# Patient Record
Sex: Male | Born: 1945 | Race: White | Hispanic: No | Smoking: Current every day smoker
Health system: Southern US, Community
[De-identification: ages and names within clinical notes are randomized; demographics above are authoritative.]

## PROBLEM LIST (undated history)

## (undated) DIAGNOSIS — G43909 Migraine, unspecified, not intractable, without status migrainosus: Secondary | ICD-10-CM

## (undated) DIAGNOSIS — K08199 Complete loss of teeth due to other specified cause, unspecified class: Secondary | ICD-10-CM

## (undated) DIAGNOSIS — I2699 Other pulmonary embolism without acute cor pulmonale: Secondary | ICD-10-CM

## (undated) DIAGNOSIS — I00 Rheumatic fever without heart involvement: Secondary | ICD-10-CM

## (undated) DIAGNOSIS — M779 Enthesopathy, unspecified: Secondary | ICD-10-CM

## (undated) DIAGNOSIS — M719 Bursopathy, unspecified: Secondary | ICD-10-CM

## (undated) DIAGNOSIS — Z8679 Personal history of other diseases of the circulatory system: Secondary | ICD-10-CM

## (undated) DIAGNOSIS — H269 Unspecified cataract: Secondary | ICD-10-CM

## (undated) DIAGNOSIS — N200 Calculus of kidney: Secondary | ICD-10-CM

## (undated) DIAGNOSIS — R17 Unspecified jaundice: Secondary | ICD-10-CM

## (undated) HISTORY — DX: Unspecified jaundice: R17

## (undated) HISTORY — DX: Personal history of other diseases of the circulatory system: Z86.79

## (undated) HISTORY — PX: ABDOMINAL SURGERY: SHX537

## (undated) HISTORY — PX: EYE SURGERY: SHX253

## (undated) HISTORY — DX: Unspecified cataract: H26.9

## (undated) HISTORY — DX: Calculus of kidney: N20.0

## (undated) HISTORY — DX: Other pulmonary embolism without acute cor pulmonale: I26.99

## (undated) HISTORY — PX: CHOLECYSTECTOMY: SHX55

## (undated) HISTORY — PX: CATARACT EXTRACTION, BILATERAL: SHX1313

---

## 1999-11-03 ENCOUNTER — Emergency Department (HOSPITAL_COMMUNITY): Admission: EM | Admit: 1999-11-03 | Discharge: 1999-11-03 | Payer: Self-pay | Admitting: Emergency Medicine

## 1999-11-04 ENCOUNTER — Inpatient Hospital Stay (HOSPITAL_COMMUNITY): Admission: EM | Admit: 1999-11-04 | Discharge: 1999-11-09 | Payer: Self-pay | Admitting: Emergency Medicine

## 1999-11-04 ENCOUNTER — Encounter: Payer: Self-pay | Admitting: Otolaryngology

## 2004-12-06 ENCOUNTER — Ambulatory Visit (HOSPITAL_COMMUNITY): Admission: RE | Admit: 2004-12-06 | Discharge: 2004-12-06 | Payer: Self-pay | Admitting: Family Medicine

## 2007-04-02 ENCOUNTER — Ambulatory Visit (HOSPITAL_COMMUNITY): Admission: RE | Admit: 2007-04-02 | Discharge: 2007-04-02 | Payer: Self-pay | Admitting: Family Medicine

## 2007-04-13 ENCOUNTER — Ambulatory Visit (HOSPITAL_COMMUNITY): Admission: RE | Admit: 2007-04-13 | Discharge: 2007-04-13 | Payer: Self-pay | Admitting: Urology

## 2007-04-27 ENCOUNTER — Ambulatory Visit (HOSPITAL_COMMUNITY): Admission: RE | Admit: 2007-04-27 | Discharge: 2007-04-27 | Payer: Self-pay | Admitting: Urology

## 2007-06-02 ENCOUNTER — Ambulatory Visit (HOSPITAL_COMMUNITY): Admission: RE | Admit: 2007-06-02 | Discharge: 2007-06-02 | Payer: Self-pay | Admitting: Urology

## 2007-06-04 ENCOUNTER — Ambulatory Visit (HOSPITAL_COMMUNITY): Admission: RE | Admit: 2007-06-04 | Discharge: 2007-06-04 | Payer: Self-pay | Admitting: Urology

## 2007-06-18 ENCOUNTER — Ambulatory Visit (HOSPITAL_COMMUNITY): Admission: RE | Admit: 2007-06-18 | Discharge: 2007-06-18 | Payer: Self-pay | Admitting: Urology

## 2008-06-02 ENCOUNTER — Ambulatory Visit (HOSPITAL_COMMUNITY): Admission: RE | Admit: 2008-06-02 | Discharge: 2008-06-02 | Payer: Self-pay | Admitting: Family Medicine

## 2011-03-18 NOTE — H&P (Signed)
Brian Stark, Brian Stark               ACCOUNT NO.:  [id number]   MEDICAL RECORD NO.:  [id number]          PATIENT TYPE:  AMB   LOCATION:  DAY                           FACILITY:  [facility]   PHYSICIAN:  Dennie Maizes, M.D.   DATE OF BIRTH:  [birth year]   DATE OF ADMISSION:  06/02/2007  DATE OF DISCHARGE:  LH                              HISTORY & PHYSICAL   CHIEF COMPLAINT:  Intermittent hematuria, left renal calculus.   HISTORY OF PRESENT ILLNESS:  This 65 year old male was referred to me by  Dr. Renard Matter.  He had been having intermittent mild hematuria since  August 2007.  He was seen in the office in June 2008.  Complete  evaluation was done for hematuria workup.  CT scan of the abdomen and  pelvis revealed a 7 x 5 mm size left renal calculus without obstruction.  Cystoscopy was negative for bladder lesions.  The patient is brought to  the short stay center today for extracorporeal shock wave lithotripsy of  a large left renal calculus.   He has occasional dysuria.  He has urinary frequency q.2 h., and  nocturia q.1 h.  He has good urinary flow.  There is nohematuria.Marland Kitchen   PAST MEDICAL HISTORY:  1. Rheumatic fever as a child.  The patient does not have rheumatic      heart disease.  2. History of epistaxis.  3. Cholelithiasis and choledocholithiasis, status post      cholecystectomy, status post choledocholithotomy.   MEDICATIONS:  None.   ALLERGIES:  FLOXIN.   FAMILY HISTORY:  Positive for heart disease, prostate disease.   PHYSICAL EXAMINATION:  HEENT:  Normal.  NECK:  No masses.  LUNGS:  Clear to auscultation.  HEART:  Regular rate and rhythm.  No murmurs.  ABDOMEN:  Soft.  No palpable flank mass.  No costovertebral angle  tenderness.  Bladder not palpable.  GU:  Penis and testes are normal.  RECTAL:  35 g, benign prostate.   IMPRESSION:  Left ureteral calculus, hematuria.   PLAN:  Extracorporeal shock wave lithotripsy of left distal ureteral  calculus with IV sedation in  day hospital.  I have informed the patient  regarding the diagnosis, operative details, alternative treatments,  outcome, possible risks and complications, and he has agreed for the  procedure to be done.      Dennie Maizes, M.D.  Electronically Signed     SK/MEDQ  D:  06/02/2007  T:  06/02/2007  Job:  [id number]   cc:   Angus G. Renard Matter, MD  Fax: [phone]

## 2014-08-25 ENCOUNTER — Other Ambulatory Visit (HOSPITAL_COMMUNITY): Payer: Self-pay | Admitting: Family Medicine

## 2014-08-25 ENCOUNTER — Ambulatory Visit (HOSPITAL_COMMUNITY)
Admission: RE | Admit: 2014-08-25 | Discharge: 2014-08-25 | Disposition: A | Discharge status: Home / Self Care | Payer: Medicare Other | Source: Ambulatory Visit | Attending: Family Medicine | Admitting: Family Medicine

## 2014-08-25 DIAGNOSIS — M25512 Pain in left shoulder: Secondary | ICD-10-CM | POA: Diagnosis present

## 2014-11-02 ENCOUNTER — Other Ambulatory Visit (HOSPITAL_COMMUNITY): Payer: Self-pay | Admitting: Family Medicine

## 2014-11-02 DIAGNOSIS — M25512 Pain in left shoulder: Secondary | ICD-10-CM

## 2014-11-09 ENCOUNTER — Ambulatory Visit (HOSPITAL_COMMUNITY)
Admission: RE | Admit: 2014-11-09 | Discharge: 2014-11-09 | Disposition: A | Discharge status: Home / Self Care | Payer: Medicare Other | Source: Ambulatory Visit | Attending: Family Medicine | Admitting: Family Medicine

## 2014-11-09 DIAGNOSIS — M25512 Pain in left shoulder: Secondary | ICD-10-CM | POA: Diagnosis not present

## 2014-11-09 DIAGNOSIS — M779 Enthesopathy, unspecified: Secondary | ICD-10-CM | POA: Insufficient documentation

## 2014-11-09 DIAGNOSIS — R531 Weakness: Secondary | ICD-10-CM | POA: Insufficient documentation

## 2014-12-18 ENCOUNTER — Ambulatory Visit: Payer: Medicare Other | Admitting: Orthopedic Surgery

## 2015-01-02 ENCOUNTER — Ambulatory Visit (INDEPENDENT_AMBULATORY_CARE_PROVIDER_SITE_OTHER): Payer: Medicare Other | Admitting: Orthopedic Surgery

## 2015-01-02 VITALS — BP 124/77 | Ht 70.0 in | Wt 165.0 lb

## 2015-01-02 DIAGNOSIS — M7552 Bursitis of left shoulder: Secondary | ICD-10-CM

## 2015-01-02 DIAGNOSIS — M75102 Unspecified rotator cuff tear or rupture of left shoulder, not specified as traumatic: Secondary | ICD-10-CM

## 2015-01-02 NOTE — Progress Notes (Signed)
Brian Stark is a 69 y.o. male   Chief Complaint  Patient presents with  . Shoulder Pain    left shoulder pain, DOI 09/02/13 FALL    The patient has a history of shoulder pain since October 2014 he says I wonder if it's 2015. He does have a history of a fall back at that time and current symptoms include pain and locking of the left shoulder. Pain is described as dull, throbbing, aching and radiating into his left upper extremity. Constant 6 out of 10 pain he's had x-ray and MRI did not show tear just some bursitis and before meals joint arthrosis. Pain is unrelieved by [facility] powders and 800 mg ibuprofen. Pain increases with activity such as lifting his arm over his head during [employer] sleeping driving and cold temperatures.  I reviewed his MRI and x-ray he does not have a cuff tear. Tendinitis rotator cuff and tendinopathy noted. Bursitis.  System review reveals dental problems fatigue cold intolerance excessive nighttime urination bladder control issues frequent urination joint pain and back pain  No known drug allergies  Medical history of asthma pneumonia liver disease heart disease kidney disease rheumatic fever  Surgery 1981 had gallbladder surgery for stones 1957 had plastic surgery 2003 had kidney stones pulverized  Medications recorded  Social history smoke 1 pack per day beer daily retired no street drugs  Family history diabetes asthma heart disease cancer arthritis osteoporosis seizures    Shoulder Pain: No past medical history on file. No past surgical history on file.  Current outpatient prescriptions:  .  Aspirin-Salicylamide-Caffeine (BC HEADACHE POWDER PO), Take by mouth., Disp: , Rfl:  .  ibuprofen (ADVIL,MOTRIN) 800 MG tablet, Take 800 mg by mouth every 8 (eight) hours as needed., Disp: , Rfl:  .  tadalafil (CIALIS) 20 MG tablet, Take 20 mg by mouth daily as needed for erectile dysfunction., Disp: , Rfl:  No Known Allergies   No family history on  file.   BP 124/77 mmHg  Ht  (1.778 m)  Wt 165 lb (74.844 kg)  BMI 23.68 kg/m2 9awake alert oriented 3 appearance normal sprain small mood normal affect normal gait station normal  Right Shoulder: Inspection reveals no abnormalities, atrophy or asymmetry. Palpation is normal with no tenderness over AC joint or bicipital groove. ROM is full in all planes. Rotator cuff strength normal throughout. No signs of impingement with negative Neer and Hawkin's tests, empty can. Speeds and Yergason's tests normal. No labral pathology noted with negative Obrien's, negative clunk and good stability. Normal scapular function observed. No painful arc and no drop arm sign. No apprehension sign.  Left shoulder Tenderness in the peri-acromial region especially in the rotator interval and posterior subacromial space. Full range of motion some tightness with internal rotation but it matches his opposite side for [facility] elevation with grade 5 strength in all muscles of the rotator cuff. Apprehens Skin normal. Pulse normal. Sensation normal.  Lymph nodes normal both arms.   Diagnosis left rotator cuff syndrome  Recommend subacromial injection and physical therapy at home  Procedure note the subacromial injection shoulder left   Verbal consent was obtained to inject the  Left   Shoulder  Timeout was completed to confirm the injection site is a subacromial space of the  left  shoulder  Medication used Depo-Medrol 40 mg and lidocaine 1% 3 cc  Anesthesia was provided by ethyl chloride  The injection was performed in the left  posterior subacromial space. After pinning the  skin with alcohol and anesthetized the skin with ethyl chloride the subacromial space was injected using a 20-gauge needle. There were no complications  Sterile dressing was applied.   If he does not feel relief after 2 weeks he is to call to get a repeat injection. Surgery not needed        Subacromial Shoulder  Injection Procedure Note  Pre-operative Diagnosis: left subacromial rotator cuff syndrome  Post-operative Diagnosis: same  Indications: Pain   Complications:  None; patient tolerated the procedure well.

## 2015-01-02 NOTE — Patient Instructions (Signed)
Home exercises   Joint Injection Care After Refer to this sheet in the next few days. These instructions provide you with information on caring for yourself after you have had a joint injection. Your caregiver also may give you more specific instructions. Your treatment has been planned according to current medical practices, but problems sometimes occur. Call your caregiver if you have any problems or questions after your procedure. After any type of joint injection, it is not uncommon to experience:  Soreness, swelling, or bruising around the injection site.  Mild numbness, tingling, or weakness around the injection site caused by the numbing medicine used before or with the injection. It also is possible to experience the following effects associated with the specific agent after injection:  Iodine-based contrast agents:  Allergic reaction (itching, hives, widespread redness, and swelling beyond the injection site).  Corticosteroids (These effects are rare.):  Allergic reaction.  Increased blood sugar levels (If you have diabetes and you notice that your blood sugar levels have increased, notify your caregiver).  Increased blood pressure levels.  Mood swings.  Hyaluronic acid in the use of viscosupplementation.  Temporary heat or redness.  Temporary rash and itching.  Increased fluid accumulation in the injected joint. These effects all should resolve within a day after your procedure.  HOME CARE INSTRUCTIONS  Limit yourself to light activity the day of your procedure. Avoid lifting heavy objects, bending, stooping, or twisting.  Take prescription or over-the-counter pain medication as directed by your caregiver.  You may apply ice to your injection site to reduce pain and swelling the day of your procedure. Ice may be applied 03-04 times:  Put ice in a plastic bag.  Place a towel between your skin and the bag.  Leave the ice on for no longer than 15-20 minutes each  time. SEEK IMMEDIATE MEDICAL CARE IF:   Pain and swelling get worse rather than better or extend beyond the injection site.  Numbness does not go away.  Blood or fluid continues to leak from the injection site.  You have chest pain.  You have swelling of your face or tongue.  You have trouble breathing or you become dizzy.  You develop a fever, chills, or severe tenderness at the injection site that last longer than 1 day. MAKE SURE YOU:  Understand these instructions.  Watch your condition.  Get help right away if you are not doing well or if you get worse. Document Released: 07/03/2011 Document Revised: 01/12/2012 Document Reviewed: 07/03/2011 Exit[employer] Patient Information 2015 [employer]. This information is not intended to replace advice given to you by your health care provider. Make sure you discuss any questions you have with your health care provider.  

## 2015-01-23 ENCOUNTER — Ambulatory Visit (INDEPENDENT_AMBULATORY_CARE_PROVIDER_SITE_OTHER): Payer: Medicare Other | Admitting: Orthopedic Surgery

## 2015-01-23 VITALS — BP 137/78 | Ht 70.0 in | Wt 165.0 lb

## 2015-01-23 DIAGNOSIS — M7552 Bursitis of left shoulder: Secondary | ICD-10-CM

## 2015-01-23 NOTE — Progress Notes (Signed)
Follow-up visit  This 69 year old male fell on October 2014 eventually had an MRI and x-ray of his left shoulder which showed he had bursitis without rotator cuff tear I gave him an injection last visit on I believe March 1 he got partial relief then he had to drive his [place] in his pain started to worsen again especially last Friday when it was almost unbearable  He presents for repeat injection    site confirmation was confirmed subacromial space left shoulder was injected  Procedure note the subacromial injection shoulder left   Verbal consent was obtained to inject the  Left   Shoulder  Timeout was completed to confirm the injection site is a subacromial space of the  left  shoulder  Medication used Depo-Medrol 40 mg and lidocaine 1% 3 cc  Anesthesia was provided by ethyl chloride  The injection was performed in the left  posterior subacromial space. After pinning the skin with alcohol and anesthetized the skin with ethyl chloride the subacromial space was injected using a 20-gauge needle. There were no complications  Sterile dressing was applied.

## 2017-05-08 ENCOUNTER — Other Ambulatory Visit (HOSPITAL_COMMUNITY): Payer: Self-pay | Admitting: Internal Medicine

## 2017-05-08 DIAGNOSIS — R160 Hepatomegaly, not elsewhere classified: Secondary | ICD-10-CM

## 2017-05-25 ENCOUNTER — Ambulatory Visit (HOSPITAL_COMMUNITY)
Admission: RE | Admit: 2017-05-25 | Discharge: 2017-05-25 | Disposition: A | Discharge status: Home / Self Care | Payer: Medicare Other | Source: Ambulatory Visit | Attending: Internal Medicine | Admitting: Internal Medicine

## 2017-05-25 DIAGNOSIS — K838 Other specified diseases of biliary tract: Secondary | ICD-10-CM | POA: Diagnosis not present

## 2017-05-25 DIAGNOSIS — Z9049 Acquired absence of other specified parts of digestive tract: Secondary | ICD-10-CM | POA: Diagnosis not present

## 2017-05-25 DIAGNOSIS — R160 Hepatomegaly, not elsewhere classified: Secondary | ICD-10-CM | POA: Diagnosis present

## 2018-05-21 ENCOUNTER — Other Ambulatory Visit: Payer: Self-pay | Admitting: Internal Medicine

## 2018-05-21 DIAGNOSIS — R51 Headache: Secondary | ICD-10-CM

## 2018-05-21 DIAGNOSIS — G43911 Migraine, unspecified, intractable, with status migrainosus: Secondary | ICD-10-CM

## 2018-05-21 DIAGNOSIS — G43009 Migraine without aura, not intractable, without status migrainosus: Secondary | ICD-10-CM

## 2018-05-21 DIAGNOSIS — R519 Headache, unspecified: Secondary | ICD-10-CM

## 2018-05-24 ENCOUNTER — Encounter (HOSPITAL_COMMUNITY): Payer: Self-pay

## 2018-05-24 ENCOUNTER — Ambulatory Visit (HOSPITAL_COMMUNITY)
Admission: RE | Admit: 2018-05-24 | Discharge: 2018-05-24 | Disposition: A | Discharge status: Home / Self Care | Payer: Medicare Other | Source: Ambulatory Visit | Attending: Internal Medicine | Admitting: Internal Medicine

## 2018-05-24 DIAGNOSIS — I6782 Cerebral ischemia: Secondary | ICD-10-CM | POA: Diagnosis not present

## 2018-05-24 DIAGNOSIS — G43009 Migraine without aura, not intractable, without status migrainosus: Secondary | ICD-10-CM | POA: Diagnosis not present

## 2018-05-24 DIAGNOSIS — G3189 Other specified degenerative diseases of nervous system: Secondary | ICD-10-CM | POA: Diagnosis not present

## 2018-05-24 DIAGNOSIS — R51 Headache: Secondary | ICD-10-CM

## 2018-05-24 DIAGNOSIS — R519 Headache, unspecified: Secondary | ICD-10-CM

## 2018-05-24 LAB — POCT I-STAT CREATININE: CREATININE: 1.6 mg/dL — AB (ref 0.61–1.24)

## 2018-05-24 MED ORDER — IOHEXOL 300 MG/ML  SOLN
75.0000 mL | Freq: Once | INTRAMUSCULAR | Status: AC | PRN
  Administered 2018-05-24: 60 mL via INTRAVENOUS

## 2018-09-05 ENCOUNTER — Encounter (HOSPITAL_COMMUNITY): Payer: Self-pay | Admitting: Emergency Medicine

## 2018-09-05 ENCOUNTER — Other Ambulatory Visit: Payer: Self-pay

## 2018-09-05 ENCOUNTER — Inpatient Hospital Stay (HOSPITAL_COMMUNITY)
Admission: EM | Admit: 2018-09-05 | Discharge: 2018-09-16 | DRG: 025 | Disposition: A | Discharge status: Skilled Nursing Facility | Payer: Medicare Other | Attending: Neurosurgery | Admitting: Neurosurgery

## 2018-09-05 ENCOUNTER — Emergency Department (HOSPITAL_COMMUNITY): Payer: Medicare Other

## 2018-09-05 DIAGNOSIS — R51 Headache: Secondary | ICD-10-CM | POA: Diagnosis not present

## 2018-09-05 DIAGNOSIS — G935 Compression of brain: Secondary | ICD-10-CM | POA: Diagnosis present

## 2018-09-05 DIAGNOSIS — W1830XA Fall on same level, unspecified, initial encounter: Secondary | ICD-10-CM | POA: Diagnosis present

## 2018-09-05 DIAGNOSIS — G8194 Hemiplegia, unspecified affecting left nondominant side: Secondary | ICD-10-CM | POA: Diagnosis present

## 2018-09-05 DIAGNOSIS — S065X9A Traumatic subdural hemorrhage with loss of consciousness of unspecified duration, initial encounter: Principal | ICD-10-CM | POA: Diagnosis present

## 2018-09-05 DIAGNOSIS — S065XAA Traumatic subdural hemorrhage with loss of consciousness status unknown, initial encounter: Secondary | ICD-10-CM | POA: Diagnosis present

## 2018-09-05 DIAGNOSIS — H5702 Anisocoria: Secondary | ICD-10-CM | POA: Diagnosis present

## 2018-09-05 DIAGNOSIS — F17200 Nicotine dependence, unspecified, uncomplicated: Secondary | ICD-10-CM | POA: Diagnosis present

## 2018-09-05 DIAGNOSIS — I6200 Nontraumatic subdural hemorrhage, unspecified: Secondary | ICD-10-CM | POA: Diagnosis present

## 2018-09-05 HISTORY — DX: Bursopathy, unspecified: M71.9

## 2018-09-05 HISTORY — DX: Rheumatic fever without heart involvement: I00

## 2018-09-05 HISTORY — DX: Enthesopathy, unspecified: M77.9

## 2018-09-05 HISTORY — DX: Migraine, unspecified, not intractable, without status migrainosus: G43.909

## 2018-09-05 LAB — COMPREHENSIVE METABOLIC PANEL
ALT: 14 U/L (ref 0–44)
ANION GAP: 8 (ref 5–15)
AST: 28 U/L (ref 15–41)
Albumin: 3.8 g/dL (ref 3.5–5.0)
Alkaline Phosphatase: 82 U/L (ref 38–126)
BUN: 10 mg/dL (ref 8–23)
CHLORIDE: 107 mmol/L (ref 98–111)
CO2: 25 mmol/L (ref 22–32)
Calcium: 8.8 mg/dL — ABNORMAL LOW (ref 8.9–10.3)
Creatinine, Ser: 1.33 mg/dL — ABNORMAL HIGH (ref 0.61–1.24)
GFR, EST NON AFRICAN AMERICAN: 52 mL/min — AB (ref >60)
Glucose, Bld: 94 mg/dL (ref 70–99)
POTASSIUM: 3.7 mmol/L (ref 3.5–5.1)
Sodium: 140 mmol/L (ref 135–145)
TOTAL PROTEIN: 6.7 g/dL (ref 6.5–8.1)
Total Bilirubin: 1.1 mg/dL (ref 0.3–1.2)

## 2018-09-05 LAB — URINALYSIS, MICROSCOPIC (REFLEX)
Bacteria, UA: NONE SEEN
RBC / HPF: NONE SEEN RBC/hpf (ref 0–5)
SQUAMOUS EPITHELIAL / LPF: NONE SEEN (ref 0–5)
WBC UA: NONE SEEN WBC/hpf (ref 0–5)

## 2018-09-05 LAB — CBC WITH DIFFERENTIAL/PLATELET
Abs Immature Granulocytes: 0.02 10*3/uL (ref 0.00–0.07)
Basophils Absolute: 0 10*3/uL (ref 0.0–0.1)
Basophils Relative: 1 %
EOS PCT: 0 %
Eosinophils Absolute: 0 10*3/uL (ref 0.0–0.5)
HCT: 36.2 % — ABNORMAL LOW (ref 39.0–52.0)
Hemoglobin: 11.7 g/dL — ABNORMAL LOW (ref 13.0–17.0)
IMMATURE GRANULOCYTES: 0 %
Lymphocytes Relative: 8 %
Lymphs Abs: 0.5 10*3/uL — ABNORMAL LOW (ref 0.7–4.0)
MCH: 34.6 pg — ABNORMAL HIGH (ref 26.0–34.0)
MCHC: 32.3 g/dL (ref 30.0–36.0)
MCV: 107.1 fL — AB (ref 80.0–100.0)
MONOS PCT: 10 %
Monocytes Absolute: 0.7 10*3/uL (ref 0.1–1.0)
NEUTROS PCT: 81 %
NRBC: 0 % (ref 0.0–0.2)
Neutro Abs: 5.1 10*3/uL (ref 1.7–7.7)
PLATELETS: 84 10*3/uL — AB (ref 150–400)
RBC: 3.38 MIL/uL — ABNORMAL LOW (ref 4.22–5.81)
RDW: 13.9 % (ref 11.5–15.5)
WBC: 6.3 10*3/uL (ref 4.0–10.5)

## 2018-09-05 LAB — URINALYSIS, ROUTINE W REFLEX MICROSCOPIC
Bilirubin Urine: NEGATIVE
Glucose, UA: NEGATIVE mg/dL
Ketones, ur: NEGATIVE mg/dL
Leukocytes, UA: NEGATIVE
NITRITE: NEGATIVE
Protein, ur: NEGATIVE mg/dL
pH: 6 (ref 5.0–8.0)

## 2018-09-05 LAB — APTT: aPTT: 26 seconds (ref 24–36)

## 2018-09-05 LAB — PROTIME-INR
INR: 1.04
Prothrombin Time: 13.5 seconds (ref 11.4–15.2)

## 2018-09-05 LAB — ETHANOL: Alcohol, Ethyl (B): <10 mg/dL (ref <10)

## 2018-09-05 MED ORDER — SODIUM CHLORIDE 0.9 % IV BOLUS
500.0000 mL | Freq: Once | INTRAVENOUS | Status: AC
  Administered 2018-09-05: 500 mL via INTRAVENOUS

## 2018-09-05 NOTE — ED Triage Notes (Signed)
Someone found pt lying beside his car, pt told EMS that he has been getting dizzy the past 2 days and fell. Was out in cold approx 35 degress for about 20-30 min

## 2018-09-05 NOTE — ED Notes (Signed)
Per EDP pt is to go straight to OR for intervention

## 2018-09-05 NOTE — ED Notes (Signed)
Pt states he has been getting dizzy with standing for past few days. Denies dizziness with sitting. Pt states he drinks 2 beers a night.

## 2018-09-05 NOTE — ED Notes (Signed)
Been kept NPO whole ED visit.

## 2018-09-05 NOTE — ED Provider Notes (Signed)
[facility] EMERGENCY DEPARTMENT Provider Note   CSN: [id number] Arrival date & time: 09/05/18  2140     History   Chief Complaint Chief Complaint  Patient presents with  . Fall  . Dizziness    HPI Brian Stark is a 72 y.o. male.  HPI Patient states he has had increased urinary frequency for the last few days.  This evening had roughly 3 episodes of his legs giving out.  Denied dizziness or lightheadedness.  Denied loss of consciousness.  Thinks he may have hit his head during 1 of the falls.  Denies neck pain.  No focal weakness or numbness.  Denies chest pain or shortness of breath.  No abdominal pain, nausea or vomiting.  No recent fever or chills. Past Medical History:  Diagnosis Date  . Bursitis    l shoulder  . Migraines   . Rheumatic fever   . Tendonitis    L shoulder    There are no active problems to display for this patient.   Past Surgical History:  Procedure Laterality Date  . ABDOMINAL SURGERY    . CATARACT EXTRACTION, BILATERAL    . CHOLECYSTECTOMY    . EYE SURGERY          Home Medications    Prior to Admission medications   Medication Sig Start Date End Date Taking? Authorizing Provider  Aspirin-Salicylamide-Caffeine (BC HEADACHE POWDER PO) Take 1 Package by mouth every 6 (six) hours.    Yes [provider]  cyanocobalamin (,VITAMIN B-12,) 1000 MCG/ML injection Inject 1,000 mcg into the muscle every 30 (thirty) days.   Yes [provider]  Homeopathic Products (LEG CRAMP RELIEF PO) Take 1 tablet by mouth daily as needed (Leg cramp).   Yes [provider]  ibuprofen (ADVIL,MOTRIN) 800 MG tablet Take 800 mg by mouth every 8 (eight) hours as needed.   Yes [provider]  Polyethyl Glycol-Propyl Glycol 0.4-0.3 % SOLN Apply 1 drop to eye.   Yes [provider]  tadalafil (CIALIS) 20 MG tablet Take 20 mg by mouth daily as needed for erectile dysfunction.   Yes [provider]    Family  History History reviewed. No pertinent family history.  Social History Social History   Tobacco Use  . Smoking status: Current Every Day Smoker  . Smokeless tobacco: Never Used  Substance Use Topics  . Alcohol use: Yes    Comment: daily-beer 2 cans  . Drug use: Never     Allergies   Flucloxacillin   Review of Systems Review of Systems  Constitutional: Negative for chills and fever.  HENT: Negative for trouble swallowing.   Eyes: Negative for visual disturbance.  Respiratory: Negative for cough and shortness of breath.   Cardiovascular: Negative for chest pain, palpitations and leg swelling.  Gastrointestinal: Negative for abdominal pain, constipation, diarrhea, nausea and vomiting.  Genitourinary: Positive for frequency. Negative for dysuria, flank pain and hematuria.  Musculoskeletal: Negative for back pain, myalgias and neck pain.  Skin: Negative for rash and wound.  Neurological: Positive for weakness. Negative for dizziness, syncope, speech difficulty, light-headedness, numbness and headaches.  All other systems reviewed and are negative.    Physical Exam Updated Vital Signs BP (!) 148/94   Pulse 87   Temp 98.2 F (36.8 C) (Oral)   Resp (!) 22   SpO2 97%   Physical Exam  Constitutional: He is oriented to person, place, and time. He appears well-developed and well-nourished. No distress.  HENT:  Head: Normocephalic  and atraumatic.  Mouth/Throat: Oropharynx is clear and moist.  No obvious head injury.  No intraoral injury.  Patient has right eye ptosis  Eyes: Pupils are equal, round, and reactive to light. EOM are normal.  Neck: Normal range of motion. Neck supple.  No posterior midline cervical tenderness to palpation.  Cardiovascular: Normal rate and regular rhythm. Exam reveals no gallop and no friction rub.  No murmur heard. Pulmonary/Chest: Effort normal and breath sounds normal. No stridor. No respiratory distress. He has no wheezes. He has no rales. He  exhibits no tenderness.  Abdominal: Soft. Bowel sounds are normal. There is no tenderness. There is no rebound and no guarding.  Musculoskeletal: Normal range of motion. He exhibits no edema or tenderness.  Neurological: He is alert and oriented to person, place, and time.  Patient is alert and oriented x3 with clear, goal oriented speech. Patient has 5/5 motor in all extremities. Sensation is intact to light touch. Bilateral finger-to-nose is normal with no signs of dysmetria.   Skin: Skin is warm and dry. No rash noted. He is not diaphoretic. No erythema.  Psychiatric: He has a normal mood and affect. His behavior is normal.  Nursing note and vitals reviewed.    ED Treatments / Results  Labs (all labs ordered are listed, but only abnormal results are displayed) Labs Reviewed  CBC WITH DIFFERENTIAL/PLATELET - Abnormal; Notable for the following components:      Result Value   RBC 3.38 (*)    Hemoglobin 11.7 (*)    HCT 36.2 (*)    MCV 107.1 (*)    MCH 34.6 (*)    Platelets 84 (*)    Lymphs Abs 0.5 (*)    All other components within normal limits  COMPREHENSIVE METABOLIC PANEL - Abnormal; Notable for the following components:   Creatinine, Ser 1.33 (*)    Calcium 8.8 (*)    GFR calc non Af Amer 52 (*)    All other components within normal limits  ETHANOL  PROTIME-INR  APTT  URINALYSIS, ROUTINE W REFLEX MICROSCOPIC  RAPID URINE DRUG SCREEN, HOSP PERFORMED  TYPE AND SCREEN    EKG EKG Interpretation  Date/Time:  Sunday September 05 2018 21:50:43 EST Ventricular Rate:  85 PR Interval:    QRS Duration: 92 QT Interval:  388 QTC Calculation: 462 R Axis:   -59 Text Interpretation:  Sinus rhythm Left anterior fascicular block Abnormal R-wave progression, early transition Confirmed by Loren Racer ([id number]) on 09/05/2018 10:29:55 PM   Radiology Ct Head Wo Contrast  Result Date: 09/05/2018 CLINICAL DATA:  Fall. Dizziness. EXAM: CT HEAD WITHOUT CONTRAST TECHNIQUE: Contiguous  axial images were obtained from the base of the skull through the vertex without intravenous contrast. COMPARISON:  05/24/2018 FINDINGS: Brain: Subdural hematomas are present over both cerebral convexities measuring up to 2.6 cm in thickness on the right and 1.5 cm on the left. Layering hyperdense blood products are noted in both collections posteriorly. There is associated sulcal and partial ventricular effacement with 10 mm of leftward midline shift. No acute infarct is identified. Scattered cerebral white matter hypodensities are nonspecific but compatible with minimal chronic small vessel ischemic disease for age. Vascular: Mild calcified atherosclerosis at the skull base. No hyperdense vessel. Skull: No fracture or focal osseous lesion. Sinuses/Orbits: Visualized paranasal sinuses and mastoid air cells are clear. Bilateral cataract extraction is noted. Other: None. IMPRESSION: Bilateral subdural hematomas with 10 mm of leftward midline shift. Critical Value/emergent results were called by telephone at the time  of interpretation on 09/05/2018 at 11:11 pm to Dr. Loren Racer , who verbally acknowledged these results. Electronically Signed   By: Sebastian Ache M.D.   On: 09/05/2018 23:13    Procedures Procedures (including critical care time)  Medications Ordered in ED Medications  sodium chloride 0.9 % bolus 500 mL (500 mLs Intravenous New Bag/Given 09/05/18 2333)    CRITICAL CARE Performed by: Loren Racer Total critical care time: 35 minutes Critical care time was exclusive of separately billable procedures and treating other patients. Critical care was necessary to treat or prevent imminent or life-threatening deterioration. Critical care was time spent personally by me on the following activities: development of treatment plan with patient and/or surrogate as well as nursing, discussions with consultants, evaluation of patient's response to treatment, examination of patient, obtaining  history from patient or surrogate, ordering and performing treatments and interventions, ordering and review of laboratory studies, ordering and review of radiographic studies, pulse oximetry and re-evaluation of patient's condition. Initial Impression / Assessment and Plan / ED Course  I have reviewed the triage vital signs and the nursing notes.  Pertinent labs & imaging results that were available during my care of the patient were reviewed by me and considered in my medical decision making (see chart for details).     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  CT head with bilateral subdurals and left to right shift.  Spoke with Dr. Venetia Maxon who reviewed patient's CT.  Is planning to take the patient straight to the operating room.  Is concerned that the patient's platelets are low.  Asked to have patient typed and screened and will likely give platelets at [facility].  Discussed all this with the patient and his son.  His son is in [facility] and will drive to [employer].  Will arrange transfer. Final Clinical Impressions(s) / ED Diagnoses   Final diagnoses:  Subdural hemorrhage [facility])    ED Discharge Orders    None       Loren Racer, MD 09/05/18 2351

## 2018-09-05 NOTE — ED Notes (Signed)
Pt taken to ct 

## 2018-09-06 ENCOUNTER — Emergency Department (HOSPITAL_COMMUNITY): Payer: Medicare Other | Admitting: Certified Registered"

## 2018-09-06 ENCOUNTER — Inpatient Hospital Stay (HOSPITAL_COMMUNITY)
Admission: EM | Disposition: A | Discharge status: Skilled Nursing Facility | Payer: Self-pay | Source: Home / Self Care | Attending: Neurosurgery

## 2018-09-06 ENCOUNTER — Encounter (HOSPITAL_COMMUNITY): Payer: Self-pay | Admitting: Certified Registered"

## 2018-09-06 DIAGNOSIS — S065X9A Traumatic subdural hemorrhage with loss of consciousness of unspecified duration, initial encounter: Secondary | ICD-10-CM | POA: Diagnosis present

## 2018-09-06 DIAGNOSIS — R51 Headache: Secondary | ICD-10-CM | POA: Diagnosis present

## 2018-09-06 DIAGNOSIS — W1830XA Fall on same level, unspecified, initial encounter: Secondary | ICD-10-CM | POA: Diagnosis present

## 2018-09-06 DIAGNOSIS — G8194 Hemiplegia, unspecified affecting left nondominant side: Secondary | ICD-10-CM | POA: Diagnosis present

## 2018-09-06 DIAGNOSIS — H5702 Anisocoria: Secondary | ICD-10-CM | POA: Diagnosis present

## 2018-09-06 DIAGNOSIS — G935 Compression of brain: Secondary | ICD-10-CM | POA: Diagnosis present

## 2018-09-06 DIAGNOSIS — I6200 Nontraumatic subdural hemorrhage, unspecified: Secondary | ICD-10-CM | POA: Diagnosis present

## 2018-09-06 DIAGNOSIS — S065XAA Traumatic subdural hemorrhage with loss of consciousness status unknown, initial encounter: Secondary | ICD-10-CM | POA: Diagnosis present

## 2018-09-06 DIAGNOSIS — F17200 Nicotine dependence, unspecified, uncomplicated: Secondary | ICD-10-CM | POA: Diagnosis present

## 2018-09-06 HISTORY — PX: CRANIOTOMY: SHX93

## 2018-09-06 LAB — RAPID URINE DRUG SCREEN, HOSP PERFORMED
AMPHETAMINES: NOT DETECTED
Barbiturates: NOT DETECTED
Benzodiazepines: NOT DETECTED
COCAINE: NOT DETECTED
OPIATES: POSITIVE — AB
TETRAHYDROCANNABINOL: NOT DETECTED

## 2018-09-06 LAB — CBC
HCT: 33.3 % — ABNORMAL LOW (ref 39.0–52.0)
Hemoglobin: 10.9 g/dL — ABNORMAL LOW (ref 13.0–17.0)
MCH: 34.8 pg — ABNORMAL HIGH (ref 26.0–34.0)
MCHC: 32.7 g/dL (ref 30.0–36.0)
MCV: 106.4 fL — ABNORMAL HIGH (ref 80.0–100.0)
NRBC: 0 % (ref 0.0–0.2)
PLATELETS: 115 10*3/uL — AB (ref 150–400)
RBC: 3.13 MIL/uL — ABNORMAL LOW (ref 4.22–5.81)
RDW: 13.8 % (ref 11.5–15.5)
WBC: 5.1 10*3/uL (ref 4.0–10.5)

## 2018-09-06 LAB — BASIC METABOLIC PANEL
Anion gap: 4 — ABNORMAL LOW (ref 5–15)
BUN: 8 mg/dL (ref 8–23)
CALCIUM: 8.3 mg/dL — AB (ref 8.9–10.3)
CO2: 24 mmol/L (ref 22–32)
Chloride: 113 mmol/L — ABNORMAL HIGH (ref 98–111)
Creatinine, Ser: 1.29 mg/dL — ABNORMAL HIGH (ref 0.61–1.24)
GFR, EST NON AFRICAN AMERICAN: 54 mL/min — AB (ref >60)
Glucose, Bld: 114 mg/dL — ABNORMAL HIGH (ref 70–99)
Potassium: 3.4 mmol/L — ABNORMAL LOW (ref 3.5–5.1)
SODIUM: 141 mmol/L (ref 135–145)

## 2018-09-06 LAB — MRSA PCR SCREENING: MRSA BY PCR: NEGATIVE

## 2018-09-06 LAB — ABO/RH: ABO/RH(D): O POS

## 2018-09-06 SURGERY — CRANIOTOMY HEMATOMA EVACUATION SUBDURAL
Anesthesia: General | Site: Head | Laterality: Bilateral

## 2018-09-06 MED ORDER — ACETAMINOPHEN 650 MG RE SUPP: 650.0000 mg | RECTAL | Status: DC | PRN

## 2018-09-06 MED ORDER — ACETAMINOPHEN 325 MG PO TABS
650.0000 mg | ORAL_TABLET | ORAL | Status: DC | PRN
  Administered 2018-09-06 – 2018-09-14 (×4): 650 mg via ORAL
  Filled 2018-09-06 (×4): qty 2

## 2018-09-06 MED ORDER — FLEET ENEMA 7-19 GM/118ML RE ENEM: 1.0000 | ENEMA | Freq: Once | RECTAL | Status: DC | PRN

## 2018-09-06 MED ORDER — MORPHINE SULFATE (PF) 2 MG/ML IV SOLN
1.0000 mg | INTRAVENOUS | Status: DC | PRN
  Administered 2018-09-07 – 2018-09-10 (×8): 2 mg via INTRAVENOUS
  Administered 2018-09-10 – 2018-09-12 (×2): 1 mg via INTRAVENOUS
  Filled 2018-09-06 (×10): qty 1

## 2018-09-06 MED ORDER — ONDANSETRON HCL 4 MG/2ML IJ SOLN
INTRAMUSCULAR | Status: DC | PRN
  Administered 2018-09-06: 4 mg via INTRAVENOUS

## 2018-09-06 MED ORDER — ONDANSETRON HCL 4 MG/2ML IJ SOLN
4.0000 mg | INTRAMUSCULAR | Status: DC | PRN
  Administered 2018-09-07 – 2018-09-10 (×6): 4 mg via INTRAVENOUS
  Filled 2018-09-06 (×7): qty 2

## 2018-09-06 MED ORDER — VANCOMYCIN HCL IN DEXTROSE 1-5 GM/200ML-% IV SOLN
1000.0000 mg | Freq: Once | INTRAVENOUS | Status: AC
  Administered 2018-09-06: 1000 mg via INTRAVENOUS
  Filled 2018-09-06: qty 200

## 2018-09-06 MED ORDER — THROMBIN 20000 UNITS EX SOLR
CUTANEOUS | Status: DC | PRN
  Administered 2018-09-06: 20 mL via TOPICAL

## 2018-09-06 MED ORDER — PROPOFOL 10 MG/ML IV BOLUS
INTRAVENOUS | Status: DC | PRN
  Administered 2018-09-06: 150 mg via INTRAVENOUS

## 2018-09-06 MED ORDER — POTASSIUM CHLORIDE IN NACL 20-0.9 MEQ/L-% IV SOLN
INTRAVENOUS | Status: DC
  Administered 2018-09-06 – 2018-09-11 (×9): via INTRAVENOUS
  Filled 2018-09-06 (×9): qty 1000

## 2018-09-06 MED ORDER — VANCOMYCIN HCL 1000 MG IV SOLR
INTRAVENOUS | Status: DC | PRN
  Administered 2018-09-06: 1000 mg via INTRAVENOUS

## 2018-09-06 MED ORDER — PROMETHAZINE HCL 25 MG PO TABS: 12.5000 mg | ORAL_TABLET | ORAL | Status: DC | PRN

## 2018-09-06 MED ORDER — LIDOCAINE HCL (CARDIAC) PF 100 MG/5ML IV SOSY
PREFILLED_SYRINGE | INTRAVENOUS | Status: DC | PRN
  Administered 2018-09-06: 100 mg via INTRATRACHEAL

## 2018-09-06 MED ORDER — VANCOMYCIN HCL IN DEXTROSE 1-5 GM/200ML-% IV SOLN
INTRAVENOUS | Status: AC
  Filled 2018-09-06: qty 200

## 2018-09-06 MED ORDER — DOCUSATE SODIUM 100 MG PO CAPS
100.0000 mg | ORAL_CAPSULE | Freq: Two times a day (BID) | ORAL | Status: DC
  Administered 2018-09-06 – 2018-09-16 (×20): 100 mg via ORAL
  Filled 2018-09-06 (×21): qty 1

## 2018-09-06 MED ORDER — ROCURONIUM BROMIDE 100 MG/10ML IV SOLN
INTRAVENOUS | Status: DC | PRN
  Administered 2018-09-06: 50 mg via INTRAVENOUS

## 2018-09-06 MED ORDER — LEVETIRACETAM IN NACL 500 MG/100ML IV SOLN
500.0000 mg | Freq: Two times a day (BID) | INTRAVENOUS | Status: DC
  Administered 2018-09-06 – 2018-09-08 (×4): 500 mg via INTRAVENOUS
  Filled 2018-09-06 (×6): qty 100

## 2018-09-06 MED ORDER — BACITRACIN ZINC 500 UNIT/GM EX OINT
TOPICAL_OINTMENT | CUTANEOUS | Status: DC | PRN
  Administered 2018-09-06: 1 via TOPICAL

## 2018-09-06 MED ORDER — ONDANSETRON HCL 4 MG PO TABS: 4.0000 mg | ORAL_TABLET | ORAL | Status: DC | PRN

## 2018-09-06 MED ORDER — DEXAMETHASONE SODIUM PHOSPHATE 10 MG/ML IJ SOLN
INTRAMUSCULAR | Status: DC | PRN
  Administered 2018-09-06: 10 mg via INTRAVENOUS

## 2018-09-06 MED ORDER — SUFENTANIL CITRATE 50 MCG/ML IV SOLN
INTRAVENOUS | Status: DC | PRN
  Administered 2018-09-06 (×2): 10 ug via INTRAVENOUS

## 2018-09-06 MED ORDER — SUGAMMADEX SODIUM 200 MG/2ML IV SOLN
INTRAVENOUS | Status: DC | PRN
  Administered 2018-09-06: 200 mg via INTRAVENOUS

## 2018-09-06 MED ORDER — LIDOCAINE-EPINEPHRINE 1 %-1:100000 IJ SOLN
INTRAMUSCULAR | Status: DC | PRN
  Administered 2018-09-06: 10 mL via INTRADERMAL

## 2018-09-06 MED ORDER — BISACODYL 10 MG RE SUPP: 10.0000 mg | Freq: Every day | RECTAL | Status: DC | PRN

## 2018-09-06 MED ORDER — SUCCINYLCHOLINE CHLORIDE 20 MG/ML IJ SOLN
INTRAMUSCULAR | Status: DC | PRN
  Administered 2018-09-06: 100 mg via INTRAVENOUS

## 2018-09-06 MED ORDER — THROMBIN (RECOMBINANT) 5000 UNITS EX SOLR
CUTANEOUS | Status: AC
  Filled 2018-09-06: qty 5000

## 2018-09-06 MED ORDER — LABETALOL HCL 5 MG/ML IV SOLN: 10.0000 mg | INTRAVENOUS | Status: DC | PRN

## 2018-09-06 MED ORDER — SODIUM CHLORIDE 0.9 % IV SOLN
INTRAVENOUS | Status: DC | PRN
  Administered 2018-09-06: 50 ug/min via INTRAVENOUS

## 2018-09-06 MED ORDER — CYANOCOBALAMIN 1000 MCG/ML IJ SOLN: 1000.0000 ug | INTRAMUSCULAR | Status: DC

## 2018-09-06 MED ORDER — LACTATED RINGERS IV SOLN
INTRAVENOUS | Status: DC | PRN
  Administered 2018-09-06: 03:00:00 via INTRAVENOUS

## 2018-09-06 MED ORDER — LEG CRAMP RELIEF PO TABS: ORAL_TABLET | Freq: Every day | ORAL | Status: DC | PRN

## 2018-09-06 MED ORDER — BUPIVACAINE HCL (PF) 0.5 % IJ SOLN
INTRAMUSCULAR | Status: DC | PRN
  Administered 2018-09-06: 10 mL

## 2018-09-06 MED ORDER — 0.9 % SODIUM CHLORIDE (POUR BTL) OPTIME
TOPICAL | Status: DC | PRN
  Administered 2018-09-06 (×2): 1000 mL

## 2018-09-06 MED ORDER — HYDROCODONE-ACETAMINOPHEN 5-325 MG PO TABS
1.0000 | ORAL_TABLET | ORAL | Status: DC | PRN
  Administered 2018-09-06 – 2018-09-16 (×41): 1 via ORAL
  Filled 2018-09-06 (×41): qty 1

## 2018-09-06 MED ORDER — BUPIVACAINE-EPINEPHRINE 0.5% -1:200000 IJ SOLN: INTRAMUSCULAR | Status: DC | PRN

## 2018-09-06 MED ORDER — POLYVINYL ALCOHOL 1.4 % OP SOLN: 1.0000 [drp] | Freq: Two times a day (BID) | OPHTHALMIC | Status: DC | PRN

## 2018-09-06 MED ORDER — POLYETHYLENE GLYCOL 3350 17 G PO PACK
17.0000 g | PACK | Freq: Every day | ORAL | Status: DC | PRN
  Administered 2018-09-13: 17 g via ORAL
  Filled 2018-09-06: qty 1

## 2018-09-06 MED ORDER — ORAL CARE MOUTH RINSE
15.0000 mL | Freq: Two times a day (BID) | OROMUCOSAL | Status: DC
  Administered 2018-09-06 – 2018-09-16 (×15): 15 mL via OROMUCOSAL

## 2018-09-06 MED ORDER — SODIUM CHLORIDE 0.9 % IV SOLN
INTRAVENOUS | Status: DC | PRN
  Administered 2018-09-06: 01:00:00 via INTRAVENOUS

## 2018-09-06 MED ORDER — PHENYLEPHRINE HCL 10 MG/ML IJ SOLN
INTRAMUSCULAR | Status: DC | PRN
  Administered 2018-09-06: 120 ug via INTRAVENOUS
  Administered 2018-09-06: 80 ug via INTRAVENOUS

## 2018-09-06 MED ORDER — PANTOPRAZOLE SODIUM 40 MG IV SOLR
40.0000 mg | Freq: Every day | INTRAVENOUS | Status: DC
  Administered 2018-09-06: 40 mg via INTRAVENOUS
  Filled 2018-09-06: qty 40

## 2018-09-06 MED ORDER — THROMBIN 5000 UNITS EX SOLR
OROMUCOSAL | Status: DC | PRN
  Administered 2018-09-06: 5 mL via TOPICAL

## 2018-09-06 SURGICAL SUPPLY — 75 items
BASKET BONE COLLECTION (BASKET) IMPLANT
BIT DRILL WIRE PASS 1.3MM (BIT) IMPLANT
BNDG CMPR 75X41 PLY HI ABS (GAUZE/BANDAGES/DRESSINGS)
BNDG GAUZE ELAST 4 BULKY (GAUZE/BANDAGES/DRESSINGS) IMPLANT
BNDG STRETCH 4X75 STRL LF (GAUZE/BANDAGES/DRESSINGS) IMPLANT
BUR ACORN 6.0 PRECISION (BURR) ×2 IMPLANT
BUR ACORN 6.0MM PRECISION (BURR) ×1
BUR SPIRAL ROUTER 2.3 (BUR) IMPLANT
BUR SPIRAL ROUTER 2.3MM (BUR)
CANISTER SUCT 3000ML PPV (MISCELLANEOUS) ×3 IMPLANT
CARTRIDGE OIL MAESTRO DRILL (MISCELLANEOUS) ×1 IMPLANT
CATH ROBINSON RED A/P 12FR (CATHETERS) IMPLANT
CLIP VESOCCLUDE MED 6/CT (CLIP) IMPLANT
COVER WAND RF STERILE (DRAPES) ×3 IMPLANT
DECANTER SPIKE VIAL GLASS SM (MISCELLANEOUS) ×3 IMPLANT
DIFFUSER DRILL AIR PNEUMATIC (MISCELLANEOUS) ×3 IMPLANT
DRAIN JACKSON PRATT 10MM FLAT (MISCELLANEOUS) ×4 IMPLANT
DRAIN PENROSE 1/2X12 LTX STRL (WOUND CARE) IMPLANT
DRAPE NEUROLOGICAL W/INCISE (DRAPES) ×3 IMPLANT
DRAPE WARM FLUID 44X44 (DRAPE) ×3 IMPLANT
DRILL WIRE PASS 1.3MM (BIT)
DRSG OPSITE POSTOP 4X6 (GAUZE/BANDAGES/DRESSINGS) ×4 IMPLANT
DRSG PAD ABDOMINAL 8X10 ST (GAUZE/BANDAGES/DRESSINGS) IMPLANT
DURAPREP 6ML APPLICATOR 50/CS (WOUND CARE) ×5 IMPLANT
ELECT REM PT RETURN 9FT ADLT (ELECTROSURGICAL) ×3
ELECTRODE REM PT RTRN 9FT ADLT (ELECTROSURGICAL) ×1 IMPLANT
EVACUATOR SILICONE 100CC (DRAIN) ×4 IMPLANT
GAUZE 4X4 16PLY RFD (DISPOSABLE) IMPLANT
GAUZE SPONGE 4X4 12PLY STRL (GAUZE/BANDAGES/DRESSINGS) IMPLANT
GLOVE BIO SURGEON STRL SZ 6.5 (GLOVE) ×1 IMPLANT
GLOVE BIO SURGEON STRL SZ7 (GLOVE) ×6 IMPLANT
GLOVE BIO SURGEON STRL SZ8 (GLOVE) ×1 IMPLANT
GLOVE BIO SURGEONS STRL SZ 6.5 (GLOVE) ×1
GLOVE BIOGEL PI IND STRL 8 (GLOVE) ×1 IMPLANT
GLOVE BIOGEL PI IND STRL 8.5 (GLOVE) ×1 IMPLANT
GLOVE BIOGEL PI INDICATOR 8 (GLOVE)
GLOVE BIOGEL PI INDICATOR 8.5 (GLOVE) ×2
GLOVE ECLIPSE 8.0 STRL XLNG CF (GLOVE) ×3 IMPLANT
GLOVE EXAM NITRILE XL STR (GLOVE) IMPLANT
GOWN STRL REUS W/ TWL LRG LVL3 (GOWN DISPOSABLE) IMPLANT
GOWN STRL REUS W/ TWL XL LVL3 (GOWN DISPOSABLE) IMPLANT
GOWN STRL REUS W/TWL 2XL LVL3 (GOWN DISPOSABLE) IMPLANT
GOWN STRL REUS W/TWL LRG LVL3 (GOWN DISPOSABLE)
GOWN STRL REUS W/TWL XL LVL3 (GOWN DISPOSABLE)
HEMOSTAT SURGICEL 2X14 (HEMOSTASIS) ×3 IMPLANT
KIT BASIN OR (CUSTOM PROCEDURE TRAY) ×3 IMPLANT
KIT TURNOVER KIT B (KITS) ×3 IMPLANT
NDL HYPO 25X1 1.5 SAFETY (NEEDLE) ×1 IMPLANT
NEEDLE HYPO 25X1 1.5 SAFETY (NEEDLE) ×3 IMPLANT
NS IRRIG 1000ML POUR BTL (IV SOLUTION) ×5 IMPLANT
OIL CARTRIDGE MAESTRO DRILL (MISCELLANEOUS) ×3
PACK CRANIOTOMY CUSTOM (CUSTOM PROCEDURE TRAY) ×3 IMPLANT
PAD ARMBOARD 7.5X6 YLW CONV (MISCELLANEOUS) ×7 IMPLANT
PATTIES SURGICAL .5 X.5 (GAUZE/BANDAGES/DRESSINGS) IMPLANT
PATTIES SURGICAL .5 X3 (DISPOSABLE) IMPLANT
PATTIES SURGICAL 1X1 (DISPOSABLE) IMPLANT
PIN MAYFIELD SKULL DISP (PIN) IMPLANT
PLATE 1.5  2HOLE LNG NEURO (Plate) ×12 IMPLANT
PLATE 1.5 2HOLE LNG NEURO (Plate) IMPLANT
SCREW SELF DRILL HT 1.5/4MM (Screw) ×24 IMPLANT
SPECIMEN JAR SMALL (MISCELLANEOUS) IMPLANT
SPONGE NEURO XRAY DETECT 1X3 (DISPOSABLE) IMPLANT
SPONGE SURGIFOAM ABS GEL 100 (HEMOSTASIS) IMPLANT
STAPLER SKIN PROX WIDE 3.9 (STAPLE) ×3 IMPLANT
SUT ETHILON 3 0 FSL (SUTURE) ×4 IMPLANT
SUT ETHILON 3 0 PS 1 (SUTURE) IMPLANT
SUT NURALON 4 0 TR CR/8 (SUTURE) ×4 IMPLANT
SUT VIC AB 2-0 CP2 18 (SUTURE) ×4 IMPLANT
SUT VIC AB 3-0 SH 8-18 (SUTURE) ×2 IMPLANT
SYR CONTROL 10ML LL (SYRINGE) IMPLANT
TOWEL GREEN STERILE (TOWEL DISPOSABLE) ×3 IMPLANT
TOWEL GREEN STERILE FF (TOWEL DISPOSABLE) ×3 IMPLANT
TRAY FOLEY MTR SLVR 16FR STAT (SET/KITS/TRAYS/PACK) IMPLANT
UNDERPAD 30X30 (UNDERPADS AND DIAPERS) IMPLANT
WATER STERILE IRR 1000ML POUR (IV SOLUTION) ×3 IMPLANT

## 2018-09-06 NOTE — Progress Notes (Signed)
Spoke to Neurosurgery MD Venetia Maxon about patient's diet status and whether the patient can have sips with meds. I was told that sips with meds is okay. Will continue to monitor.

## 2018-09-06 NOTE — Anesthesia Preprocedure Evaluation (Signed)
Anesthesia Evaluation  Patient identified by MRN, date of birth, ID band Patient awake    Reviewed: Allergy & Precautions, NPO status , Patient's Chart, lab work & pertinent test results  History of Anesthesia Complications Negative for: history of anesthetic complications  Airway Mallampati: II  TM Distance: >3 FB Neck ROM: Full    Dental no notable dental hx. (+) Dental Advisory Given   Pulmonary Current Smoker,    Pulmonary exam normal        Cardiovascular negative cardio ROS Normal cardiovascular exam     Neuro/Psych negative psych ROS   GI/Hepatic negative GI ROS, Neg liver ROS,   Endo/Other  negative endocrine ROS  Renal/GU Renal InsufficiencyRenal disease  negative genitourinary   Musculoskeletal negative musculoskeletal ROS (+)   Abdominal   Peds negative pediatric ROS (+)  Hematology negative hematology ROS (+)   Anesthesia Other Findings   Reproductive/Obstetrics negative OB ROS                             Anesthesia Physical Anesthesia Plan  ASA: III  Anesthesia Plan: General   Post-op Pain Management:    Induction: Intravenous  PONV Risk Score and Plan: 2 and Ondansetron and Dexamethasone  Airway Management Planned: Oral ETT  Additional Equipment:   Intra-op Plan:   Post-operative Plan: Extubation in OR  Informed Consent: I have reviewed the patients History and Physical, chart, labs and discussed the procedure including the risks, benefits and alternatives for the proposed anesthesia with the patient or authorized representative who has indicated his/her understanding and acceptance.   Dental advisory given  Plan Discussed with: CRNA and Anesthesiologist  Anesthesia Plan Comments:         Anesthesia Quick Evaluation

## 2018-09-06 NOTE — Transfer of Care (Signed)
Immediate Anesthesia Transfer of Care Note  Patient: FINES KIMBERLIN  Procedure(s) Performed: BILATERAL CRANIOTOMIES FOR SUBDURAL HEMATOMA EVACUATION (Bilateral Head)  Patient Location: PACU  Anesthesia Type:General  Level of Consciousness: sedated, drowsy, patient cooperative and responds to stimulation  Airway & Oxygen Therapy: Patient Spontanous Breathing and Patient connected to face mask oxygen  Post-op Assessment: Report given to RN, Post -op Vital signs reviewed and stable and Patient moving all extremities X 4  Post vital signs: Reviewed and stable  Last Vitals:  Vitals Value Taken Time  BP 139/96 09/06/2018  3:52 AM  Temp    Pulse 91 09/06/2018  3:52 AM  Resp 14 09/06/2018  3:52 AM  SpO2 98 % 09/06/2018  3:52 AM  Vitals shown include unvalidated device data.  Last Pain:  Vitals:   09/05/18 2152  TempSrc: Oral  PainSc:          Complications: No apparent anesthesia complications

## 2018-09-06 NOTE — ED Notes (Signed)
[facility] arrived. 

## 2018-09-06 NOTE — Op Note (Signed)
09/06/2018  3:38 AM  PATIENT:  Brian Stark  72 y.o. male  PRE-OPERATIVE DIAGNOSIS:  Bilateral Subdural Hematoma  POST-OPERATIVE DIAGNOSIS:  Bilateral Subdural Hematoma  PROCEDURE:  Procedure(s): BILATERAL CRANIOTOMIES FOR SUBDURAL HEMATOMA EVACUATION (Bilateral)  SURGEON:  Surgeon(s) and Role:    Maeola Harman, MD - Primary  PHYSICIAN ASSISTANT:   ASSISTANTS: none   ANESTHESIA:   general  EBL:  100 mL   BLOOD ADMINISTERED:none  DRAINS: (Bilateral # 10) Jackson-Pratt drain(s) with closed bulb suction in the subdural space   LOCAL MEDICATIONS USED:  MARCAINE    and LIDOCAINE   SPECIMEN:  No Specimen  DISPOSITION OF SPECIMEN:  N/A  COUNTS:  YES  TOURNIQUET:  * No tourniquets in log *  DICTATION: Patient is 72 year old man who has developed bilateral subdural hematomas. He has had progressively worsening weakness and CT shows bilateral subdural hematoma with mass effect. It was elected to take patient to surgery for bilateral craniotomies for SDH.   Procedure: Following smooth intubation, patient was placed in brow up position. Head was placed on donut head holder and bi-frontal scalp was shaved and prepped and draped in usual sterile fashion. Area of planned incision was infiltrated with lidocaine. A ilinear incision was made and carried through temporalis fascia and muscle to expose calvarium on each side of his head. Skull flaps was elevated exposing subdural hematoma. Dura was opened and subdural was evacuated. The subdural was larger and under greater pressure on the right. Both subdural cavities were irrigated with saline until significantly clearer.  Hemostasis was assured. The brain was considerably more relaxed after hematoma evacuation. Bilateral #10 JP drains were placed and anchored with Nylon sutures. Bone flaps was replaced with plates, the fascia and galea were closed with 2-0 vicryl sutures and the skin was re approximated with staples. Sterile occlusive  dressings were placed. Patient was returned to a supine position and transferred to the ICU in stable and satisfactory condition. Counts were correct at the end of the case.   PLAN OF CARE: Admit to inpatient   PATIENT DISPOSITION:  PACU - hemodynamically stable.   Delay start of Pharmacological VTE agent (>24hrs) due to surgical blood loss or risk of bleeding: yes

## 2018-09-06 NOTE — Anesthesia Procedure Notes (Signed)
Procedure Name: Intubation Date/Time: 09/06/2018 2:17 AM Performed by: Claris Che, CRNA Pre-anesthesia Checklist: Patient identified, Emergency Drugs available, Suction available, Patient being monitored and Timeout performed Patient Re-evaluated:Patient Re-evaluated prior to induction Oxygen Delivery Method: Circle system utilized Preoxygenation: Pre-oxygenation with 100% oxygen Induction Type: IV induction, Rapid sequence and Cricoid Pressure applied Laryngoscope Size: Mac and 3 Grade View: Grade II Tube type: Subglottic suction tube Tube size: 8.0 mm Number of attempts: 1 Airway Equipment and Method: Stylet Placement Confirmation: ETT inserted through vocal cords under direct vision,  positive ETCO2 and breath sounds checked- equal and bilateral Secured at: 24 cm Tube secured with: Tape Dental Injury: Teeth and Oropharynx as per pre-operative assessment

## 2018-09-06 NOTE — Progress Notes (Addendum)
Subjective: Patient reports "I had a little headache earlier, but the Tylenol helped"  Objective: Vital signs in last 24 hours: Temp:  [97.3 F (36.3 C)-98.2 F (36.8 C)] 97.9 F (36.6 C) (11/04 0436) Pulse Rate:  [73-92] 76 (11/04 0700) Resp:  [12-23] 14 (11/04 0700) BP: (122-155)/(76-99) 140/89 (11/04 0700) SpO2:  [94 %-100 %] 100 % (11/04 0700) Weight:  [73.5 kg] 73.5 kg (11/04 0436)  Intake/Output from previous day: 11/03 0701 - 11/04 0700 In: 1800 [I.V.:1200; IV Piggyback:600] Out: 1031 [Urine:775; Drains:156; Blood:100] Intake/Output this shift: No intake/output data recorded.  Alert, conversant. Son present. MAEW with good strength. PEARL. No drift. JP's patent. Drsgs intact.  Lab Results: Recent Labs    09/05/18 2227 09/06/18 0459  WBC 6.3 5.1  HGB 11.7* 10.9*  HCT 36.2* 33.3*  PLT 84* 115*   BMET Recent Labs    09/05/18 2227 09/06/18 0459  NA 140 141  K 3.7 3.4*  CL 107 113*  CO2 25 24  GLUCOSE 94 114*  BUN 10 8  CREATININE 1.33* 1.29*  CALCIUM 8.8* 8.3*    Studies/Results: Ct Head Wo Contrast  Result Date: 09/05/2018 CLINICAL DATA:  Fall. Dizziness. EXAM: CT HEAD WITHOUT CONTRAST TECHNIQUE: Contiguous axial images were obtained from the base of the skull through the vertex without intravenous contrast. COMPARISON:  05/24/2018 FINDINGS: Brain: Subdural hematomas are present over both cerebral convexities measuring up to 2.6 cm in thickness on the right and 1.5 cm on the left. Layering hyperdense blood products are noted in both collections posteriorly. There is associated sulcal and partial ventricular effacement with 10 mm of leftward midline shift. No acute infarct is identified. Scattered cerebral white matter hypodensities are nonspecific but compatible with minimal chronic small vessel ischemic disease for age. Vascular: Mild calcified atherosclerosis at the skull base. No hyperdense vessel. Skull: No fracture or focal osseous lesion. Sinuses/Orbits:  Visualized paranasal sinuses and mastoid air cells are clear. Bilateral cataract extraction is noted. Other: None. IMPRESSION: Bilateral subdural hematomas with 10 mm of leftward midline shift. Critical Value/emergent results were called by telephone at the time of interpretation on 09/05/2018 at 11:11 pm to Dr. Loren Racer , who verbally acknowledged these results. Electronically Signed   By: Sebastian Ache M.D.   On: 09/05/2018 23:13    Assessment/Plan:   LOS: 0 days  supportive care continues   Georgiann Cocker 09/06/2018, 7:21 AM   Patient is doing well after surgery.  Advance diet.

## 2018-09-06 NOTE — Interval H&P Note (Signed)
History and Physical Interval Note:  09/06/2018 2:18 AM  Brian Stark  has presented today for surgery, with the diagnosis of Sub Dural Hematoma  The various methods of treatment have been discussed with the patient and family. After consideration of risks, benefits and other options for treatment, the patient has consented to  Procedure(s): CRANIOTOMY HEMATOMA EVACUATION SUBDURAL (N/A) as a surgical intervention .  The patient's history has been reviewed, patient examined, no change in status, stable for surgery.  I have reviewed the patient's chart and labs.  Questions were answered to the patient's satisfaction.     Dorian Heckle

## 2018-09-06 NOTE — H&P (Signed)
Reason for Consult:bilateral subdural hematomas Referring Physician: [name] is an 72 y.o. male.  [name]:LPFXTKW states he has had increased urinary frequency for the last few days.  This evening had roughly 3 episodes of his legs giving out.  Denied dizziness or lightheadedness.  Denied loss of consciousness.  Thinks he may have hit his head during 1 of the falls.  Denies neck pain.  No focal weakness or numbness.  Denies chest pain or shortness of breath.  No abdominal pain, nausea or vomiting.  No recent fever or chills.  Patient has h/o cluster headaches, for which he takes ibuprofen and BC powders.            Past Medical History:  Diagnosis Date  . Bursitis    l shoulder  . Migraines   . Rheumatic fever   . Tendonitis    L shoulder    Past Surgical History:  Procedure Laterality Date  . ABDOMINAL SURGERY    . CATARACT EXTRACTION, BILATERAL    . CHOLECYSTECTOMY    . EYE SURGERY      History reviewed. No pertinent family history.  Social History:  reports that he has been smoking. He has never used smokeless tobacco. He reports that he drinks alcohol. He reports that he does not use drugs.  Allergies:  Allergies  Allergen Reactions  . Flucloxacillin Anaphylaxis    Medications: I have reviewed the patient's current medications.  Results for orders placed or performed during the hospital encounter of 09/05/18 (from the past 48 hour(s))  CBC with Differential     Status: Abnormal   Collection Time: 09/05/18 10:27 PM  Result Value Ref Range   WBC 6.3 4.0 - 10.5 K/uL   RBC 3.38 (L) 4.22 - 5.81 MIL/uL   Hemoglobin 11.7 (L) 13.0 - 17.0 g/dL   HCT 36.2 (L) 39.0 - 52.0 %   MCV 107.1 (H) 80.0 - 100.0 fL   MCH 34.6 (H) 26.0 - 34.0 pg   MCHC 32.3 30.0 - 36.0 g/dL   RDW 13.9 11.5 - 15.5 %   Platelets 84 (L) 150 - 400 K/uL    Comment: Immature Platelet Fraction may be clinically indicated, consider ordering this additional test [id number]    nRBC 0.0 0.0  - 0.2 %   Neutrophils Relative % 81 %   Neutro Abs 5.1 1.7 - 7.7 K/uL   Lymphocytes Relative 8 %   Lymphs Abs 0.5 (L) 0.7 - 4.0 K/uL   Monocytes Relative 10 %   Monocytes Absolute 0.7 0.1 - 1.0 K/uL   Eosinophils Relative 0 %   Eosinophils Absolute 0.0 0.0 - 0.5 K/uL   Basophils Relative 1 %   Basophils Absolute 0.0 0.0 - 0.1 K/uL   Immature Granulocytes 0 %   Abs Immature Granulocytes 0.02 0.00 - 0.07 K/uL    Comment: Performed at [facility], [address], [place], [place] [address]  Comprehensive metabolic panel     Status: Abnormal   Collection Time: 09/05/18 10:27 PM  Result Value Ref Range   Sodium 140 135 - 145 mmol/L   Potassium 3.7 3.5 - 5.1 mmol/L   Chloride 107 98 - 111 mmol/L   CO2 25 22 - 32 mmol/L   Glucose, Bld 94 70 - 99 mg/dL   BUN 10 8 - 23 mg/dL   Creatinine, Ser 1.33 (H) 0.61 - 1.24 mg/dL   Calcium 8.8 (L) 8.9 - 10.3 mg/dL   Total Protein 6.7 6.5 - 8.1 g/dL  Albumin 3.8 3.5 - 5.0 g/dL   AST 28 15 - 41 U/L   ALT 14 0 - 44 U/L   Alkaline Phosphatase 82 38 - 126 U/L   Total Bilirubin 1.1 0.3 - 1.2 mg/dL   GFR calc non Af Amer 52 (L) >60 mL/min   GFR calc Af Amer >60 >60 mL/min    Comment: (NOTE) The eGFR has been calculated using the CKD EPI equation. This calculation has not been validated in all clinical situations. eGFR's persistently <60 mL/min signify possible Chronic Kidney Disease.    Anion gap 8 5 - 15    Comment: Performed at [facility], [address] [place], [place] [address]  Ethanol     Status: None   Collection Time: 09/05/18 10:27 PM  Result Value Ref Range   Alcohol, Ethyl (B) <10 <10 mg/dL    Comment: (NOTE) Lowest detectable limit for serum alcohol is 10 mg/dL. For medical purposes only. Performed at [facility], [address] [place], [place] [address]   Protime-INR     Status: None   Collection Time: 09/05/18 10:27 PM  Result Value Ref Range   Prothrombin Time 13.5 11.4 - 15.2 seconds   INR 1.04     Comment: Performed  at [facility], [address], [place], [place] [address]  APTT     Status: None   Collection Time: 09/05/18 10:27 PM  Result Value Ref Range   aPTT 26 24 - 36 seconds    Comment: Performed at [facility], [address], [place], [place] [address]  Urinalysis, Routine w reflex microscopic     Status: Abnormal   Collection Time: 09/05/18 11:12 PM  Result Value Ref Range   Color, Urine YELLOW YELLOW   APPearance CLEAR CLEAR   Specific Gravity, Urine <1.005 (L) 1.005 - 1.030   pH 6.0 5.0 - 8.0   Glucose, UA NEGATIVE NEGATIVE mg/dL   Hgb urine dipstick TRACE (A) NEGATIVE   Bilirubin Urine NEGATIVE NEGATIVE   Ketones, ur NEGATIVE NEGATIVE mg/dL   Protein, ur NEGATIVE NEGATIVE mg/dL   Nitrite NEGATIVE NEGATIVE   Leukocytes, UA NEGATIVE NEGATIVE    Comment: Performed at [facility], [address], [place], [place] [address]  Rapid urine drug screen (hospital performed)     Status: Abnormal   Collection Time: 09/05/18 11:12 PM  Result Value Ref Range   Opiates POSITIVE (A) NONE DETECTED   Cocaine NONE DETECTED NONE DETECTED   Benzodiazepines NONE DETECTED NONE DETECTED   Amphetamines NONE DETECTED NONE DETECTED   Tetrahydrocannabinol NONE DETECTED NONE DETECTED   Barbiturates NONE DETECTED NONE DETECTED    Comment: (NOTE) DRUG SCREEN FOR MEDICAL PURPOSES ONLY.  IF CONFIRMATION IS NEEDED FOR ANY PURPOSE, NOTIFY LAB WITHIN 5 DAYS. LOWEST DETECTABLE LIMITS FOR URINE DRUG SCREEN Drug Class                     Cutoff (ng/mL) Amphetamine and metabolites    1000 Barbiturate and metabolites    200 Benzodiazepine                 850 Tricyclics and metabolites     300 Opiates and metabolites        300 Cocaine and metabolites        300 THC                            50 Performed at [facility],  [address] [place], [place] [address]   Urinalysis, Microscopic (reflex)     Status: None   Collection Time: 09/05/18 11:12 PM  Result Value Ref Range   RBC / HPF NONE SEEN 0 - 5  RBC/hpf   WBC, UA NONE SEEN 0 - 5 WBC/hpf   Bacteria, UA NONE SEEN NONE SEEN   Squamous Epithelial / LPF NONE SEEN 0 - 5    Comment: Performed at [facility], [address] [place], [place] [address]  Type and screen Ordered by PROVIDER DEFAULT     Status: None (Preliminary result)   Collection Time: 09/06/18  1:25 AM  Result Value Ref Range   ABO/RH(D) O POS    Antibody Screen NEG    Sample Expiration      09/09/2018 Performed at [facility], [place] [address], [place], [place] [address]    Unit Number [id number]    Blood Component Type RBC LR PHER1    Unit division 00    Status of Unit ALLOCATED    Transfusion Status OK TO TRANSFUSE    Crossmatch Result Compatible    Unit Number [id number]    Blood Component Type RBC LR PHER1    Unit division 00    Status of Unit ALLOCATED    Transfusion Status OK TO TRANSFUSE    Crossmatch Result Compatible   ABO/Rh     Status: None (Preliminary result)   Collection Time: 09/06/18  1:25 AM  Result Value Ref Range   ABO/RH(D)      O POS Performed at [facility], [place] [address], [place], [place] [address]   Prepare platelet pheresis     Status: None (Preliminary result)   Collection Time: 09/06/18  1:27 AM  Result Value Ref Range   Unit Number [id number]    Blood Component Type PLTPHER LR2    Unit division 00    Status of Unit ISSUED    Transfusion Status      OK TO TRANSFUSE Performed at [place] [address] [place], [place] [address]     Ct Head Wo Contrast  Result Date: 09/05/2018 CLINICAL DATA:  Fall. Dizziness. EXAM: CT HEAD WITHOUT CONTRAST TECHNIQUE: Contiguous axial images were obtained from the base of the skull through the vertex without intravenous contrast. COMPARISON:  05/24/2018 FINDINGS: Brain: Subdural hematomas are present over both cerebral convexities measuring up to 2.6 cm in thickness on the right and 1.5 cm on the left. Layering hyperdense blood products are noted in both  collections posteriorly. There is associated sulcal and partial ventricular effacement with 10 mm of leftward midline shift. No acute infarct is identified. Scattered cerebral white matter hypodensities are nonspecific but compatible with minimal chronic small vessel ischemic disease for age. Vascular: Mild calcified atherosclerosis at the skull base. No hyperdense vessel. Skull: No fracture or focal osseous lesion. Sinuses/Orbits: Visualized paranasal sinuses and mastoid air cells are clear. Bilateral cataract extraction is noted. Other: None. IMPRESSION: Bilateral subdural hematomas with 10 mm of leftward midline shift. Critical Value/emergent results were called by telephone at the time of interpretation on 09/05/2018 at 11:11 pm to Dr. Julianne Rice , who verbally acknowledged these results. Electronically Signed   By: Logan Bores M.D.   On: 09/05/2018 23:13    Review of Systems - Negative except occasional drinker of EtOH,  Unaware of issues with his blood or bleeding    Blood pressure (!) 146/88, pulse 89, temperature 98.2 F (36.8 C), temperature  source Oral, resp. rate (!) 23, SpO2 95 %. Physical Exam  Constitutional: He is oriented to person, place, and time. He appears well-developed and well-nourished.  HENT:  Head: Normocephalic and atraumatic.  Eyes: EOM are normal.  Right ptosis Anisocoria, left pupil > right   Neurological: He is alert and oriented to person, place, and time. He has normal reflexes. A cranial nerve deficit is present. No sensory deficit. GCS eye subscore is 4. GCS verbal subscore is 5. GCS motor subscore is 6.  Left hemiparesis with left drift, hand intrinsic weakness, left leg weakness (> AG all groups)    Assessment/Plan: Patient has right Horner's, left > right sided weakness, bilateral subdural hematomas with brain compression and left shift.  Plan is emergent bilateral craniotomies for SDH.  Patient and his son are aware of risks of surgery and wish to  proceed.    Peggyann Shoals, MD 09/06/2018, 2:11 AM

## 2018-09-06 NOTE — Brief Op Note (Signed)
09/06/2018  3:38 AM  PATIENT:  Brian Stark  72 y.o. male  PRE-OPERATIVE DIAGNOSIS:  Bilateral Subdural Hematoma  POST-OPERATIVE DIAGNOSIS:  Bilateral Subdural Hematoma  PROCEDURE:  Procedure(s): BILATERAL CRANIOTOMIES FOR SUBDURAL HEMATOMA EVACUATION (Bilateral)  SURGEON:  Surgeon(s) and Role:    * Mekenzie Modeste, MD - Primary  PHYSICIAN ASSISTANT:   ASSISTANTS: none   ANESTHESIA:   general  EBL:  100 mL   BLOOD ADMINISTERED:none  DRAINS: (Bilateral # 10) Jackson-Pratt drain(s) with closed bulb suction in the subdural space   LOCAL MEDICATIONS USED:  MARCAINE    and LIDOCAINE   SPECIMEN:  No Specimen  DISPOSITION OF SPECIMEN:  N/A  COUNTS:  YES  TOURNIQUET:  * No tourniquets in log *  DICTATION: Patient is 72 year old man who has developed bilateral subdural hematomas. He has had progressively worsening weakness and CT shows bilateral subdural hematoma with mass effect. It was elected to take patient to surgery for bilateral craniotomies for SDH.   Procedure: Following smooth intubation, patient was placed in brow up position. Head was placed on donut head holder and bi-frontal scalp was shaved and prepped and draped in usual sterile fashion. Area of planned incision was infiltrated with lidocaine. A ilinear incision was made and carried through temporalis fascia and muscle to expose calvarium on each side of his head. Skull flaps was elevated exposing subdural hematoma. Dura was opened and subdural was evacuated. The subdural was larger and under greater pressure on the right. Both subdural cavities were irrigated with saline until significantly clearer.  Hemostasis was assured. The brain was considerably more relaxed after hematoma evacuation. Bilateral #10 JP drains were placed and anchored with Nylon sutures. Bone flaps was replaced with plates, the fascia and galea were closed with 2-0 vicryl sutures and the skin was re approximated with staples. Sterile occlusive  dressings were placed. Patient was returned to a supine position and transferred to the ICU in stable and satisfactory condition. Counts were correct at the end of the case.   PLAN OF CARE: Admit to inpatient   PATIENT DISPOSITION:  PACU - hemodynamically stable.   Delay start of Pharmacological VTE agent (>24hrs) due to surgical blood loss or risk of bleeding: yes  

## 2018-09-06 NOTE — Anesthesia Postprocedure Evaluation (Signed)
Anesthesia Post Note  Patient: Brian Stark  Procedure(s) Performed: BILATERAL CRANIOTOMIES FOR SUBDURAL HEMATOMA EVACUATION (Bilateral Head)     Patient location during evaluation: PACU Anesthesia Type: General Level of consciousness: sedated Pain management: pain level controlled Vital Signs Assessment: post-procedure vital signs reviewed and stable Respiratory status: spontaneous breathing and respiratory function stable Cardiovascular status: stable Postop Assessment: no apparent nausea or vomiting Anesthetic complications: no    Last Vitals:  Vitals:   09/06/18 0415 09/06/18 0423  BP:  (!) 148/92  Pulse: 83 82  Resp: 16 16  Temp:  (!) (P) 36.4 C  SpO2: 98% (P) 97%    Last Pain:  Vitals:   09/06/18 0349  TempSrc:   PainSc: 0-No pain                 Darrell Leonhardt DANIEL

## 2018-09-06 NOTE — Progress Notes (Signed)
Pharmacy Antibiotic Note  Brian Stark is a 72 y.o. male admitted on 09/05/2018 with subdural hematoma s/p fall, now post-op and requiring surgical prophylaxis.  Pharmacy has been consulted for vancomycin dosing x24h coverage.  Plan: Rec'd vanc 1g in OR; will give vanc 1g 12h after first dose and f/u to confirm that 24h coverage is sufficient.  Temp (24hrs), Avg:97.8 F (36.6 C), Min:97.3 F (36.3 C), Max:98.2 F (36.8 C)  Recent Labs  Lab 09/05/18 2227  WBC 6.3  CREATININE 1.33*     Allergies  Allergen Reactions  . Flucloxacillin Anaphylaxis     Thank you for allowing pharmacy to be a part of this patient's care.  Vernard Gambles, PharmD, BCPS  09/06/2018 4:31 AM

## 2018-09-07 ENCOUNTER — Encounter (HOSPITAL_COMMUNITY): Payer: Self-pay | Admitting: Neurosurgery

## 2018-09-07 ENCOUNTER — Other Ambulatory Visit: Payer: Self-pay

## 2018-09-07 LAB — PREPARE PLATELET PHERESIS: UNIT DIVISION: 0

## 2018-09-07 LAB — BPAM PLATELET PHERESIS
BLOOD PRODUCT EXPIRATION DATE: 201911042359
ISSUE DATE / TIME: 201911040147
Unit Type and Rh: 600

## 2018-09-07 MED ORDER — PANTOPRAZOLE SODIUM 40 MG PO TBEC
40.0000 mg | DELAYED_RELEASE_TABLET | Freq: Every day | ORAL | Status: DC
  Administered 2018-09-07 – 2018-09-15 (×9): 40 mg via ORAL
  Filled 2018-09-07 (×9): qty 1

## 2018-09-07 NOTE — Progress Notes (Signed)
Subjective: Patient reports "pretty rugged"  Objective: Vital signs in last 24 hours: Temp:  [97.5 F (36.4 C)-98.1 F (36.7 C)] 97.5 F (36.4 C) (11/05 0400) Pulse Rate:  [60-87] 63 (11/05 0700) Resp:  [9-22] 13 (11/05 0700) BP: (97-157)/(57-127) 117/70 (11/05 0700) SpO2:  [89 %-99 %] 90 % (11/05 0700)  Intake/Output from previous day: 11/04 0701 - 11/05 0700 In: 2053.3 [P.O.:200; I.V.:1453.3; IV Piggyback:400.1] Out: 1630 [Urine:1450; Drains:180] Intake/Output this shift: No intake/output data recorded.  Physical Exam: Full strength.  No drift.  Serosanguinous drain output. Dressings CDI.  Lab Results: Recent Labs    09/05/18 2227 09/06/18 0459  WBC 6.3 5.1  HGB 11.7* 10.9*  HCT 36.2* 33.3*  PLT 84* 115*   BMET Recent Labs    09/05/18 2227 09/06/18 0459  NA 140 141  K 3.7 3.4*  CL 107 113*  CO2 25 24  GLUCOSE 94 114*  BUN 10 8  CREATININE 1.33* 1.29*  CALCIUM 8.8* 8.3*    Studies/Results: Ct Head Wo Contrast  Result Date: 09/05/2018 CLINICAL DATA:  Fall. Dizziness. EXAM: CT HEAD WITHOUT CONTRAST TECHNIQUE: Contiguous axial images were obtained from the base of the skull through the vertex without intravenous contrast. COMPARISON:  05/24/2018 FINDINGS: Brain: Subdural hematomas are present over both cerebral convexities measuring up to 2.6 cm in thickness on the right and 1.5 cm on the left. Layering hyperdense blood products are noted in both collections posteriorly. There is associated sulcal and partial ventricular effacement with 10 mm of leftward midline shift. No acute infarct is identified. Scattered cerebral white matter hypodensities are nonspecific but compatible with minimal chronic small vessel ischemic disease for age. Vascular: Mild calcified atherosclerosis at the skull base. No hyperdense vessel. Skull: No fracture or focal osseous lesion. Sinuses/Orbits: Visualized paranasal sinuses and mastoid air cells are clear. Bilateral cataract extraction is  noted. Other: None. IMPRESSION: Bilateral subdural hematomas with 10 mm of leftward midline shift. Critical Value/emergent results were called by telephone at the time of interpretation on 09/05/2018 at 11:11 pm to Dr. Loren Racer , who verbally acknowledged these results. Electronically Signed   By: Sebastian Ache M.D.   On: 09/05/2018 23:13    Assessment/Plan: Patient is doing well.  Mobilize today OOB.  Continue drains today.  Head CT in AM.    LOS: 1 day    Dorian Heckle, MD 09/07/2018, 7:35 AM

## 2018-09-08 ENCOUNTER — Inpatient Hospital Stay (HOSPITAL_COMMUNITY): Payer: Medicare Other

## 2018-09-08 MED FILL — Thrombin (Recombinant) For Soln 5000 Unit: CUTANEOUS | Qty: 5000 | Status: AC

## 2018-09-08 MED FILL — Thrombin (Recombinant) For Soln 20000 Unit: CUTANEOUS | Qty: 1 | Status: AC

## 2018-09-08 NOTE — Progress Notes (Signed)
Per day shift RN yesterday and per patient, Keppra dose made patient vomit. He refused another dose later yesterday. Keppra dose was given by me at 0430 this morning and shortly after starting infusion patient vomited again. Keppra was stopped at this time.

## 2018-09-08 NOTE — Evaluation (Signed)
Physical Therapy Evaluation Patient Details Name: Brian Stark MRN: [id number] DOB: [birth year] Today's Date: 09/08/2018   History of Present Illness  72 yo admitted with increased urinary frequency and legs giving out. 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 PMhx: bursitis, tendonitis, migraines  Clinical Impression  Pt pleasant and ready to increase mobility. Pt states he feels weak and fatigued and prior to last week had not had falls but did struggle with HA. Pt with decreased mobility, activity tolerance and gait who lives alone and is unsure of what assist family can provide and lives in a 2story townhome. Pt will benefit from acute therapy to maximize mobility, function and gait to return to PLOF.     Follow Up Recommendations SNF;Supervision for mobility/OOB    Equipment Recommendations  Rolling walker with 5" wheels    Recommendations for Other Services OT consult     Precautions / Restrictions Precautions Precautions: Fall Precaution Comments: jp drain Restrictions Weight Bearing Restrictions: No      Mobility  Bed Mobility Overal bed mobility: Modified Independent             General bed mobility comments: increased time  Transfers Overall transfer level: Needs assistance   Transfers: Sit to/from Stand Sit to Stand: Min guard         General transfer comment: need for UE support to rise, pushing on footboard  Ambulation/Gait Ambulation/Gait assistance: Min guard Gait Distance (Feet): 150 Feet Assistive device: Rolling walker (2 wheeled) Gait Pattern/deviations: Step-through pattern;Decreased stride length;Trunk flexed   Gait velocity interpretation: 1.31 - 2.62 ft/sec, indicative of limited community ambulator General Gait Details: pt cautious with cues for position in RW, direction and guarding for balance. Pt very fatigued after gait  Stairs            Wheelchair Mobility    Modified Rankin (Stroke Patients Only)        Balance Overall balance assessment: Needs assistance   Sitting balance-Leahy Scale: Good       Standing balance-Leahy Scale: Fair Standing balance comment: pt able to stand for urinal without physical assist but required RW for gait                             Pertinent Vitals/Pain Pain Assessment: 0-10 Pain Score: 8  Pain Location: head ache Pain Descriptors / Indicators: Aching;Constant Pain Intervention(s): Limited activity within patient's tolerance;Repositioned;Monitored during session;Patient requesting pain meds-RN notified    Home Living Family/patient expects to be discharged to:: Private residence Living Arrangements: Alone Available Help at Discharge: Family;Available PRN/intermittently Type of Home: House Home Access: Level entry     Home Layout: Two level;Bed/bath upstairs Home Equipment: Cane - single point Additional Comments: walker and toilet riser are available from mom's house    Prior Function Level of Independence: Independent               Hand Dominance        Extremity/Trunk Assessment        Lower Extremity Assessment Lower Extremity Assessment: Overall WFL for tasks assessed;LLE deficits/detail;RLE deficits/detail RLE Deficits / Details: 5/5 hip flexion, knee flexion and extension LLE Deficits / Details: 5/5 hip flexion, knee flexion and extension    Cervical / Trunk Assessment Cervical / Trunk Assessment: Other exceptions Cervical / Trunk Exceptions: bil incisions   Communication   Communication: No difficulties  Cognition Arousal/Alertness: Awake/alert Behavior During Therapy: Flat affect Overall Cognitive Status: Impaired/Different  from baseline Area of Impairment: Problem solving;Memory                     Memory: Decreased short-term memory       Problem Solving: Slow processing General Comments: decreased recall of room number with assist for wayfinding      General Comments       Exercises     Assessment/Plan    PT Assessment Patient needs continued PT services  PT Problem List Decreased strength;Decreased mobility;Decreased activity tolerance;Decreased balance;Decreased knowledge of use of DME;Decreased cognition       PT Treatment Interventions Gait training;Therapeutic exercise;Patient/family education;Stair training;Balance training;Functional mobility training;Therapeutic activities;DME instruction    PT Goals (Current goals can be found in the Care Plan section)  Acute Rehab PT Goals Patient Stated Goal: be able to get stronger and go home PT Goal Formulation: With patient Time For Goal Achievement: 09/22/18 Potential to Achieve Goals: Good    Frequency Min 3X/week   Barriers to discharge Decreased caregiver support      Co-evaluation               AM-PAC PT "6 Clicks" Daily Activity  Outcome Measure Difficulty turning over in bed (including adjusting bedclothes, sheets and blankets)?: A Little Difficulty moving from lying on back to sitting on the side of the bed? : A Little Difficulty sitting down on and standing up from a chair with arms (e.g., wheelchair, bedside commode, etc,.)?: A Little Help needed moving to and from a bed to chair (including a wheelchair)?: A Little Help needed walking in hospital room?: A Little Help needed climbing 3-5 steps with a railing? : A Little 6 Click Score: 18    End of Session Equipment Utilized During Treatment: Gait belt Activity Tolerance: Patient limited by fatigue Patient left: in chair;with call bell/phone within reach;with chair alarm set Nurse Communication: Mobility status PT Visit Diagnosis: Other abnormalities of gait and mobility (R26.89);Difficulty in walking, not elsewhere classified (R26.2)    Time: 8657-8469 PT Time Calculation (min) (ACUTE ONLY): 29 min   Charges:   PT Evaluation $PT Eval Moderate Complexity: 1 Mod PT Treatments $Gait Training: 8-22 mins        Inayah Woodin  Abner Greenspan, PT Acute Rehabilitation Services Pager: [phone] Office: [phone]   Lounette Sloan B Sya Nestler 09/08/2018, 11:44 AM

## 2018-09-08 NOTE — Evaluation (Addendum)
Occupational Therapy Evaluation Patient Details Name: Brian Stark MRN: [id number] DOB: [birth year] Today's Date: 09/08/2018    History of Present Illness 72 yo admitted with increased urinary frequency and legs giving out. 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 PMhx: bursitis, tendonitis, migraines   Clinical Impression   PTA patient independent and driving.  Currently admitted for above and limited by decreased activity tolerance, generalized weakness (L UE>R UE), impaired balance, and slow processing cognitively.  Patient currently requires min assist for UB ADLs, LB ADLs and toileting, min guard for transfers and grooming standing at sink.  Limited during session by nausea, which pt reports with mobility, and pt vomiting upon after mobility.  RN aware. Patient will benefit from continued OT services while admitted and after dc at SNF level in order to optimize independence and return to PLOF. Will continue to follow.     Follow Up Recommendations  SNF;Supervision/Assistance - 24 hour    Equipment Recommendations  Other (comment)(TBD at next venue of care)    Recommendations for Other Services       Precautions / Restrictions Precautions Precautions: Fall Precaution Comments: jp drain Restrictions Weight Bearing Restrictions: No      Mobility Bed Mobility Overal bed mobility: Modified Independent             General bed mobility comments: seated OOB in recliner upon entry  Transfers Overall transfer level: Needs assistance Equipment used: Rolling walker (2 wheeled) Transfers: Sit to/from Stand Sit to Stand: Min guard         General transfer comment: min guard for safety and balance, cueing required for hand placement    Balance Overall balance assessment: Needs assistance   Sitting balance-Leahy Scale: Good     Standing balance support: No upper extremity supported;During functional activity Standing balance-Leahy Scale:  Fair Standing balance comment: pt able to complete grooming tasks without UE support given min guard, but preference for UB support                            ADL either performed or assessed with clinical judgement   ADL Overall ADL's : Needs assistance/impaired     Grooming: Wash/dry hands;Oral care;Min guard;Standing   Upper Body Bathing: Supervision/ safety;Sitting   Lower Body Bathing: Sit to/from stand;Minimal assistance   Upper Body Dressing : Minimal assistance;Sitting   Lower Body Dressing: Minimal assistance;Sit to/from stand   Toilet Transfer: Min guard;Ambulation;RW(simulated to recliner )   Toileting- Clothing Manipulation and Hygiene: Minimal assistance;Sit to/from stand Toileting - Clothing Manipulation Details (indicate cue type and reason): assist with mgmt of clothing and urinal      Functional mobility during ADLs: Min guard;Rolling walker General ADL Comments: min guard for safety during mobility, increased time and effort for all tasks; limited by nausea      Vision Baseline Vision/History: Wears glasses Wears Glasses: Reading only Patient Visual Report: No change from baseline Vision Assessment?: No apparent visual deficits     Perception     Praxis      Pertinent Vitals/Pain Pain Assessment: Faces Pain Score: 8  Faces Pain Scale: Hurts even more Pain Location: head ache Pain Descriptors / Indicators: Aching;Constant Pain Intervention(s): Limited activity within patient's tolerance;Monitored during session;Repositioned     Hand Dominance Right   Extremity/Trunk Assessment Upper Extremity Assessment Upper Extremity Assessment: LUE deficits/detail;RUE deficits/detail RUE Deficits / Details: 4/5 grossly MMT  LUE Deficits / Details: 3+/5 grossly MMT, functional;  noted chronic L shoulder pain with FF to 90 degrees only LUE Sensation: WNL LUE Coordination: WNL   Lower Extremity Assessment Lower Extremity Assessment: Defer to PT  evaluation RLE Deficits / Details: 5/5 hip flexion, knee flexion and extension LLE Deficits / Details: 5/5 hip flexion, knee flexion and extension   Cervical / Trunk Assessment Cervical / Trunk Assessment: Other exceptions Cervical / Trunk Exceptions: bil incisions    Communication Communication Communication: No difficulties   Cognition Arousal/Alertness: Awake/alert Behavior During Therapy: Flat affect Overall Cognitive Status: Impaired/Different from baseline Area of Impairment: Problem solving;Memory                     Memory: Decreased short-term memory       Problem Solving: Slow processing General Comments: decreased recall of room number with assist for wayfinding   General Comments       Exercises     Shoulder Instructions      Home Living Family/patient expects to be discharged to:: Private residence Living Arrangements: Alone Available Help at Discharge: Family;Available PRN/intermittently Type of Home: House Home Access: Level entry     Home Layout: Two level;Bed/bath upstairs     Bathroom Shower/Tub: Chief Strategy Officer: Standard     Home Equipment: Cane - single point   Additional Comments: walker and toilet riser are available from mom's house      Prior Functioning/Environment Level of Independence: Independent        Comments: independent and driving         OT Problem List: Decreased strength;Decreased activity tolerance;Impaired balance (sitting and/or standing);Decreased cognition;Decreased safety awareness;Decreased knowledge of use of DME or AE;Pain      OT Treatment/Interventions: Self-care/ADL training;Therapeutic exercise;Neuromuscular education;Energy conservation;DME and/or AE instruction;Therapeutic activities;Patient/family education;Balance training;Cognitive remediation/compensation    OT Goals(Current goals can be found in the care plan section) Acute Rehab OT Goals Patient Stated Goal: be able  to get stronger and go home OT Goal Formulation: With patient Time For Goal Achievement: 09/22/18 Potential to Achieve Goals: Good  OT Frequency: Min 2X/week   Barriers to D/C:            Co-evaluation              AM-PAC PT "6 Clicks" Daily Activity     Outcome Measure Help from another person eating meals?: None Help from another person taking care of personal grooming?: A Little Help from another person toileting, which includes using toliet, bedpan, or urinal?: A Little Help from another person bathing (including washing, rinsing, drying)?: A Little Help from another person to put on and taking off regular upper body clothing?: A Little Help from another person to put on and taking off regular lower body clothing?: A Little 6 Click Score: 19   End of Session Equipment Utilized During Treatment: Gait belt;Rolling walker Nurse Communication: Mobility status(nausea and vomiting )  Activity Tolerance: Patient tolerated treatment well Patient left: in chair;with call bell/phone within reach;with chair alarm set  OT Visit Diagnosis: Other abnormalities of gait and mobility (R26.89);Muscle weakness (generalized) (M62.81);Pain Pain - part of body: (headache)                Time: 1610-9604 OT Time Calculation (min): 33 min Charges:  OT General Charges $OT Visit: 1 Visit OT Evaluation $OT Eval Moderate Complexity: 1 Mod OT Treatments $Self Care/Home Management : 8-22 mins  Chancy Milroy, OT Acute Rehabilitation Services Pager [phone] Office [phone]   Nadean Corwin  Murice Barbar 09/08/2018, 2:54 PM

## 2018-09-08 NOTE — Progress Notes (Signed)
Subjective: Patient reports doing well  Objective: Vital signs in last 24 hours: Temp:  [98 F (36.7 C)-98.3 F (36.8 C)] 98 F (36.7 C) (11/06 0400) Pulse Rate:  [61-76] 65 (11/06 0700) Resp:  [9-20] 12 (11/06 0700) BP: (103-139)/(59-90) 124/70 (11/06 0700) SpO2:  [86 %-97 %] 96 % (11/06 0700)  Intake/Output from previous day: 11/05 0701 - 11/06 0700 In: 2138.5 [P.O.:360; I.V.:1759.7; IV Piggyback:18.8] Out: 2110 [Urine:2050; Drains:60] Intake/Output this shift: No intake/output data recorded.  Physical Exam: MAEW.  No drift.    Lab Results: Recent Labs    09/05/18 2227 09/06/18 0459  WBC 6.3 5.1  HGB 11.7* 10.9*  HCT 36.2* 33.3*  PLT 84* 115*   BMET Recent Labs    09/05/18 2227 09/06/18 0459  NA 140 141  K 3.7 3.4*  CL 107 113*  CO2 25 24  GLUCOSE 94 114*  BUN 10 8  CREATININE 1.33* 1.29*  CALCIUM 8.8* 8.3*    Studies/Results: Ct Head Wo Contrast  Result Date: 09/08/2018 CLINICAL DATA:  Subdural hemorrhage follow-up. EXAM: CT HEAD WITHOUT CONTRAST TECHNIQUE: Contiguous axial images were obtained from the base of the skull through the vertex without intravenous contrast. COMPARISON:  Head CT 09/05/2018 FINDINGS: Brain: Bilateral subdural drainage catheters have been placed. Both subdural collections have decreased in size. The right-sided collection measures 20 mm, previously 28 mm. The left measures 12 mm, previously 14 mm. There is decreased mass effect on the lateral ventricles and brain parenchyma. No midline shift. No hydrocephalus. There are hyperdense blood products adjacent to both catheters, right greater than left. No parenchymal hemorrhage. Vascular: No abnormal hyperdensity of the major intracranial arteries or dural venous sinuses. No intracranial atherosclerosis. Skull: Bilateral burr holes with overlying skin staples. Sinuses/Orbits: No fluid levels or advanced mucosal thickening of the visualized paranasal sinuses. No mastoid or middle ear  effusion. The orbits are normal. IMPRESSION: 1. Decreased size of bilateral subdural hematomas following placement of drainage catheters. 2. Small amount of acute blood products along the course of the right subdural catheter. Minimal blood adjacent to the left catheter. Electronically Signed   By: Deatra Robinson M.D.   On: 09/08/2018 05:59    Assessment/Plan: CT improved.  D/C left drain today, right tomorrow if drainage continues low.  Mobilize today.    LOS: 2 days    Dorian Heckle, MD 09/08/2018, 7:58 AM

## 2018-09-09 LAB — TYPE AND SCREEN
ABO/RH(D): O POS
Antibody Screen: NEGATIVE
Unit division: 0
Unit division: 0

## 2018-09-09 LAB — BPAM RBC
BLOOD PRODUCT EXPIRATION DATE: 201912032359
Blood Product Expiration Date: 201912012359
Unit Type and Rh: 5100
Unit Type and Rh: 5100

## 2018-09-09 NOTE — Progress Notes (Signed)
Patient ID: Brian Stark, male   DOB: May 06, 1946, 72 y.o.   MRN: [id number] Right drain pulled on Dr. Fredrich Birks order. Staple and DSD to site. No bleeding. Pt tolerated well. Continue to mobilize with PT/OT today. Remain in ICU today per Dr. Venetia Maxon.

## 2018-09-09 NOTE — Progress Notes (Addendum)
Subjective: Patient reports "I just could't get to sleep. (Head) hurt a little earlier, but the medicine helped"  Objective: Vital signs in last 24 hours: Temp:  [97.6 F (36.4 C)-98.3 F (36.8 C)] 97.9 F (36.6 C) (11/07 0400) Pulse Rate:  [41-77] 70 (11/07 0700) Resp:  [9-22] 12 (11/07 0700) BP: (117-153)/(73-84) 122/82 (11/07 0700) SpO2:  [92 %-99 %] 92 % (11/07 0700)  Intake/Output from previous day: 11/06 0701 - 11/07 0700 In: 1889 [P.O.:480; I.V.:1409] Out: 2660 [Urine:2650; Drains:10] Intake/Output this shift: No intake/output data recorded.  Alert, conversant. MAEW. No drift. No erythema or drainage at incisions. Right JP 10ml overnight.   Lab Results: No results for input(s): WBC, HGB, HCT, PLT in the last 72 hours. BMET No results for input(s): NA, K, CL, CO2, GLUCOSE, BUN, CREATININE, CALCIUM in the last 72 hours.  Studies/Results: Ct Head Wo Contrast  Result Date: 09/08/2018 CLINICAL DATA:  Subdural hemorrhage follow-up. EXAM: CT HEAD WITHOUT CONTRAST TECHNIQUE: Contiguous axial images were obtained from the base of the skull through the vertex without intravenous contrast. COMPARISON:  Head CT 09/05/2018 FINDINGS: Brain: Bilateral subdural drainage catheters have been placed. Both subdural collections have decreased in size. The right-sided collection measures 20 mm, previously 28 mm. The left measures 12 mm, previously 14 mm. There is decreased mass effect on the lateral ventricles and brain parenchyma. No midline shift. No hydrocephalus. There are hyperdense blood products adjacent to both catheters, right greater than left. No parenchymal hemorrhage. Vascular: No abnormal hyperdensity of the major intracranial arteries or dural venous sinuses. No intracranial atherosclerosis. Skull: Bilateral burr holes with overlying skin staples. Sinuses/Orbits: No fluid levels or advanced mucosal thickening of the visualized paranasal sinuses. No mastoid or middle ear effusion. The  orbits are normal. IMPRESSION: 1. Decreased size of bilateral subdural hematomas following placement of drainage catheters. 2. Small amount of acute blood products along the course of the right subdural catheter. Minimal blood adjacent to the left catheter. Electronically Signed   By: Deatra Robinson M.D.   On: 09/08/2018 05:59    Assessment/Plan: improving  LOS: 3 days  Supportive care, mobilizing with therapies.    Georgiann Cocker 09/09/2018, 7:18 AM   We will remove remaining drain today, mobilize more actively with PT.

## 2018-09-09 NOTE — Plan of Care (Signed)
Patient educated and remains neuro intact and vitals stable. RN will continue to monitor patient neuro status and vitals.

## 2018-09-10 NOTE — Care Management Note (Signed)
Case Management Note  Patient Details  Name: Brian Stark MRN: [id number] Date of Birth: 1946/01/11  Subjective/Objective:  72 yo admitted with increased urinary frequency and legs giving out. 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  PTA, pt independent, lives alone.  His son lives in [place], [place].                 Action/Plan: Met with pt to discuss dc plans.  PT/OT recommending SNF for rehab at discharge, and pt agreeable to this.  He prefers [facility] in [place], if possible.  Will initiate FL2 for bed search and check with [facility] on bed availability.  Pt appreciative of help given.  RN Case Manager contact information left at bedside for pt and son.    Expected Discharge Date:                  Expected Discharge Plan:  Skilled Nursing Facility  In-House Referral:  Clinical Social Work  Discharge planning Services  CM Consult  Post Acute Care Choice:    Choice offered to:     DME Arranged:    DME Agency:     HH Arranged:    [place] Agency:     Status of Service:  In process, will continue to follow  If discussed at Long Length of Stay Meetings, dates discussed:    Additional Comments:  Reinaldo Raddle, RN, BSN  Trauma/Neuro ICU Case Manager [phone]

## 2018-09-10 NOTE — NC FL2 (Signed)
[place] MEDICAID FL2 LEVEL OF CARE SCREENING TOOL     IDENTIFICATION  Patient Name: Brian Stark Birthdate: 1946/03/29 Sex: male Admission Date (Current Location): 09/05/2018  [facility] and [place] Number:  [employer] and Address:  The [place], [address] [place], [place] [address]      Provider Number: [id number]  Attending Physician Name and Address:  Maeola Harman, MD  Relative Name and Phone Number:       Current Level of Care: Hospital Recommended Level of Care: Skilled Nursing Facility Prior Approval Number:    Date Approved/Denied:   PASRR Number: [id number]  Discharge Plan: SNF    Current Diagnoses: Patient Active Problem List   Diagnosis Date Noted  . Subdural hematoma (HCC) 09/06/2018  . Subdural bleeding (HCC) 09/06/2018    Orientation RESPIRATION BLADDER Height & Weight     Self, Time, Situation, Place  Normal Continent Weight: 162 lb 0.6 oz (73.5 kg) Height:  5\' 10"  (177.8 cm)  BEHAVIORAL SYMPTOMS/MOOD NEUROLOGICAL BOWEL NUTRITION STATUS      Continent Diet(Regular Diet with thin liquids)  AMBULATORY STATUS COMMUNICATION OF NEEDS Skin   Extensive Assist Verbally Surgical wounds(Left and Right Head Incision)                       Personal Care Assistance Level of Assistance  Bathing, Feeding, Dressing Bathing Assistance: Limited assistance Feeding assistance: Limited assistance Dressing Assistance: Limited assistance     Functional Limitations Info  Sight, Hearing, Speech Sight Info: Adequate Hearing Info: Adequate Speech Info: Adequate    SPECIAL CARE FACTORS FREQUENCY  PT (By licensed PT), OT (By licensed OT)     PT Frequency: 3-5 / week OT Frequency: 3-5 / week            Contractures Contractures Info: Not present    Additional Factors Info  Code Status, Allergies Code Status Info: Full Code Allergies Info: Flucloxacillin           Current Medications (09/10/2018):  This  is the current hospital active medication list Current Facility-Administered Medications  Medication Dose Route Frequency Provider Last Rate Last Dose  . 0.9 % NaCl with KCl 20 mEq/ L  infusion   Intravenous Continuous Maeola Harman, MD 75 mL/hr at 09/10/18 1400    . acetaminophen (TYLENOL) tablet 650 mg  650 mg Oral Q4H PRN Maeola Harman, MD   650 mg at 09/06/18 2841   Or  . acetaminophen (TYLENOL) suppository 650 mg  650 mg Rectal Q4H PRN Maeola Harman, MD      . bisacodyl (DULCOLAX) suppository 10 mg  10 mg Rectal Daily PRN Maeola Harman, MD      . Melene Muller ON 10/01/2018] cyanocobalamin ((VITAMIN B-12)) injection 1,000 mcg  1,000 mcg Intramuscular Q30 days Maeola Harman, MD      . docusate sodium (COLACE) capsule 100 mg  100 mg Oral BID Maeola Harman, MD   100 mg at 09/10/18 1147  . HYDROcodone-acetaminophen (NORCO/VICODIN) 5-325 MG per tablet 1 tablet  1 tablet Oral Q4H PRN Maeola Harman, MD   1 tablet at 09/10/18 1336  . labetalol (NORMODYNE,TRANDATE) injection 10-40 mg  10-40 mg Intravenous Q10 min PRN Maeola Harman, MD      . levETIRAcetam (KEPPRA) IVPB 500 mg/100 mL premix  500 mg Intravenous Q12H Maeola Harman, MD   Stopped at 09/08/18 [phone]  . MEDLINE mouth rinse  15 mL Mouth Rinse BID Maeola Harman, MD   15 mL  at 09/10/18 1233  . morphine 2 MG/ML injection 1-2 mg  1-2 mg Intravenous Q2H PRN Maeola Harman, MD   1 mg at 09/10/18 1148  . ondansetron (ZOFRAN) tablet 4 mg  4 mg Oral Q4H PRN Maeola Harman, MD       Or  . ondansetron [facility]) injection 4 mg  4 mg Intravenous Q4H PRN Maeola Harman, MD   4 mg at 09/10/18 0917  . pantoprazole (PROTONIX) EC tablet 40 mg  40 mg Oral QHS Maeola Harman, MD   40 mg at 09/09/18 2256  . polyethylene glycol (MIRALAX / GLYCOLAX) packet 17 g  17 g Oral Daily PRN Maeola Harman, MD      . polyvinyl alcohol (LIQUIFILM TEARS) 1.4 % ophthalmic solution 1 drop  1 drop Both Eyes BID PRN Maeola Harman, MD      . promethazine (PHENERGAN) tablet 12.5-25 mg  12.5-25  mg Oral Q4H PRN Maeola Harman, MD      . sodium phosphate (FLEET) 7-19 GM/118ML enema 1 enema  1 enema Rectal Once PRN Maeola Harman, MD         Discharge Medications: Please see discharge summary for a list of discharge medications.  Relevant Imaging Results:  Relevant Lab Results:   Additional Information SSN [id number]    [place], [place] [phone]

## 2018-09-10 NOTE — Progress Notes (Signed)
Neurosurgery Service Progress Note  Subjective: No acute events overnight.   Objective: Vitals:   09/10/18 0300 09/10/18 0400 09/10/18 0500 09/10/18 0600  BP: 130/81 (!) 150/132  (!) 149/78  Pulse: 72 79 79 78  Resp: 11 18 19 11   Temp:  98.2 F (36.8 C)    TempSrc:  Oral    SpO2: 91% 92% 95% 95%  Weight:      Height:       Temp (24hrs), Avg:98 F (36.7 C), Min:97.8 F (36.6 C), Max:98.2 F (36.8 C)  CBC Latest Ref Rng & Units 09/06/2018 09/05/2018  WBC 4.0 - 10.5 K/uL 5.1 6.3  Hemoglobin 13.0 - 17.0 g/dL 10.9(L) 11.7(L)  Hematocrit 39.0 - 52.0 % 33.3(L) 36.2(L)  Platelets 150 - 400 K/uL 115(L) 84(L)   BMP Latest Ref Rng & Units 09/06/2018 09/05/2018 05/24/2018  Glucose 70 - 99 mg/dL 161(W) 94 -  BUN 8 - 23 mg/dL 8 10 -  Creatinine 9.60 - 1.24 mg/dL 4.54(U) 9.81(X) 9.14(N)  Sodium 135 - 145 mmol/L 141 140 -  Potassium 3.5 - 5.1 mmol/L 3.4(L) 3.7 -  Chloride 98 - 111 mmol/L 113(H) 107 -  CO2 22 - 32 mmol/L 24 25 -  Calcium 8.9 - 10.3 mg/dL 8.3(L) 8.8(L) -    Intake/Output Summary (Last 24 hours) at 09/10/2018 0806 Last data filed at 09/10/2018 0600 Gross per 24 hour  Intake 2337.09 ml  Output 1475 ml  Net 862.09 ml    Current Facility-Administered Medications:  .  0.9 % NaCl with KCl 20 mEq/ L  infusion, , Intravenous, Continuous, Maeola Harman, MD, Last Rate: 75 mL/hr at 09/10/18 0600 .  acetaminophen (TYLENOL) tablet 650 mg, 650 mg, Oral, Q4H PRN, 650 mg at 09/06/18 0521 **OR** acetaminophen (TYLENOL) suppository 650 mg, 650 mg, Rectal, Q4H PRN, Maeola Harman, MD .  bisacodyl (DULCOLAX) suppository 10 mg, 10 mg, Rectal, Daily PRN, Maeola Harman, MD .  Melene Muller ON 10/01/2018] cyanocobalamin ((VITAMIN B-12)) injection 1,000 mcg, 1,000 mcg, Intramuscular, Q30 days, Maeola Harman, MD .  docusate sodium (COLACE) capsule 100 mg, 100 mg, Oral, BID, Maeola Harman, MD, 100 mg at 09/09/18 2256 .  HYDROcodone-acetaminophen (NORCO/VICODIN) 5-325 MG per tablet 1 tablet, 1 tablet,  Oral, Q4H PRN, Maeola Harman, MD, 1 tablet at 09/10/18 0456 .  labetalol (NORMODYNE,TRANDATE) injection 10-40 mg, 10-40 mg, Intravenous, Q10 min PRN, Maeola Harman, MD .  levETIRAcetam (KEPPRA) IVPB 500 mg/100 mL premix, 500 mg, Intravenous, Q12H, Maeola Harman, MD, Stopped at 09/08/18 [phone] .  MEDLINE mouth rinse, 15 mL, Mouth Rinse, BID, Maeola Harman, MD, 15 mL at 09/09/18 0044 .  morphine 2 MG/ML injection 1-2 mg, 1-2 mg, Intravenous, Q2H PRN, Maeola Harman, MD, 2 mg at 09/09/18 1716 .  ondansetron (ZOFRAN) tablet 4 mg, 4 mg, Oral, Q4H PRN **OR** ondansetron (ZOFRAN) injection 4 mg, 4 mg, Intravenous, Q4H PRN, Maeola Harman, MD, 4 mg at 09/09/18 0042 .  pantoprazole (PROTONIX) EC tablet 40 mg, 40 mg, Oral, QHS, Maeola Harman, MD, 40 mg at 09/09/18 2256 .  polyethylene glycol (MIRALAX / GLYCOLAX) packet 17 g, 17 g, Oral, Daily PRN, Maeola Harman, MD .  polyvinyl alcohol (LIQUIFILM TEARS) 1.4 % ophthalmic solution 1 drop, 1 drop, Both Eyes, BID PRN, Maeola Harman, MD .  promethazine (PHENERGAN) tablet 12.5-25 mg, 12.5-25 mg, Oral, Q4H PRN, Maeola Harman, MD .  sodium phosphate (FLEET) 7-19 GM/118ML enema 1 enema, 1 enema, Rectal, Once PRN, Maeola Harman, MD   Physical Exam: AOx3, PERRL, EOMI, FS, FCx4 without preference  Assessment & Plan: 72 y.o. man s/p drainage of SDH, recovering well. -drain d/c'd yesterday -PT/OT rec SNF, discharge home vs SNF -SCDs/TEDs  Jadene Pierini  09/10/18 8:06 AM

## 2018-09-10 NOTE — Progress Notes (Signed)
Physical Therapy Treatment Patient Details Name: Brian Stark MRN: [id number] DOB: 04-May-1946 Today's Date: 09/10/2018    History of Present Illness 72 yo admitted with increased urinary frequency and legs giving out. 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 PMhx: bursitis, tendonitis, migraines    PT Comments    Pt pleasant and able to tolerate adding HEP to session but remains fatigued with gait without nausea this session. Pt reports he and family discussed available help and remains agreeable to ST-SNF. Pt encouraged to mobilize with nursing and perform HEP. Progressing slowly.     Follow Up Recommendations  SNF;Supervision for mobility/OOB     Equipment Recommendations  Rolling walker with 5" wheels    Recommendations for Other Services       Precautions / Restrictions Precautions Precautions: Fall    Mobility  Bed Mobility Overal bed mobility: Modified Independent                Transfers Overall transfer level: Needs assistance   Transfers: Sit to/from Stand Sit to Stand: Min guard         General transfer comment: min guard for safety and balance, cueing required for hand placement  Ambulation/Gait Ambulation/Gait assistance: Min guard Gait Distance (Feet): 150 Feet Assistive device: Rolling walker (2 wheeled) Gait Pattern/deviations: Step-through pattern;Decreased stride length;Trunk flexed Gait velocity: 70ft/55sec= .49 Gait velocity interpretation: <1.31 ft/sec, indicative of household ambulator General Gait Details: pt cautious with cues for position in RW, guarding for balance. fatigued after gait. Unable to turn head with gait and reports neck motion pulls on incisions causing pain. Slow gait   Stairs             Wheelchair Mobility    Modified Rankin (Stroke Patients Only)       Balance Overall balance assessment: Needs assistance   Sitting balance-Leahy Scale: Good     Standing balance support: No  upper extremity supported;During functional activity Standing balance-Leahy Scale: Fair Standing balance comment: able to stand to use urinal but dependent on RW for gait                            Cognition Arousal/Alertness: Awake/alert Behavior During Therapy: Flat affect Overall Cognitive Status: Impaired/Different from baseline Area of Impairment: Problem solving                     Memory: Decreased short-term memory       Problem Solving: Slow processing General Comments: pt able to recall room number, pt with slow processing      Exercises General Exercises - Lower Extremity Long Arc Quad: AROM;15 reps;Seated;Both Hip Flexion/Marching: AROM;15 reps;Seated;Both    General Comments        Pertinent Vitals/Pain Pain Score: 5  Pain Location: head Pain Descriptors / Indicators: Aching;Constant    Home Living                      Prior Function            PT Goals (current goals can now be found in the care plan section) Progress towards PT goals: Progressing toward goals    Frequency           PT Plan Current plan remains appropriate    Co-evaluation              AM-PAC PT "6 Clicks" Daily Activity  Outcome Measure  Difficulty turning  over in bed (including adjusting bedclothes, sheets and blankets)?: A Little Difficulty moving from lying on back to sitting on the side of the bed? : A Little Difficulty sitting down on and standing up from a chair with arms (e.g., wheelchair, bedside commode, etc,.)?: A Little Help needed moving to and from a bed to chair (including a wheelchair)?: A Little Help needed walking in hospital room?: A Little Help needed climbing 3-5 steps with a railing? : A Little 6 Click Score: 18    End of Session Equipment Utilized During Treatment: Gait belt Activity Tolerance: Patient limited by fatigue Patient left: in chair;with call bell/phone within reach;with chair alarm set Nurse  Communication: Mobility status PT Visit Diagnosis: Other abnormalities of gait and mobility (R26.89);Difficulty in walking, not elsewhere classified (R26.2)     Time: 6045-4098 PT Time Calculation (min) (ACUTE ONLY): 27 min  Charges:  $Gait Training: 8-22 mins $Therapeutic Exercise: 8-22 mins                     Brian Stark, PT Acute Rehabilitation Services Pager: [phone] Office: [phone]    Brian Stark 09/10/2018, 1:01 PM

## 2018-09-11 NOTE — Progress Notes (Signed)
NEUROSURGERY PROGRESS NOTE  Doing well. No acute events overnight. Still some occasional headaches and sensitivity at times. Incisions CDI  Temp:  [97.8 F (36.6 C)-98.8 F (37.1 C)] 98.8 F (37.1 C) (11/09 0300) Pulse Rate:  [64-81] 75 (11/09 0300) Resp:  [10-17] 14 (11/09 0300) BP: (110-153)/(65-114) 148/84 (11/09 0300) SpO2:  [93 %-98 %] 96 % (11/09 0300)  Plan: Plan is for SNF placement probably Monday. Continue therapies. No new nsgy recom.   Sherryl Manges, NP 09/11/2018 8:29 AM

## 2018-09-12 MED ORDER — DEXAMETHASONE SODIUM PHOSPHATE 4 MG/ML IJ SOLN
4.0000 mg | Freq: Four times a day (QID) | INTRAMUSCULAR | Status: AC
  Administered 2018-09-12 – 2018-09-13 (×4): 4 mg via INTRAVENOUS
  Filled 2018-09-12 (×4): qty 1

## 2018-09-12 NOTE — Progress Notes (Signed)
Patient ID: Brian Stark, male   DOB: 02-20-1946, 72 y.o.   MRN: [id number] C/o some headache which is slowly subsiding, no new NTW, awake alert and conversant, incisions CDI, non focal neuro, awaiting snf

## 2018-09-13 MED ORDER — HYDROCODONE-ACETAMINOPHEN 5-325 MG PO TABS: 1.0000 | ORAL_TABLET | ORAL | 0 refills | Status: DC | PRN

## 2018-09-13 NOTE — Progress Notes (Signed)
Neurosurgery Service Progress Note  Subjective: No acute events overnight, headache improved  Objective: Vitals:   09/12/18 1723 09/12/18 1900 09/12/18 2300 09/13/18 0420  BP: (!) 146/88 (!) 144/86 136/73 129/81  Pulse: 78  83 72  Resp: 17  14 20   Temp:  98.9 F (37.2 C) 98.6 F (37 C) 97.7 F (36.5 C)  TempSrc:  Oral Oral Oral  SpO2: 92%  95% 96%  Weight:      Height:       Temp (24hrs), Avg:98.5 F (36.9 C), Min:97.7 F (36.5 C), Max:98.9 F (37.2 C)  CBC Latest Ref Rng & Units 09/06/2018 09/05/2018  WBC 4.0 - 10.5 K/uL 5.1 6.3  Hemoglobin 13.0 - 17.0 g/dL 10.9(L) 11.7(L)  Hematocrit 39.0 - 52.0 % 33.3(L) 36.2(L)  Platelets 150 - 400 K/uL 115(L) 84(L)   BMP Latest Ref Rng & Units 09/06/2018 09/05/2018 05/24/2018  Glucose 70 - 99 mg/dL 161(W) 94 -  BUN 8 - 23 mg/dL 8 10 -  Creatinine 9.60 - 1.24 mg/dL 4.54(U) 9.81(X) 9.14(N)  Sodium 135 - 145 mmol/L 141 140 -  Potassium 3.5 - 5.1 mmol/L 3.4(L) 3.7 -  Chloride 98 - 111 mmol/L 113(H) 107 -  CO2 22 - 32 mmol/L 24 25 -  Calcium 8.9 - 10.3 mg/dL 8.3(L) 8.8(L) -    Intake/Output Summary (Last 24 hours) at 09/13/2018 0734 Last data filed at 09/13/2018 0600 Gross per 24 hour  Intake 840 ml  Output 2100 ml  Net -1260 ml    Current Facility-Administered Medications:  .  0.9 % NaCl with KCl 20 mEq/ L  infusion, , Intravenous, Continuous, Tia Alert, MD, Last Rate: 0 mL/hr at 09/12/18 0941 .  acetaminophen (TYLENOL) tablet 650 mg, 650 mg, Oral, Q4H PRN, 650 mg at 09/11/18 0523 **OR** acetaminophen (TYLENOL) suppository 650 mg, 650 mg, Rectal, Q4H PRN, Maeola Harman, MD .  bisacodyl (DULCOLAX) suppository 10 mg, 10 mg, Rectal, Daily PRN, Maeola Harman, MD .  Melene Muller ON 10/01/2018] cyanocobalamin ((VITAMIN B-12)) injection 1,000 mcg, 1,000 mcg, Intramuscular, Q30 days, Maeola Harman, MD .  docusate sodium (COLACE) capsule 100 mg, 100 mg, Oral, BID, Maeola Harman, MD, 100 mg at 09/12/18 2130 .  HYDROcodone-acetaminophen  (NORCO/VICODIN) 5-325 MG per tablet 1 tablet, 1 tablet, Oral, Q4H PRN, Maeola Harman, MD, 1 tablet at 09/13/18 0615 .  labetalol (NORMODYNE,TRANDATE) injection 10-40 mg, 10-40 mg, Intravenous, Q10 min PRN, Maeola Harman, MD .  MEDLINE mouth rinse, 15 mL, Mouth Rinse, BID, Maeola Harman, MD, 15 mL at 09/12/18 2130 .  morphine 2 MG/ML injection 1-2 mg, 1-2 mg, Intravenous, Q2H PRN, Maeola Harman, MD, 1 mg at 09/12/18 0853 .  ondansetron (ZOFRAN) tablet 4 mg, 4 mg, Oral, Q4H PRN **OR** ondansetron (ZOFRAN) injection 4 mg, 4 mg, Intravenous, Q4H PRN, Maeola Harman, MD, 4 mg at 09/10/18 0917 .  pantoprazole (PROTONIX) EC tablet 40 mg, 40 mg, Oral, QHS, Maeola Harman, MD, 40 mg at 09/12/18 2130 .  polyethylene glycol (MIRALAX / GLYCOLAX) packet 17 g, 17 g, Oral, Daily PRN, Maeola Harman, MD .  polyvinyl alcohol (LIQUIFILM TEARS) 1.4 % ophthalmic solution 1 drop, 1 drop, Both Eyes, BID PRN, Maeola Harman, MD .  promethazine (PHENERGAN) tablet 12.5-25 mg, 12.5-25 mg, Oral, Q4H PRN, Maeola Harman, MD .  sodium phosphate (FLEET) 7-19 GM/118ML enema 1 enema, 1 enema, Rectal, Once PRN, Maeola Harman, MD   Physical Exam: AOx3, PERRL, EOMI, FS, FCx4 without preference  Assessment & Plan: 72 y.o. man s/p drainage of SDH, recovering  well. -discharge to SNF today -SCDs/TEDs  Jadene Pierini  09/13/18 7:34 AM

## 2018-09-13 NOTE — Discharge Summary (Addendum)
Discharge Summary  Date of Admission: 09/05/2018  Date of Discharge: 09/16/18  Attending Physician: Maeola Harman, MD  Hospital Course: Patient presented to the ED with anisocoria and left hemiparesis and was found to have bilateral subdural hematomas with brain compression. He was taken to the OR emergently for bilateral craniotomies for evacuation of subdural hematomas. A post-op CT showed good evacuation and his bilateral drains were removed. He returned to his neurologic baseline and was evaluated by PT/OT, who recommended SNF placement. His hospital course was uncomplicated and the patient was discharged to SNF on 09/13/2018. He will follow up in clinic with Dr. Venetia Maxon in 2 weeks.  No restrictions in activity or diet.   Medications at discharge Allergies as of 09/16/2018      Reactions   Flucloxacillin Anaphylaxis      Medication List    STOP taking these medications   BC HEADACHE POWDER PO   ibuprofen 800 MG tablet Commonly known as:  ADVIL,MOTRIN     TAKE these medications   butalbital-acetaminophen-caffeine 50-325-40 MG tablet Commonly known as:  FIORICET, ESGIC Take 1 tablet by mouth 2 (two) times daily as needed for headache.   cyanocobalamin 1000 MCG/ML injection Commonly known as:  (VITAMIN B-12) Inject 1,000 mcg into the muscle every 30 (thirty) days.   LEG CRAMP RELIEF PO Take 1 tablet by mouth daily as needed (Leg cramp).   Polyethyl Glycol-Propyl Glycol 0.4-0.3 % Soln Apply 1 drop to eye.   rizatriptan 10 MG tablet Commonly known as:  MAXALT Take 10 mg by mouth as needed for migraine. May repeat in 2 hours if needed   tadalafil 20 MG tablet Commonly known as:  ADCIRCA/CIALIS Take 20 mg by mouth daily as needed for erectile dysfunction.       Neurologic exam at discharge:  AOx3, PERRL, EOMI, FS Strength 5/5 x4, SILTx4  Jadene Pierini, MD 09/13/18 11:45 AM

## 2018-09-14 NOTE — Social Work (Addendum)
CSW left message with [facility]Kerri at [facility].  Await update regarding authorization/acceptance of pt.  10:54am- [facility] has declined placement for pt. Will speak with pt about additional facilities pt may be interested in. Will need insurance authorization prior to discharge.  11:40am- Spoke with pt at bedside, [facility] understands [facility]. We discussed the next steps of picking a facility. Pt has list with several towns/cities that he is interested in looking for SNFs at. [facility] [place] [facility] as a facility of interest. Pt gave permission for CSW to call his son [name] and [facility].  Spoke with pt son [place] via telephone, we discussed [facility] not having a bed and [facility]. Per son, he [place] [facility] [place] and [facility]. Discussed limitations to sending referrals out to various counties not on our HIPPA compliant hub. Pt son states understanding, [facility] [place] but also [facility] [place] [facility] not be able to offer a bed.   Called Shanda BumpsJessica [facility] [place]- they have beds available and will review referral faxed to [phone].  2:35pm- Spoke again with pt son [name], discussed offers and that [facility] [place] [facility] but is reviewing his clinicals. Pt son states if [facility] has offer [facility] [place] then they would like pt to have auth started. Pt son also mentioned [place], discussed that [facility] [facility] and does not often take referrals from outside of their community. [facility].  4:13pm- CSW received call from [facility], they reviewed and had additional clinical questions for CSW. Pt directed patient care questions to bedside RN Marena ChancyBianca. Will follow for final determination and to start insurance authorization.  Octavio GravesIsabel Lineth Thielke, MSW, [facility] [place] Clinical Social Work [phone]  [phone]

## 2018-09-14 NOTE — Progress Notes (Signed)
Neurosurgery Service Progress Note  Subjective: No acute events overnight, headache still improved today  Objective: Vitals:   09/13/18 2054 09/13/18 2233 09/13/18 2326 09/14/18 0400  BP:   (!) 146/80   Pulse: 77 92  60  Resp: 10 20  11   Temp:   98.3 F (36.8 C)   TempSrc:   Oral   SpO2: 99% 91% 98% 97%  Weight:      Height:       Temp (24hrs), Avg:98.1 F (36.7 C), Min:97.6 F (36.4 C), Max:98.6 F (37 C)  CBC Latest Ref Rng & Units 09/06/2018 09/05/2018  WBC 4.0 - 10.5 K/uL 5.1 6.3  Hemoglobin 13.0 - 17.0 g/dL 10.9(L) 11.7(L)  Hematocrit 39.0 - 52.0 % 33.3(L) 36.2(L)  Platelets 150 - 400 K/uL 115(L) 84(L)   BMP Latest Ref Rng & Units 09/06/2018 09/05/2018 05/24/2018  Glucose 70 - 99 mg/dL 161(W) 94 -  BUN 8 - 23 mg/dL 8 10 -  Creatinine 9.60 - 1.24 mg/dL 4.54(U) 9.81(X) 9.14(N)  Sodium 135 - 145 mmol/L 141 140 -  Potassium 3.5 - 5.1 mmol/L 3.4(L) 3.7 -  Chloride 98 - 111 mmol/L 113(H) 107 -  CO2 22 - 32 mmol/L 24 25 -  Calcium 8.9 - 10.3 mg/dL 8.3(L) 8.8(L) -    Intake/Output Summary (Last 24 hours) at 09/14/2018 0747 Last data filed at 09/14/2018 0247 Gross per 24 hour  Intake 360 ml  Output 2475 ml  Net -2115 ml    Current Facility-Administered Medications:  .  0.9 % NaCl with KCl 20 mEq/ L  infusion, , Intravenous, Continuous, Tia Alert, MD, Last Rate: 0 mL/hr at 09/12/18 0941 .  acetaminophen (TYLENOL) tablet 650 mg, 650 mg, Oral, Q4H PRN, 650 mg at 09/11/18 0523 **OR** acetaminophen (TYLENOL) suppository 650 mg, 650 mg, Rectal, Q4H PRN, Maeola Harman, MD .  bisacodyl (DULCOLAX) suppository 10 mg, 10 mg, Rectal, Daily PRN, Maeola Harman, MD .  Melene Muller ON 10/01/2018] cyanocobalamin ((VITAMIN B-12)) injection 1,000 mcg, 1,000 mcg, Intramuscular, Q30 days, Maeola Harman, MD .  docusate sodium (COLACE) capsule 100 mg, 100 mg, Oral, BID, Maeola Harman, MD, 100 mg at 09/13/18 2101 .  HYDROcodone-acetaminophen (NORCO/VICODIN) 5-325 MG per tablet 1 tablet, 1 tablet,  Oral, Q4H PRN, Maeola Harman, MD, 1 tablet at 09/13/18 2234 .  labetalol (NORMODYNE,TRANDATE) injection 10-40 mg, 10-40 mg, Intravenous, Q10 min PRN, Maeola Harman, MD .  MEDLINE mouth rinse, 15 mL, Mouth Rinse, BID, Maeola Harman, MD, 15 mL at 09/13/18 2103 .  morphine 2 MG/ML injection 1-2 mg, 1-2 mg, Intravenous, Q2H PRN, Maeola Harman, MD, 1 mg at 09/12/18 0853 .  ondansetron (ZOFRAN) tablet 4 mg, 4 mg, Oral, Q4H PRN **OR** ondansetron (ZOFRAN) injection 4 mg, 4 mg, Intravenous, Q4H PRN, Maeola Harman, MD, 4 mg at 09/10/18 0917 .  pantoprazole (PROTONIX) EC tablet 40 mg, 40 mg, Oral, QHS, Maeola Harman, MD, 40 mg at 09/13/18 2101 .  polyethylene glycol (MIRALAX / GLYCOLAX) packet 17 g, 17 g, Oral, Daily PRN, Maeola Harman, MD, 17 g at 09/13/18 0939 .  polyvinyl alcohol (LIQUIFILM TEARS) 1.4 % ophthalmic solution 1 drop, 1 drop, Both Eyes, BID PRN, Maeola Harman, MD .  promethazine (PHENERGAN) tablet 12.5-25 mg, 12.5-25 mg, Oral, Q4H PRN, Maeola Harman, MD .  sodium phosphate (FLEET) 7-19 GM/118ML enema 1 enema, 1 enema, Rectal, Once PRN, Maeola Harman, MD   Physical Exam: AOx3, PERRL, EOMI, FS, FCx4 without preference  Assessment & Plan: 72 y.o. man s/p drainage of SDH, recovering  well. -discharge to SNF pending -SCDs/TEDs  Jadene Pierinihomas A Ostergard  09/14/18 7:47 AM

## 2018-09-14 NOTE — Discharge Instructions (Signed)
Discharge Instructions  No restriction in activities, slowly increase your activity back to normal.   Your incision is closed with staples that will need to be removed in clinic at your follow up visit.   Okay to shower on the day of discharge. Be gentle when cleaning your incision. Use regular soap and water. If that is uncomfortable, try using baby shampoo. Do not submerge the wound under water for 2 weeks after surgery.  Follow up with Dr. Venetia MaxonStern in 2 weeks after discharge. If you do not already have a discharge appointment, please call his office at [phone] to schedule a follow up appointment. If you have any concerns or questions, please call the office and let us know.

## 2018-09-14 NOTE — Progress Notes (Signed)
Physical Therapy Treatment Patient Details Name: Brian Stark MRN: [id number] DOB: September 03, 1946 Today's Date: 09/14/2018    History of Present Illness 72 yo admitted with increased urinary frequency and legs giving out. 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 PMhx: bursitis, tendonitis, migraines    PT Comments    Pt with improved mobility this date despite 5/10 headache. Pt remains to have delayed response time, impaired short term memory, and impaired balance requiring RW for safe ambulation. Acute PT to cont to follow.   Follow Up Recommendations  SNF;Supervision for mobility/OOB     Equipment Recommendations  Rolling walker with 5" wheels    Recommendations for Other Services       Precautions / Restrictions Precautions Precautions: Fall Restrictions Weight Bearing Restrictions: No    Mobility  Bed Mobility Overal bed mobility: Modified Independent             General bed mobility comments: HOB elevated  Transfers Overall transfer level: Needs assistance Equipment used: Rolling walker (2 wheeled) Transfers: Sit to/from Stand Sit to Stand: Min guard         General transfer comment: verbal cues to push up from bed multiple times  Ambulation/Gait Ambulation/Gait assistance: Min guard;Min assist Gait Distance (Feet): 200 Feet Assistive device: Rolling walker (2 wheeled);None Gait Pattern/deviations: Step-through pattern;Decreased stride length;Trunk flexed Gait velocity: decreased Gait velocity interpretation: <1.31 ft/sec, indicative of household ambulator General Gait Details: pt began amb with rolling walker and demo'd smooth gait pattern and safe walker management. pt then amb 90' without AD, pt now with shortened, shuffled steps and very guarded with bilat UE elevated in protective response. Pt more steady and confident with RW.   Stairs             Wheelchair Mobility    Modified Rankin (Stroke Patients Only)       Balance Overall balance assessment: Needs assistance Sitting-balance support: Feet supported Sitting balance-Leahy Scale: Good     Standing balance support: No upper extremity supported;During functional activity Standing balance-Leahy Scale: Fair                              Cognition Arousal/Alertness: Awake/alert Behavior During Therapy: Flat affect Overall Cognitive Status: Impaired/Different from baseline                       Memory: Decreased short-term memory       Problem Solving: Slow processing General Comments: pt with appropriate conversation but delayed in processing      Exercises      General Comments General comments (skin integrity, edema, etc.): vss      Pertinent Vitals/Pain Pain Assessment: 0-10 Pain Score: 5  Pain Location: head Pain Descriptors / Indicators: Headache Pain Intervention(s): Monitored during session    Home Living                      Prior Function            PT Goals (current goals can now be found in the care plan section) Progress towards PT goals: Progressing toward goals    Frequency    Min 3X/week      PT Plan Current plan remains appropriate    Co-evaluation              AM-PAC PT "6 Clicks" Daily Activity  Outcome Measure  Difficulty turning over in bed (  including adjusting bedclothes, sheets and blankets)?: None Difficulty moving from lying on back to sitting on the side of the bed? : A Little Difficulty sitting down on and standing up from a chair with arms (e.g., wheelchair, bedside commode, etc,.)?: A Little Help needed moving to and from a bed to chair (including a wheelchair)?: A Little Help needed walking in hospital room?: A Little Help needed climbing 3-5 steps with a railing? : A Little 6 Click Score: 19    End of Session Equipment Utilized During Treatment: Gait belt Activity Tolerance: Patient tolerated treatment well Patient left: in chair;with call  bell/phone within reach Nurse Communication: Mobility status PT Visit Diagnosis: Other abnormalities of gait and mobility (R26.89);Difficulty in walking, not elsewhere classified (R26.2)     Time: 1610-9604 PT Time Calculation (min) (ACUTE ONLY): 26 min  Charges:  $Gait Training: 23-37 mins                     Lewis Shock, PT, DPT Acute Rehabilitation Services Pager #: [phone] Office #: [phone]    Brian Stark 09/14/2018, 1:56 PM

## 2018-09-14 NOTE — Plan of Care (Signed)
  Problem: Education: Goal: Knowledge of General Education information will improve Description: Including pain rating scale, medication(s)/side effects and non-pharmacologic comfort measures Outcome: Progressing   Problem: Activity: Goal: Risk for activity intolerance will decrease Outcome: Progressing   Problem: Nutrition: Goal: Adequate nutrition will be maintained Outcome: Progressing   Problem: Elimination: Goal: Will not experience complications related to bowel motility Outcome: Progressing   Problem: Elimination: Goal: Will not experience complications related to urinary retention Outcome: Progressing   Problem: Pain Managment: Goal: General experience of comfort will improve Outcome: Progressing   

## 2018-09-14 NOTE — Care Management Important Message (Signed)
Important Message  Patient Details  Name: Brian GashRalph G Stark MRN: [id number] Date of Birth: 1946-05-28   Medicare Important Message Given:  Yes    Dorena BodoIris Kerstie Agent 09/14/2018, 9:17 AM

## 2018-09-14 NOTE — Progress Notes (Signed)
Occupational Therapy Treatment Patient Details Name: Brian Stark MRN: [id number] DOB: 1946-04-20 Today's Date: 09/14/2018    History of present illness 72 yo admitted with increased urinary frequency and legs giving out. 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 PMhx: bursitis, tendonitis, migraines   OT comments  Pt progressing towards established OT goals. Pt performing grooming at sink with Min Guard A for safety. Pt with single LOB during standing balance and requiring Min A for correction. Pt performing hallway distance functional mobility with Min A and single hand held A. Pt continue to present with decreased processing and requiring increased time and cues. Continue to recommend dc to SNF and will continue to follow acutely as admitted.    Follow Up Recommendations  SNF;Supervision/Assistance - 24 hour    Equipment Recommendations  Other (comment)(TBD at next venue of care)    Recommendations for Other Services      Precautions / Restrictions Precautions Precautions: Fall Restrictions Weight Bearing Restrictions: No       Mobility Bed Mobility               General bed mobility comments: In recliner  Transfers Overall transfer level: Needs assistance Equipment used: Rolling walker (2 wheeled) Transfers: Sit to/from Stand Sit to Stand: Min guard         General transfer comment: verbal cues for hand placement    Balance Overall balance assessment: Needs assistance Sitting-balance support: Feet supported Sitting balance-Leahy Scale: Good     Standing balance support: No upper extremity supported;During functional activity Standing balance-Leahy Scale: Fair                             ADL either performed or assessed with clinical judgement   ADL Overall ADL's : Needs assistance/impaired     Grooming: Wash/dry hands;Oral care;Min guard;Standing Grooming Details (indicate cue type and reason): Min Guard A for  safety             Lower Body Dressing: Minimal assistance;Sit to/from stand Lower Body Dressing Details (indicate cue type and reason): Min A for standing balance. Provided pt with education on compensatory techniques for LB dressing to bring ankles to knees. Pt demonstrating understanding Toilet Transfer: Min guard;Ambulation;Minimal assistance(simulated to recliner )           Functional mobility during ADLs: Minimal assistance(singe hand held A) General ADL Comments: Pt motivated to participate in therapy     Vision   Vision Assessment?: No apparent visual deficits   Perception     Praxis      Cognition Arousal/Alertness: Awake/alert Behavior During Therapy: Flat affect Overall Cognitive Status: Impaired/Different from baseline Area of Impairment: Problem solving;Memory                     Memory: Decreased short-term memory       Problem Solving: Slow processing;Requires verbal cues General Comments: Pt requiring increased cues and time during session and ADLs. Pt with awareness of decreased problem solving and stating "I know I am not as sharp right now"        Exercises     Shoulder Instructions       General Comments VSS    Pertinent Vitals/ Pain       Pain Assessment: Faces Faces Pain Scale: Hurts little more Pain Location: head Pain Descriptors / Indicators: Headache Pain Intervention(s): Monitored during session;Limited activity within patient's tolerance;Repositioned  Home Living  Prior Functioning/Environment              Frequency  Min 2X/week        Progress Toward Goals  OT Goals(current goals can now be found in the care plan section)  Progress towards OT goals: Progressing toward goals  Acute Rehab OT Goals Patient Stated Goal: be able to get stronger and go home OT Goal Formulation: With patient Time For Goal Achievement: 09/22/18 Potential to Achieve  Goals: Good ADL Goals Pt Will Perform Grooming: with modified independence;standing Pt Will Perform Lower Body Bathing: with modified independence;sit to/from stand Pt Will Perform Lower Body Dressing: with modified independence;sit to/from stand Pt Will Transfer to Toilet: with modified independence;ambulating;regular height toilet Additional ADL Goal #1: Pt will complete follow 3 step command and recall task with independence in moderately distracting enviorment.  Plan Discharge plan remains appropriate    Co-evaluation                 AM-PAC PT "6 Clicks" Daily Activity     Outcome Measure   Help from another person eating meals?: None Help from another person taking care of personal grooming?: A Little Help from another person toileting, which includes using toliet, bedpan, or urinal?: A Little Help from another person bathing (including washing, rinsing, drying)?: A Little Help from another person to put on and taking off regular upper body clothing?: A Little Help from another person to put on and taking off regular lower body clothing?: A Little 6 Click Score: 19    End of Session Equipment Utilized During Treatment: Gait belt  OT Visit Diagnosis: Other abnormalities of gait and mobility (R26.89);Muscle weakness (generalized) (M62.81);Pain Pain - part of body: (headache)   Activity Tolerance Patient tolerated treatment well   Patient Left in chair;with call bell/phone within reach;with chair alarm set   Nurse Communication Mobility status        Time: 1610-9604 OT Time Calculation (min): 24 min  Charges: OT General Charges $OT Visit: 1 Visit OT Treatments $Self Care/Home Management : 23-37 mins  Timaya Bojarski MSOT, OTR/L Acute Rehab Pager: [phone] Office: [phone]   Theodoro Grist Aaliyha Mumford 09/14/2018, 5:10 PM

## 2018-09-15 NOTE — Plan of Care (Signed)
  Problem: Education: Goal: Knowledge of General Education information will improve Description: Including pain rating scale, medication(s)/side effects and non-pharmacologic comfort measures Outcome: Progressing   Problem: Activity: Goal: Risk for activity intolerance will decrease Outcome: Progressing   Problem: Nutrition: Goal: Adequate nutrition will be maintained Outcome: Progressing   

## 2018-09-15 NOTE — Progress Notes (Signed)
Neurosurgery Service Progress Note  Subjective: No acute events overnight, headache stable  Objective: Vitals:   09/14/18 1957 09/15/18 0000 09/15/18 0333 09/15/18 0800  BP: 122/73 133/78 139/75 (!) 143/84  Pulse:      Resp: 16 11 11 13   Temp: 98.5 F (36.9 C) 98.4 F (36.9 C) 98.3 F (36.8 C) 98 F (36.7 C)  TempSrc: Oral Oral Oral Oral  SpO2: 99%     Weight:      Height:       Temp (24hrs), Avg:98.5 F (36.9 C), Min:98 F (36.7 C), Max:99.1 F (37.3 C)  CBC Latest Ref Rng & Units 09/06/2018 09/05/2018  WBC 4.0 - 10.5 K/uL 5.1 6.3  Hemoglobin 13.0 - 17.0 g/dL 10.9(L) 11.7(L)  Hematocrit 39.0 - 52.0 % 33.3(L) 36.2(L)  Platelets 150 - 400 K/uL 115(L) 84(L)   BMP Latest Ref Rng & Units 09/06/2018 09/05/2018 05/24/2018  Glucose 70 - 99 mg/dL 914(N) 94 -  BUN 8 - 23 mg/dL 8 10 -  Creatinine 8.29 - 1.24 mg/dL 5.62(Z) 3.08(M) 5.78(I)  Sodium 135 - 145 mmol/L 141 140 -  Potassium 3.5 - 5.1 mmol/L 3.4(L) 3.7 -  Chloride 98 - 111 mmol/L 113(H) 107 -  CO2 22 - 32 mmol/L 24 25 -  Calcium 8.9 - 10.3 mg/dL 8.3(L) 8.8(L) -    Intake/Output Summary (Last 24 hours) at 09/15/2018 0845 Last data filed at 09/15/2018 0758 Gross per 24 hour  Intake 1200 ml  Output 2700 ml  Net -1500 ml    Current Facility-Administered Medications:  .  0.9 % NaCl with KCl 20 mEq/ L  infusion, , Intravenous, Continuous, Tia Alert, MD, Last Rate: 0 mL/hr at 09/12/18 0941 .  acetaminophen (TYLENOL) tablet 650 mg, 650 mg, Oral, Q4H PRN, 650 mg at 09/14/18 2200 **OR** acetaminophen (TYLENOL) suppository 650 mg, 650 mg, Rectal, Q4H PRN, Maeola Harman, MD .  bisacodyl (DULCOLAX) suppository 10 mg, 10 mg, Rectal, Daily PRN, Maeola Harman, MD .  Melene Muller ON 10/01/2018] cyanocobalamin ((VITAMIN B-12)) injection 1,000 mcg, 1,000 mcg, Intramuscular, Q30 days, Maeola Harman, MD .  docusate sodium (COLACE) capsule 100 mg, 100 mg, Oral, BID, Maeola Harman, MD, 100 mg at 09/14/18 2156 .  HYDROcodone-acetaminophen  (NORCO/VICODIN) 5-325 MG per tablet 1 tablet, 1 tablet, Oral, Q4H PRN, Maeola Harman, MD, 1 tablet at 09/15/18 [id number] .  labetalol (NORMODYNE,TRANDATE) injection 10-40 mg, 10-40 mg, Intravenous, Q10 min PRN, Maeola Harman, MD .  MEDLINE mouth rinse, 15 mL, Mouth Rinse, BID, Maeola Harman, MD, 15 mL at 09/14/18 2157 .  morphine 2 MG/ML injection 1-2 mg, 1-2 mg, Intravenous, Q2H PRN, Maeola Harman, MD, 1 mg at 09/12/18 0853 .  ondansetron (ZOFRAN) tablet 4 mg, 4 mg, Oral, Q4H PRN **OR** ondansetron (ZOFRAN) injection 4 mg, 4 mg, Intravenous, Q4H PRN, Maeola Harman, MD, 4 mg at 09/10/18 0917 .  pantoprazole (PROTONIX) EC tablet 40 mg, 40 mg, Oral, QHS, Maeola Harman, MD, 40 mg at 09/14/18 2156 .  polyethylene glycol (MIRALAX / GLYCOLAX) packet 17 g, 17 g, Oral, Daily PRN, Maeola Harman, MD, 17 g at 09/13/18 0939 .  polyvinyl alcohol (LIQUIFILM TEARS) 1.4 % ophthalmic solution 1 drop, 1 drop, Both Eyes, BID PRN, Maeola Harman, MD .  promethazine (PHENERGAN) tablet 12.5-25 mg, 12.5-25 mg, Oral, Q4H PRN, Maeola Harman, MD .  sodium phosphate (FLEET) 7-19 GM/118ML enema 1 enema, 1 enema, Rectal, Once PRN, Maeola Harman, MD   Physical Exam: AOx3, PERRL, EOMI, FS, FCx4 without preference  Assessment & Plan: 72  y.o. man s/p drainage of SDH, recovering well. -discharge to different SNF pending, first did not have bed available  -SCDs/TEDs  Jadene Pierinihomas A Mareo Portilla  09/15/18 8:45 AM

## 2018-09-15 NOTE — Social Work (Signed)
Spoke with pt son, updated him with information regarding SNF placement. [facility]. Await insurance determination.   Octavio GravesIsabel Joylynn Defrancesco, MSW, [facility] [place] Clinical Social Work [phone]

## 2018-09-16 NOTE — Social Work (Signed)
Clinical Social Worker facilitated patient discharge including contacting patient family and facility to confirm patient discharge plans.  Clinical information faxed to facility and family agreeable with plan.  CSW [facility] to call [phone]  with report prior to discharge.  Clinical Social Worker will sign off for now as social work intervention is no longer needed. Please consult us again if new need arises.  Doy HutchingIsabel H Taj Nevins, Connecticut[place]CS[place] Clinical Social Worker [phone]

## 2018-09-16 NOTE — Progress Notes (Signed)
Physical Therapy Treatment Patient Details Name: Brian GashRalph G Stark MRN: [id number] DOB: [birth year] Today's Date: 09/16/2018    History of Present Illness 72 yo admitted with increased urinary frequency and legs giving out. 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 PMhx: bursitis, tendonitis, migraines    PT Comments    Pt pleasant with flat affect and reports still feeling limited by weakness in bil knees, thighs and confusion. Pt states his long term plan is to move into one story home of mom. Pt educated for HEP, progression of gait and ability to begin walking without RW with supervision to regain balance, confidence and endurance. SNF remains appropriate Access Code: [email or web address]  URL: [email or web address]  Date: 09/16/2018  Prepared by: Brian LeepMaija  Exercises  Seated March - 15 reps - 1 sets - 3x daily - 7x weekly  Seated Long Arc Quad - 15 reps - 1 sets - 3x daily - 7x weekly  Seated Heel Raise - 20 reps - 1 sets - 3x daily - 7x weekly  Seated Heel Toe Raises - 20 reps - 1 sets - 3x daily - 7x weekly  Sit to Stand - 5 reps - 1 sets - 3x daily - 7x weekly      Follow Up Recommendations  SNF;Supervision for mobility/OOB     Equipment Recommendations  Rolling walker with 5" wheels    Recommendations for Other Services       Precautions / Restrictions Precautions Precautions: Fall    Mobility  Bed Mobility Overal bed mobility: Modified Independent                Transfers Overall transfer level: Needs assistance   Transfers: Sit to/from Stand Sit to Stand: Min guard         General transfer comment: good hand placement with guarding for balance. Performed 5 repeated sit<>stand  Ambulation/Gait Ambulation/Gait assistance: Min guard Gait Distance (Feet): 150 Feet Assistive device: Rolling walker (2 wheeled);None Gait Pattern/deviations: Step-through pattern;Decreased stride length   Gait velocity interpretation: 1.31 - 2.62  ft/sec, indicative of limited community ambulator General Gait Details: forward head with use of RW for 150' then pt able to walk 50' without gait with slow gait but steady walk. pt remains reliant on RW for gait for own confidence but able to walk with only minguard with and without AD today   Stairs             Wheelchair Mobility    Modified Rankin (Stroke Patients Only)       Balance Overall balance assessment: Needs assistance   Sitting balance-Leahy Scale: Good       Standing balance-Leahy Scale: Good       Tandem Stance - Right Leg: 20 Tandem Stance - Left Leg: 20 [place] - Eyes Opened: 60     High Level Balance Comments: pt able to stand with tandem stance with assist to achieve position and guarding throughout as pt with increased unsteadiness with fatigue            Cognition Arousal/Alertness: Awake/alert Behavior During Therapy: Flat affect Overall Cognitive Status: Impaired/Different from baseline Area of Impairment: Problem solving;Memory                     Memory: Decreased short-term memory       Problem Solving: Slow processing;Requires verbal cues General Comments: pt with slow processing and times delayed word finding      Exercises General Exercises - Lower Extremity Long Arc  Quad: AROM;15 reps;Seated;Both Hip Flexion/Marching: AROM;15 reps;Seated;Both Other Exercises Other Exercises: sit to stand x 5 repeated from chair     General Comments        Pertinent Vitals/Pain Pain Score: 4  Pain Location: head Pain Descriptors / Indicators: Aching Pain Intervention(s): Monitored during session;Premedicated before session    Home Living                      Prior Function            PT Goals (current goals can now be found in the care plan section) Progress towards PT goals: Progressing toward goals    Frequency           PT Plan Current plan remains appropriate    Co-evaluation               AM-PAC PT "6 Clicks" Daily Activity  Outcome Measure  Difficulty turning over in bed (including adjusting bedclothes, sheets and blankets)?: None Difficulty moving from lying on back to sitting on the side of the bed? : A Little Difficulty sitting down on and standing up from a chair with arms (e.g., wheelchair, bedside commode, etc,.)?: A Little Help needed moving to and from a bed to chair (including a wheelchair)?: A Little Help needed walking in hospital room?: A Little Help needed climbing 3-5 steps with a railing? : A Little 6 Click Score: 19    End of Session   Activity Tolerance: Patient tolerated treatment well Patient left: with call bell/phone within reach;Other (comment)(at toilet with NT outside room and aware) Nurse Communication: Mobility status PT Visit Diagnosis: Other abnormalities of gait and mobility (R26.89);Difficulty in walking, not elsewhere classified (R26.2)     Time: 8119-1478 PT Time Calculation (min) (ACUTE ONLY): 23 min  Charges:  $Gait Training: 8-22 mins $Neuromuscular Re-education: 8-22 mins                     Brian Stark Brian Stark, PT Acute Rehabilitation Services Pager: [phone] Office: [phone]    Brian Stark B Brian Stark 09/16/2018, 11:02 AM

## 2018-09-16 NOTE — Clinical Social Work Placement (Signed)
   CLINICAL SOCIAL WORK PLACEMENT  NOTE [facility] RN to call report to [phone]  Date:  09/16/2018  Patient Details  Name: Brian Stark MRN: [id number] Date of Birth: 03-12-1946  Clinical Social Work is seeking post-discharge placement for this patient at the Skilled  Nursing Facility level of care (*CSW will initial, date and re-position this form in  chart as items are completed):  Yes   Patient/family provided with [place] Clinical Social Work Department's list of facilities offering this level of care within the geographic area requested by the patient (or if unable, by the patient's family).  Yes   Patient/family informed of their freedom to choose among providers that offer the needed level of care, that participate in Medicare, Medicaid or managed care program needed by the patient, have an available bed and are willing to accept the patient.  Yes   Patient/family informed of [facility]'s ownership interest in [facility] and [facility], as well as of the fact that they are under no obligation to receive care at these facilities.  PASRR submitted to EDS on       PASRR number received on       Existing PASRR number confirmed on 09/10/18     FL2 transmitted to all facilities in geographic area requested by pt/family on 09/16/18     FL2 transmitted to all facilities within larger geographic area on 09/14/18     Patient informed that his/her managed care company has contracts with or will negotiate with certain facilities, including the following:        Yes   Patient/family informed of bed offers received.  [facility]     Physician recommends and patient chooses bed at      [facility]  Patient to be transferred to facility by [facility]     Patient family notified on 09/16/18 of transfer.  Name of family member notified:  pt son [name]     PHYSICIAN Please  prepare priority discharge summary, including medications, Please prepare prescriptions     Additional Comment:    _______________________________________________ Doy HutchingIsabel H Trenda Corliss, LCSWA 09/16/2018, 2:15 PM

## 2018-09-16 NOTE — Progress Notes (Signed)
Neurosurgery Service Progress Note  Subjective: No acute events overnight, headache stable  Objective: Vitals:   09/15/18 2000 09/16/18 0000 09/16/18 0359 09/16/18 0400  BP: 125/71 (!) 148/79 (!) 149/81 (!) 149/81  Pulse:      Resp: (!) 22 14 14 17   Temp: 98.3 F (36.8 C) 98.3 F (36.8 C)  98 F (36.7 C)  TempSrc: Oral Oral  Oral  SpO2:      Weight:      Height:       Temp (24hrs), Avg:98.2 F (36.8 C), Min:97.7 F (36.5 C), Max:98.6 F (37 C)  CBC Latest Ref Rng & Units 09/06/2018 09/05/2018  WBC 4.0 - 10.5 K/uL 5.1 6.3  Hemoglobin 13.0 - 17.0 g/dL 10.9(L) 11.7(L)  Hematocrit 39.0 - 52.0 % 33.3(L) 36.2(L)  Platelets 150 - 400 K/uL 115(L) 84(L)   BMP Latest Ref Rng & Units 09/06/2018 09/05/2018 05/24/2018  Glucose 70 - 99 mg/dL 161(W114(H) 94 -  BUN 8 - 23 mg/dL 8 10 -  Creatinine 9.600.61 - 1.24 mg/dL 4.54(U1.29(H) 9.81(X1.33(H) 9.14(N1.60(H)  Sodium 135 - 145 mmol/L 141 140 -  Potassium 3.5 - 5.1 mmol/L 3.4(L) 3.7 -  Chloride 98 - 111 mmol/L 113(H) 107 -  CO2 22 - 32 mmol/L 24 25 -  Calcium 8.9 - 10.3 mg/dL 8.3(L) 8.8(L) -    Intake/Output Summary (Last 24 hours) at 09/16/2018 0754 Last data filed at 09/16/2018 0300 Gross per 24 hour  Intake 480 ml  Output 1200 ml  Net -720 ml    Current Facility-Administered Medications:  .  0.9 % NaCl with KCl 20 mEq/ L  infusion, , Intravenous, Continuous, Tia AlertJones, David S, MD, Last Rate: 0 mL/hr at 09/12/18 0941 .  acetaminophen (TYLENOL) tablet 650 mg, 650 mg, Oral, Q4H PRN, 650 mg at 09/14/18 2200 **OR** acetaminophen (TYLENOL) suppository 650 mg, 650 mg, Rectal, Q4H PRN, Maeola HarmanStern, Joseph, MD .  bisacodyl (DULCOLAX) suppository 10 mg, 10 mg, Rectal, Daily PRN, Maeola HarmanStern, Joseph, MD .  Melene Muller[START ON 10/01/2018] cyanocobalamin ((VITAMIN B-12)) injection 1,000 mcg, 1,000 mcg, Intramuscular, Q30 days, Maeola HarmanStern, Joseph, MD .  docusate sodium (COLACE) capsule 100 mg, 100 mg, Oral, BID, Maeola HarmanStern, Joseph, MD, 100 mg at 09/15/18 2231 .  HYDROcodone-acetaminophen (NORCO/VICODIN)  5-325 MG per tablet 1 tablet, 1 tablet, Oral, Q4H PRN, Maeola HarmanStern, Joseph, MD, 1 tablet at 09/16/18 0358 .  labetalol (NORMODYNE,TRANDATE) injection 10-40 mg, 10-40 mg, Intravenous, Q10 min PRN, Maeola HarmanStern, Joseph, MD .  MEDLINE mouth rinse, 15 mL, Mouth Rinse, BID, Maeola HarmanStern, Joseph, MD, 15 mL at 09/15/18 2231 .  morphine 2 MG/ML injection 1-2 mg, 1-2 mg, Intravenous, Q2H PRN, Maeola HarmanStern, Joseph, MD, 1 mg at 09/12/18 0853 .  ondansetron (ZOFRAN) tablet 4 mg, 4 mg, Oral, Q4H PRN **OR** ondansetron (ZOFRAN) injection 4 mg, 4 mg, Intravenous, Q4H PRN, Maeola HarmanStern, Joseph, MD, 4 mg at 09/10/18 0917 .  pantoprazole (PROTONIX) EC tablet 40 mg, 40 mg, Oral, QHS, Maeola HarmanStern, Joseph, MD, 40 mg at 09/15/18 2231 .  polyethylene glycol (MIRALAX / GLYCOLAX) packet 17 g, 17 g, Oral, Daily PRN, Maeola HarmanStern, Joseph, MD, 17 g at 09/13/18 0939 .  polyvinyl alcohol (LIQUIFILM TEARS) 1.4 % ophthalmic solution 1 drop, 1 drop, Both Eyes, BID PRN, Maeola HarmanStern, Joseph, MD .  promethazine (PHENERGAN) tablet 12.5-25 mg, 12.5-25 mg, Oral, Q4H PRN, Maeola HarmanStern, Joseph, MD .  sodium phosphate (FLEET) 7-19 GM/118ML enema 1 enema, 1 enema, Rectal, Once PRN, Maeola HarmanStern, Joseph, MD   Physical Exam: AOx3, PERRL, EOMI, FS, FCx4 without preference  Assessment & Plan: 72  y.o. man s/p drainage of SDH, recovering well. -discharge to different SNF pending insurance approval -SCDs/TEDs  Jadene Pierini  09/16/18 7:54 AM

## 2018-09-16 NOTE — Social Work (Addendum)
[facility] still pending.   2:05pm- Therapist, occupationalnsurance approval received, Dr. Maurice Smallstergard paged for updated summary with medications, as well as any printed prescriptions for controlled substances. When all information is received pt will be able to discharge.  Octavio GravesIsabel Anh Bigos, MSW, [facility] [place] Clinical Social Work [phone]

## 2018-09-16 NOTE — Progress Notes (Signed)
[facility], Engineer, civil (consulting)urse. All questions were answered. Unit number provided for questions.

## 2018-09-17 DIAGNOSIS — E538 Deficiency of other specified B group vitamins: Secondary | ICD-10-CM | POA: Insufficient documentation

## 2018-09-17 DIAGNOSIS — G44009 Cluster headache syndrome, unspecified, not intractable: Secondary | ICD-10-CM | POA: Insufficient documentation

## 2018-10-13 ENCOUNTER — Other Ambulatory Visit (HOSPITAL_COMMUNITY): Payer: Self-pay | Admitting: Neurosurgery

## 2018-10-13 DIAGNOSIS — S065X9A Traumatic subdural hemorrhage with loss of consciousness of unspecified duration, initial encounter: Secondary | ICD-10-CM

## 2018-10-13 DIAGNOSIS — S065XAA Traumatic subdural hemorrhage with loss of consciousness status unknown, initial encounter: Secondary | ICD-10-CM

## 2018-10-21 ENCOUNTER — Ambulatory Visit (HOSPITAL_COMMUNITY)
Admission: RE | Admit: 2018-10-21 | Discharge: 2018-10-21 | Disposition: A | Discharge status: Home / Self Care | Payer: Medicare Other | Source: Ambulatory Visit | Attending: Neurosurgery | Admitting: Neurosurgery

## 2018-10-21 DIAGNOSIS — S065XAA Traumatic subdural hemorrhage with loss of consciousness status unknown, initial encounter: Secondary | ICD-10-CM

## 2018-10-21 DIAGNOSIS — S065X9A Traumatic subdural hemorrhage with loss of consciousness of unspecified duration, initial encounter: Secondary | ICD-10-CM | POA: Insufficient documentation

## 2019-04-08 ENCOUNTER — Other Ambulatory Visit: Payer: Medicare Other

## 2019-04-08 ENCOUNTER — Other Ambulatory Visit: Payer: Self-pay

## 2019-04-08 DIAGNOSIS — Z20822 Contact with and (suspected) exposure to covid-19: Secondary | ICD-10-CM

## 2019-04-11 LAB — NOVEL CORONAVIRUS, NAA: SARS-CoV-2, NAA: NOT DETECTED

## 2019-09-19 ENCOUNTER — Other Ambulatory Visit (HOSPITAL_COMMUNITY): Payer: Self-pay | Admitting: Internal Medicine

## 2019-09-19 ENCOUNTER — Ambulatory Visit (HOSPITAL_COMMUNITY)
Admission: RE | Admit: 2019-09-19 | Discharge: 2019-09-19 | Disposition: A | Discharge status: Home / Self Care | Payer: Medicare Other | Source: Ambulatory Visit | Attending: Internal Medicine | Admitting: Internal Medicine

## 2019-09-19 ENCOUNTER — Other Ambulatory Visit: Payer: Self-pay

## 2019-09-19 ENCOUNTER — Other Ambulatory Visit: Payer: Self-pay | Admitting: Internal Medicine

## 2019-09-19 DIAGNOSIS — R17 Unspecified jaundice: Secondary | ICD-10-CM

## 2019-10-15 ENCOUNTER — Encounter (HOSPITAL_COMMUNITY): Payer: Self-pay | Admitting: Emergency Medicine

## 2019-10-15 ENCOUNTER — Other Ambulatory Visit: Payer: Self-pay

## 2019-10-15 ENCOUNTER — Inpatient Hospital Stay (HOSPITAL_COMMUNITY)
Admission: EM | Admit: 2019-10-15 | Discharge: 2019-10-17 | DRG: 176 | Disposition: A | Discharge status: Home / Self Care | Payer: Medicare Other | Attending: Family Medicine | Admitting: Family Medicine

## 2019-10-15 DIAGNOSIS — Z79899 Other long term (current) drug therapy: Secondary | ICD-10-CM

## 2019-10-15 DIAGNOSIS — Z8701 Personal history of pneumonia (recurrent): Secondary | ICD-10-CM

## 2019-10-15 DIAGNOSIS — R042 Hemoptysis: Secondary | ICD-10-CM | POA: Diagnosis present

## 2019-10-15 DIAGNOSIS — F17200 Nicotine dependence, unspecified, uncomplicated: Secondary | ICD-10-CM | POA: Diagnosis present

## 2019-10-15 DIAGNOSIS — D696 Thrombocytopenia, unspecified: Secondary | ICD-10-CM | POA: Diagnosis present

## 2019-10-15 DIAGNOSIS — I2699 Other pulmonary embolism without acute cor pulmonale: Secondary | ICD-10-CM | POA: Diagnosis not present

## 2019-10-15 DIAGNOSIS — Z20828 Contact with and (suspected) exposure to other viral communicable diseases: Secondary | ICD-10-CM | POA: Diagnosis present

## 2019-10-15 DIAGNOSIS — Z880 Allergy status to penicillin: Secondary | ICD-10-CM

## 2019-10-15 LAB — CBC WITH DIFFERENTIAL/PLATELET
Abs Immature Granulocytes: 0.14 10*3/uL — ABNORMAL HIGH (ref 0.00–0.07)
Basophils Absolute: 0 10*3/uL (ref 0.0–0.1)
Basophils Relative: 0 %
Eosinophils Absolute: 0 10*3/uL (ref 0.0–0.5)
Eosinophils Relative: 0 %
HCT: 39.3 % (ref 39.0–52.0)
Hemoglobin: 13 g/dL (ref 13.0–17.0)
Immature Granulocytes: 1 %
Lymphocytes Relative: 4 %
Lymphs Abs: 0.4 10*3/uL — ABNORMAL LOW (ref 0.7–4.0)
MCH: 35.7 pg — ABNORMAL HIGH (ref 26.0–34.0)
MCHC: 33.1 g/dL (ref 30.0–36.0)
MCV: 108 fL — ABNORMAL HIGH (ref 80.0–100.0)
Monocytes Absolute: 1.4 10*3/uL — ABNORMAL HIGH (ref 0.1–1.0)
Monocytes Relative: 12 %
Neutro Abs: 9.6 10*3/uL — ABNORMAL HIGH (ref 1.7–7.7)
Neutrophils Relative %: 83 %
Platelets: 80 10*3/uL — ABNORMAL LOW (ref 150–400)
RBC: 3.64 MIL/uL — ABNORMAL LOW (ref 4.22–5.81)
RDW: 14.6 % (ref 11.5–15.5)
WBC: 11.6 10*3/uL — ABNORMAL HIGH (ref 4.0–10.5)
nRBC: 0 % (ref 0.0–0.2)

## 2019-10-15 LAB — BASIC METABOLIC PANEL
Anion gap: 9 (ref 5–15)
BUN: 17 mg/dL (ref 8–23)
CO2: 24 mmol/L (ref 22–32)
Calcium: 9.5 mg/dL (ref 8.9–10.3)
Chloride: 100 mmol/L (ref 98–111)
Creatinine, Ser: 1.33 mg/dL — ABNORMAL HIGH (ref 0.61–1.24)
GFR calc Af Amer: >60 mL/min (ref >60)
GFR calc non Af Amer: 53 mL/min — ABNORMAL LOW (ref >60)
Glucose, Bld: 113 mg/dL — ABNORMAL HIGH (ref 70–99)
Potassium: 3.5 mmol/L (ref 3.5–5.1)
Sodium: 133 mmol/L — ABNORMAL LOW (ref 135–145)

## 2019-10-15 NOTE — ED Provider Notes (Signed)
[facility] Emergency Department Provider Note MRN:  [id number]  Arrival date & time: 10/16/19     Chief Complaint   Hemoptysis History of Present Illness   Brian Stark is a 73 y.o. year-old male with no pertinent past medical history presenting to the ED with chief complaint of hemoptysis.  Patient noticed some blood in his mouth as he was clearing his throat today.  Has happened multiple times since the afternoon.  Patient was diagnosed with pneumonia last week and has been taking antibiotics and steroids.  He denies any chest pain or shortness of breath.  Denies headache or vision change, denies any trauma, no abdominal pain, no fever.  Small amount of blood.  Symptoms mild.  Denies vomiting blood.  Denies blood thinners.  Review of Systems  A complete 10 system review of systems was obtained and all systems are negative except as noted in the HPI and PMH.   Patient's Health History    Past Medical History:  Diagnosis Date  . Bursitis    l shoulder  . Migraines   . Rheumatic fever   . Tendonitis    L shoulder    Past Surgical History:  Procedure Laterality Date  . ABDOMINAL SURGERY    . CATARACT EXTRACTION, BILATERAL    . CHOLECYSTECTOMY    . CRANIOTOMY Bilateral 09/06/2018   Procedure: BILATERAL CRANIOTOMIES FOR SUBDURAL HEMATOMA EVACUATION;  Surgeon: Erline Levine, MD;  Location: [facility];  Service: Neurosurgery;  Laterality: Bilateral;  . EYE SURGERY      History reviewed. No pertinent family history.  Social History   Socioeconomic History  . Marital status: Widowed    Spouse name: Not on file  . Number of children: Not on file  . Years of education: Not on file  . Highest education level: Not on file  Occupational History  . Not on file  Tobacco Use  . Smoking status: Current Every Day Smoker  . Smokeless tobacco: Never Used  Substance and Sexual Activity  . Alcohol use: Yes    Comment: daily-beer 2 cans  . Drug use: Never  .  Sexual activity: Not on file  Other Topics Concern  . Not on file  Social History Narrative  . Not on file   Social Determinants of Health   Financial Resource Strain:   . Difficulty of Paying Living Expenses: Not on file  Food Insecurity:   . Worried About Charity fundraiser in the Last Year: Not on file  . Ran Out of Food in the Last Year: Not on file  Transportation Needs:   . Lack of Transportation (Medical): Not on file  . Lack of Transportation (Non-Medical): Not on file  Physical Activity:   . Days of Exercise per Week: Not on file  . Minutes of Exercise per Session: Not on file  Stress:   . Feeling of Stress : Not on file  Social Connections:   . Frequency of Communication with Friends and Family: Not on file  . Frequency of Social Gatherings with Friends and Family: Not on file  . Attends Religious Services: Not on file  . Active Member of Clubs or Organizations: Not on file  . Attends Archivist Meetings: Not on file  . Marital Status: Not on file  Intimate Partner Violence:   . Fear of Current or Ex-Partner: Not on file  . Emotionally Abused: Not on file  . Physically Abused: Not on file  . Sexually Abused: Not  on file     Physical Exam  Vital Signs and Nursing Notes reviewed Vitals:   10/15/19 1942 10/15/19 2342  BP: (!) 152/86   Pulse: (!) 117 (!) 107  Resp: 20 20  Temp: 98.7 F (37.1 C)   SpO2: 99% 100%    CONSTITUTIONAL: Well-appearing, NAD NEURO:  Alert and oriented x 3, no focal deficits EYES:  eyes equal and reactive ENT/NECK:  no LAD, no JVD CARDIO: Tachycardic rate, well-perfused, normal S1 and S2 PULM:  CTAB no wheezing or rhonchi GI/GU:  normal bowel sounds, non-distended, non-tender MSK/SPINE:  No gross deformities, no edema SKIN:  no rash, atraumatic PSYCH:  Appropriate speech and behavior  Diagnostic and Interventional Summary    EKG Interpretation  Date/Time:  Sunday October 16 2019 01:32:35 EST Ventricular Rate:   103 PR Interval:  164 QRS Duration: 88 QT Interval:  334 QTC Calculation: 437 R Axis:   -53 Text Interpretation: Sinus tachycardia Left anterior fascicular block Minimal voltage criteria for LVH, may be normal variant Abnormal ECG No significant change was found Confirmed by Kennis CarinaBero, Decklin Weddington [phone]) on 10/16/2019 2:17:45 AM      Labs Reviewed  CBC WITH DIFFERENTIAL/PLATELET - Abnormal; Notable for the following components:      Result Value   WBC 11.6 (*)    RBC 3.64 (*)    MCV 108.0 (*)    MCH 35.7 (*)    Platelets 80 (*)    Neutro Abs 9.6 (*)    Lymphs Abs 0.4 (*)    Monocytes Absolute 1.4 (*)    Abs Immature Granulocytes 0.14 (*)    All other components within normal limits  BASIC METABOLIC PANEL - Abnormal; Notable for the following components:   Sodium 133 (*)    Glucose, Bld 113 (*)    Creatinine, Ser 1.33 (*)    GFR calc non Af Amer 53 (*)    All other components within normal limits  D-DIMER, QUANTITATIVE (NOT AT [facility]) - Abnormal; Notable for the following components:   D-Dimer, Quant 2.70 (*)    All other components within normal limits  SARS CORONAVIRUS 2 (TAT 6-24 HRS)    CTA Chest for PE  Final Result      Medications  iohexol (OMNIPAQUE) 350 MG/ML injection 100 mL (100 mLs Intravenous Contrast Given 10/16/19 0147)     Procedures  /  Critical Care .Critical Care Performed by: Sabas SousBero, Rosha Cocker M, MD Authorized by: Sabas SousBero, Janis Cuffe M, MD   Critical care provider statement:    Critical care time (minutes):  37   Critical care was necessary to treat or prevent imminent or life-threatening deterioration of the following conditions: Acute segmental pulmonary embolism.   Critical care was time spent personally by me on the following activities:  Discussions with consultants, evaluation of patient's response to treatment, examination of patient, ordering and performing treatments and interventions, ordering and review of laboratory studies, ordering and review of  radiographic studies, pulse oximetry, re-evaluation of patient's condition, obtaining history from patient or surrogate and review of old charts    ED Course and Medical Decision Making  I have reviewed the triage vital signs and the nursing notes.  Pertinent labs & imaging results that were available during my care of the patient were reviewed by me and considered in my medical decision making (see below for details).     Hemoptysis, favored to be due to recent pneumonia however patient arrives tachycardic, also considering pulmonary embolism.  Will screen with D-dimer.  12:21 AM update: D-dimer is markedly elevated, will pursue CTA imaging.  CT reveals segmental pulmonary embolism, no heart strain.  Likely early pulmonary infarct.  Will admit to medicine.  Elmer Sow. Pilar Plate, MD [facility] Emergency Medicine [facility] [email or web address]  Final Clinical Impressions(s) / ED Diagnoses     ICD-10-CM   1. Acute pulmonary embolism without acute cor pulmonale, unspecified pulmonary embolism type (HCC)  I26.99     ED Discharge Orders    None       Discharge Instructions Discussed with and Provided to Patient:   Discharge Instructions   None       Sabas Sous, MD 10/16/19 [phone]

## 2019-10-15 NOTE — ED Triage Notes (Signed)
Patient states that he is spitting up blood clots. Patient states it is more from congestion and this started today. Patient states that he has spit up 3 to 4 clots. Patient was diagnosed with pneumonia a week ago but not tested for COVID.

## 2019-10-15 NOTE — ED Notes (Signed)
ED Provider at bedside. 

## 2019-10-16 ENCOUNTER — Inpatient Hospital Stay (HOSPITAL_COMMUNITY): Payer: Medicare Other

## 2019-10-16 ENCOUNTER — Emergency Department (HOSPITAL_COMMUNITY): Payer: Medicare Other

## 2019-10-16 DIAGNOSIS — I2699 Other pulmonary embolism without acute cor pulmonale: Secondary | ICD-10-CM | POA: Diagnosis present

## 2019-10-16 DIAGNOSIS — Z8701 Personal history of pneumonia (recurrent): Secondary | ICD-10-CM | POA: Diagnosis not present

## 2019-10-16 DIAGNOSIS — R042 Hemoptysis: Secondary | ICD-10-CM | POA: Diagnosis not present

## 2019-10-16 DIAGNOSIS — D696 Thrombocytopenia, unspecified: Secondary | ICD-10-CM | POA: Diagnosis present

## 2019-10-16 DIAGNOSIS — Z79899 Other long term (current) drug therapy: Secondary | ICD-10-CM | POA: Diagnosis not present

## 2019-10-16 DIAGNOSIS — Z20828 Contact with and (suspected) exposure to other viral communicable diseases: Secondary | ICD-10-CM | POA: Diagnosis present

## 2019-10-16 DIAGNOSIS — Z880 Allergy status to penicillin: Secondary | ICD-10-CM | POA: Diagnosis not present

## 2019-10-16 DIAGNOSIS — F17200 Nicotine dependence, unspecified, uncomplicated: Secondary | ICD-10-CM | POA: Diagnosis present

## 2019-10-16 DIAGNOSIS — I2693 Single subsegmental pulmonary embolism without acute cor pulmonale: Secondary | ICD-10-CM

## 2019-10-16 LAB — BASIC METABOLIC PANEL
Anion gap: 9 (ref 5–15)
BUN: 15 mg/dL (ref 8–23)
CO2: 26 mmol/L (ref 22–32)
Calcium: 9.2 mg/dL (ref 8.9–10.3)
Chloride: 104 mmol/L (ref 98–111)
Creatinine, Ser: 1.23 mg/dL (ref 0.61–1.24)
GFR calc Af Amer: >60 mL/min (ref >60)
GFR calc non Af Amer: 58 mL/min — ABNORMAL LOW (ref >60)
Glucose, Bld: 106 mg/dL — ABNORMAL HIGH (ref 70–99)
Potassium: 3.6 mmol/L (ref 3.5–5.1)
Sodium: 139 mmol/L (ref 135–145)

## 2019-10-16 LAB — CBC
HCT: 36.7 % — ABNORMAL LOW (ref 39.0–52.0)
Hemoglobin: 12.2 g/dL — ABNORMAL LOW (ref 13.0–17.0)
MCH: 35.6 pg — ABNORMAL HIGH (ref 26.0–34.0)
MCHC: 33.2 g/dL (ref 30.0–36.0)
MCV: 107 fL — ABNORMAL HIGH (ref 80.0–100.0)
Platelets: 81 10*3/uL — ABNORMAL LOW (ref 150–400)
RBC: 3.43 MIL/uL — ABNORMAL LOW (ref 4.22–5.81)
RDW: 14.8 % (ref 11.5–15.5)
WBC: 8.4 10*3/uL (ref 4.0–10.5)
nRBC: 0 % (ref 0.0–0.2)

## 2019-10-16 LAB — SARS CORONAVIRUS 2 (TAT 6-24 HRS): SARS Coronavirus 2: NEGATIVE

## 2019-10-16 LAB — D-DIMER, QUANTITATIVE: D-Dimer, Quant: 2.7 ug/mL-FEU — ABNORMAL HIGH (ref 0.00–0.50)

## 2019-10-16 LAB — ECHOCARDIOGRAM COMPLETE
Height: 70 in
Weight: 2320 oz

## 2019-10-16 MED ORDER — APIXABAN 5 MG PO TABS
10.0000 mg | ORAL_TABLET | Freq: Two times a day (BID) | ORAL | Status: DC
  Administered 2019-10-16 – 2019-10-17 (×3): 10 mg via ORAL
  Filled 2019-10-16 (×3): qty 2

## 2019-10-16 MED ORDER — ACETAMINOPHEN 650 MG RE SUPP: 650.0000 mg | Freq: Four times a day (QID) | RECTAL | Status: DC | PRN

## 2019-10-16 MED ORDER — ONDANSETRON HCL 4 MG/2ML IJ SOLN: 4.0000 mg | Freq: Four times a day (QID) | INTRAMUSCULAR | Status: DC | PRN

## 2019-10-16 MED ORDER — HEPARIN (PORCINE) 25000 UT/250ML-% IV SOLN
1000.0000 [IU]/h | INTRAVENOUS | Status: DC
  Administered 2019-10-16: 1000 [IU]/h via INTRAVENOUS
  Filled 2019-10-16: qty 250

## 2019-10-16 MED ORDER — IOHEXOL 350 MG/ML SOLN
100.0000 mL | Freq: Once | INTRAVENOUS | Status: AC | PRN
  Administered 2019-10-16: 100 mL via INTRAVENOUS

## 2019-10-16 MED ORDER — ACETAMINOPHEN 325 MG PO TABS: 650.0000 mg | ORAL_TABLET | Freq: Four times a day (QID) | ORAL | Status: DC | PRN

## 2019-10-16 MED ORDER — ONDANSETRON HCL 4 MG PO TABS
4.0000 mg | ORAL_TABLET | Freq: Four times a day (QID) | ORAL | Status: DC | PRN
  Administered 2019-10-16: 4 mg via ORAL
  Filled 2019-10-16: qty 1

## 2019-10-16 MED ORDER — BUTALBITAL-APAP-CAFFEINE 50-325-40 MG PO TABS
1.0000 | ORAL_TABLET | Freq: Two times a day (BID) | ORAL | Status: DC | PRN
  Administered 2019-10-16: 1 via ORAL
  Filled 2019-10-16: qty 1

## 2019-10-16 MED ORDER — CYCLOBENZAPRINE HCL 10 MG PO TABS
10.0000 mg | ORAL_TABLET | Freq: Three times a day (TID) | ORAL | Status: DC | PRN
  Administered 2019-10-16 – 2019-10-17 (×3): 10 mg via ORAL
  Filled 2019-10-16 (×3): qty 1

## 2019-10-16 MED ORDER — HEPARIN BOLUS VIA INFUSION
4000.0000 [IU] | Freq: Once | INTRAVENOUS | Status: AC
  Administered 2019-10-16: 4000 [IU] via INTRAVENOUS

## 2019-10-16 MED ORDER — HYDROCODONE-ACETAMINOPHEN 5-325 MG PO TABS
1.0000 | ORAL_TABLET | Freq: Four times a day (QID) | ORAL | Status: DC | PRN
  Administered 2019-10-16 – 2019-10-17 (×3): 1 via ORAL
  Filled 2019-10-16 (×3): qty 1

## 2019-10-16 MED ORDER — APIXABAN 5 MG PO TABS: 5.0000 mg | ORAL_TABLET | Freq: Two times a day (BID) | ORAL | Status: DC

## 2019-10-16 MED ORDER — CYANOCOBALAMIN 1000 MCG/ML IJ SOLN: 1000.0000 ug | INTRAMUSCULAR | Status: DC

## 2019-10-16 NOTE — Progress Notes (Signed)
ASSUMPTION OF CARE NOTE   10/16/2019 2:40 PM  Brian Stark was seen and examined.  The H&P by the admitting provider, orders, imaging was reviewed.  Please see new orders.  Will continue to follow.   Apixaban per pharmacy consult requested.    Vitals:   10/16/19 1200 10/16/19 1230  BP: 131/83 135/86  Pulse: 81 88  Resp: 19 15  Temp:    SpO2: 95% 95%    Results for orders placed or performed during the hospital encounter of 10/15/19  CBC with Differential  Result Value Ref Range   WBC 11.6 (H) 4.0 - 10.5 K/uL   RBC 3.64 (L) 4.22 - 5.81 MIL/uL   Hemoglobin 13.0 13.0 - 17.0 g/dL   HCT 55.7 32.2 - 02.5 %   MCV 108.0 (H) 80.0 - 100.0 fL   MCH 35.7 (H) 26.0 - 34.0 pg   MCHC 33.1 30.0 - 36.0 g/dL   RDW 42.7 06.2 - 37.6 %   Platelets 80 (L) 150 - 400 K/uL   nRBC 0.0 0.0 - 0.2 %   Neutrophils Relative % 83 %   Neutro Abs 9.6 (H) 1.7 - 7.7 K/uL   Lymphocytes Relative 4 %   Lymphs Abs 0.4 (L) 0.7 - 4.0 K/uL   Monocytes Relative 12 %   Monocytes Absolute 1.4 (H) 0.1 - 1.0 K/uL   Eosinophils Relative 0 %   Eosinophils Absolute 0.0 0.0 - 0.5 K/uL   Basophils Relative 0 %   Basophils Absolute 0.0 0.0 - 0.1 K/uL   Immature Granulocytes 1 %   Abs Immature Granulocytes 0.14 (H) 0.00 - 0.07 K/uL  Basic metabolic panel  Result Value Ref Range   Sodium 133 (L) 135 - 145 mmol/L   Potassium 3.5 3.5 - 5.1 mmol/L   Chloride 100 98 - 111 mmol/L   CO2 24 22 - 32 mmol/L   Glucose, Bld 113 (H) 70 - 99 mg/dL   BUN 17 8 - 23 mg/dL   Creatinine, Ser 2.83 (H) 0.61 - 1.24 mg/dL   Calcium 9.5 8.9 - 15.1 mg/dL   GFR calc non Af Amer 53 (L) >60 mL/min   GFR calc Af Amer >60 >60 mL/min   Anion gap 9 5 - 15  D-dimer, quantitative (not at [facility])  Result Value Ref Range   D-Dimer, Quant 2.70 (H) 0.00 - 0.50 ug/mL-FEU  CBC  Result Value Ref Range   WBC 8.4 4.0 - 10.5 K/uL   RBC 3.43 (L) 4.22 - 5.81 MIL/uL   Hemoglobin 12.2 (L) 13.0 - 17.0 g/dL   HCT 76.1 (L) 60.7 - 37.1 %   MCV 107.0 (H) 80.0  - 100.0 fL   MCH 35.6 (H) 26.0 - 34.0 pg   MCHC 33.2 30.0 - 36.0 g/dL   RDW 06.2 69.4 - 85.4 %   Platelets 81 (L) 150 - 400 K/uL   nRBC 0.0 0.0 - 0.2 %  Basic metabolic panel  Result Value Ref Range   Sodium 139 135 - 145 mmol/L   Potassium 3.6 3.5 - 5.1 mmol/L   Chloride 104 98 - 111 mmol/L   CO2 26 22 - 32 mmol/L   Glucose, Bld 106 (H) 70 - 99 mg/dL   BUN 15 8 - 23 mg/dL   Creatinine, Ser 6.27 0.61 - 1.24 mg/dL   Calcium 9.2 8.9 - 03.5 mg/dL   GFR calc non Af Amer 58 (L) >60 mL/min   GFR calc Af Amer >60 >60 mL/min   Anion  gap 9 5 - 15  ECHOCARDIOGRAM COMPLETE  Result Value Ref Range   Weight 2,320 oz   Height 70 in   BP 151/89 mmHg   Time spent: 30 minutes   Murvin Natal, MD [facility]   10/15/2019 11:28 PM How to contact the [facility] Attending or Consulting provider [place] or covering provider during after hours [place], for this patient?  1. Check the care team in [facility] and look for a) attending/consulting TR[facility] provider listed and b) the [facility] team listed 2. Log into [email or web address] and use [place]'s universal password to access. If you do not have the password, please contact the hospital operator. 3. Locate the [facility] provider you are looking for under [facility] and page to a number that you can be directly reached. 4. If you still have difficulty reaching the provider, please page the [facility] (Director on Call) for the Hospitalists listed on amion for assistance.

## 2019-10-16 NOTE — ED Notes (Signed)
Waiting for pharmacy to deliver B12 shot

## 2019-10-16 NOTE — Progress Notes (Signed)
ANTICOAGULATION CONSULT NOTE - Initial Consult  Pharmacy Consult for Heparin Indication: pulmonary embolus  Allergies  Allergen Reactions  . Flucloxacillin Anaphylaxis    Patient Measurements: Height: 5\' 10"  (177.8 cm) Weight: 145 lb (65.8 kg) IBW/kg (Calculated) : 73  Vital Signs: Temp: 98.7 F (37.1 C) (12/12 1942) Temp Source: Oral (12/12 1942) BP: 152/86 (12/12 1942) Pulse Rate: 107 (12/12 2342)  Labs: Recent Labs    10/15/19 2019  HGB 13.0  HCT 39.3  PLT 80*  CREATININE 1.33*    Estimated Creatinine Clearance: 46 mL/min (A) (by C-G formula based on SCr of 1.33 mg/dL (H)).   Medical History: Past Medical History:  Diagnosis Date  . Bursitis    l shoulder  . Migraines   . Rheumatic fever   . Tendonitis    L shoulder    Medications:  See electronic med rec  Assessment: 73 y/o M presents with hemoptysis. CT scan shows b/l PE with no evidence of right heart strain. To begin heparin per pharmacy. No AC PTA. H/H ok at baseline, plt 80 (noted his baseline 80-90).  Goal of Therapy:  Heparin level 0.3-0.7 units/ml Monitor platelets by anticoagulation protocol: Yes   Plan:  Heparin IV bolus 4000 units Heparin gtt at 1000 units/hr Will f/u heparin level in 8 hours Daily heparin level and CBC  Sherlon Handing, PharmD, BCPS Please see amion for complete clinical pharmacist phone list 10/16/2019,2:22 AM

## 2019-10-16 NOTE — Progress Notes (Signed)
*  PRELIMINARY RESULTS* Echocardiogram 2D Echocardiogram has been performed.  Leavy Cella 10/16/2019, 12:32 PM

## 2019-10-16 NOTE — H&P (Addendum)
TRH H&P    Patient Demographics:    Brian Stark, is a 73 y.o. male  MRN: [id number]  DOB - 1945-11-20  Admit Date - 10/15/2019  Referring MD/NP/PA: Gerlene Fee  Outpatient Primary MD for the patient is Celene Squibb, MD  Patient coming from: Home  Chief complaint-shortness of breath   HPI:    Brian Stark  is a 73 y.o. male, with history of migraines, bilateral craniotomies for subdural hematoma evacuation came to hospital with chief complaint of hemoptysis.  Patient says that he was prescribed methylprednisolone and Levaquin by his PCP for pneumonia and he has been taking antibiotic for past 1 week.  Today he noticed that he had 3 episodes of hemoptysis which started around 4 PM. Patient came to the ED for further evaluation. CT chest showed acute bilateral pulmonary emboli greatest within the left lower lobe. Patient started on IV heparin. He denies nausea vomiting or diarrhea. Complains of chest pain on left side     Review of systems:    In addition to the HPI above,    All other systems reviewed and are negative.    Past History of the following :    Past Medical History:  Diagnosis Date  . Bursitis    l shoulder  . Migraines   . Rheumatic fever   . Tendonitis    L shoulder      Past Surgical History:  Procedure Laterality Date  . ABDOMINAL SURGERY    . CATARACT EXTRACTION, BILATERAL    . CHOLECYSTECTOMY    . CRANIOTOMY Bilateral 09/06/2018   Procedure: BILATERAL CRANIOTOMIES FOR SUBDURAL HEMATOMA EVACUATION;  Surgeon: Erline Levine, MD;  Location: [facility];  Service: Neurosurgery;  Laterality: Bilateral;  . EYE SURGERY        Social History:      Social History   Tobacco Use  . Smoking status: Current Every Day Smoker  . Smokeless tobacco: Never Used  Substance Use Topics  . Alcohol use: Yes    Comment: daily-beer 2 cans       Family History :   Patient's both  parents had A. fib   Home Medications:   Prior to Admission medications   Medication Sig Start Date End Date Taking? Authorizing Provider  butalbital-acetaminophen-caffeine (FIORICET, ESGIC) 50-325-40 MG tablet Take 1 tablet by mouth 2 (two) times daily as needed for headache.    [provider]  cyanocobalamin (,VITAMIN B-12,) 1000 MCG/ML injection Inject 1,000 mcg into the muscle every 30 (thirty) days.    [provider]  Homeopathic Products (LEG CRAMP RELIEF PO) Take 1 tablet by mouth daily as needed (Leg cramp).    [provider]  Polyethyl Glycol-Propyl Glycol 0.4-0.3 % SOLN Apply 1 drop to eye.    [provider]  rizatriptan (MAXALT) 10 MG tablet Take 10 mg by mouth as needed for migraine. May repeat in 2 hours if needed    [provider]  tadalafil (CIALIS) 20 MG tablet Take 20 mg by mouth daily as needed for erectile dysfunction.  [provider]     Allergies:     Allergies  Allergen Reactions  . Flucloxacillin Anaphylaxis     Physical Exam:   Vitals  Blood pressure (!) 138/97, pulse (!) 107, temperature 98.7 F (37.1 C), temperature source Oral, resp. rate 20, height 5\' 10"  (1.778 m), weight 65.8 kg, SpO2 94 %.  1.  General: Appears in no acute distress  2. Psychiatric: Alert, oriented x3, intact insight and judgment  3. Neurologic: Cranial nerves II through XII grossly intact, no focal deficit noted  4. HEENMT:  Atraumatic normocephalic, extraocular muscles are intact  5. Respiratory : Bibasilar crackles auscultated  6. Cardiovascular : S1-S2, regular, no murmur auscultated  7. Gastrointestinal:  Abdomen is soft, nontender, no organomegaly  Extremities-negative ' sign, no edema noted in lower extremities    Data Review:    CBC Recent Labs  Lab 10/15/19 2019 10/16/19 0443  WBC 11.6* 8.4  HGB 13.0 12.2*  HCT 39.3 36.7*  PLT 80* 81*  MCV 108.0* 107.0*  MCH 35.7* 35.6*  MCHC  33.1 33.2  RDW 14.6 14.8  LYMPHSABS 0.4*  --   MONOABS 1.4*  --   EOSABS 0.0  --   BASOSABS 0.0  --    ------------------------------------------------------------------------------------------------------------------  Results for orders placed or performed during the hospital encounter of 10/15/19 (from the past 48 hour(s))  CBC with Differential     Status: Abnormal   Collection Time: 10/15/19  8:19 PM  Result Value Ref Range   WBC 11.6 (H) 4.0 - 10.5 K/uL   RBC 3.64 (L) 4.22 - 5.81 MIL/uL   Hemoglobin 13.0 13.0 - 17.0 g/dL   HCT 14/12/20 04.8 - 88.9 %   MCV 108.0 (H) 80.0 - 100.0 fL   MCH 35.7 (H) 26.0 - 34.0 pg   MCHC 33.1 30.0 - 36.0 g/dL   RDW 16.9 45.0 - 38.8 %   Platelets 80 (L) 150 - 400 K/uL    Comment: SPECIMEN CHECKED FOR CLOTS CONSISTENT WITH PREVIOUS RESULT    nRBC 0.0 0.0 - 0.2 %   Neutrophils Relative % 83 %   Neutro Abs 9.6 (H) 1.7 - 7.7 K/uL   Lymphocytes Relative 4 %   Lymphs Abs 0.4 (L) 0.7 - 4.0 K/uL   Monocytes Relative 12 %   Monocytes Absolute 1.4 (H) 0.1 - 1.0 K/uL   Eosinophils Relative 0 %   Eosinophils Absolute 0.0 0.0 - 0.5 K/uL   Basophils Relative 0 %   Basophils Absolute 0.0 0.0 - 0.1 K/uL   Immature Granulocytes 1 %   Abs Immature Granulocytes 0.14 (H) 0.00 - 0.07 K/uL    Comment: Performed at [facility], [address] [place], [place]  Basic metabolic panel     Status: Abnormal   Collection Time: 10/15/19  8:19 PM  Result Value Ref Range   Sodium 133 (L) 135 - 145 mmol/L   Potassium 3.5 3.5 - 5.1 mmol/L   Chloride 100 98 - 111 mmol/L   CO2 24 22 - 32 mmol/L   Glucose, Bld 113 (H) 70 - 99 mg/dL   BUN 17 8 - 23 mg/dL   Creatinine, Ser 14/12/20 (H) 0.61 - 1.24 mg/dL   Calcium 9.5 8.9 - 1.79 mg/dL   GFR calc non Af Amer 53 (L) >60 mL/min   GFR calc Af Amer >60 >60 mL/min   Anion gap 9 5 - 15    Comment: Performed at [facility], [address] [place], [place]  D-dimer, quantitative (not at [facility])     Status: Abnormal     Collection Time: 10/15/19  8:19 PM  Result Value Ref Range   D-Dimer, Quant 2.70 (H) 0.00 - 0.50 ug/mL-FEU    Comment: (NOTE) At the manufacturer cut-off of 0.50 ug/mL FEU, this assay has been documented to exclude PE with a sensitivity and negative predictive value of 97 to 99%.  At this time, this assay has not been approved by the FDA to exclude DVT/VTE. Results should be correlated with clinical presentation. Performed at [facility], [address] [place], [place] [address]   CBC     Status: Abnormal   Collection Time: 10/16/19  4:43 AM  Result Value Ref Range   WBC 8.4 4.0 - 10.5 K/uL   RBC 3.43 (L) 4.22 - 5.81 MIL/uL   Hemoglobin 12.2 (L) 13.0 - 17.0 g/dL   HCT 62.1 (L) 30.8 - 65.7 %   MCV 107.0 (H) 80.0 - 100.0 fL   MCH 35.6 (H) 26.0 - 34.0 pg   MCHC 33.2 30.0 - 36.0 g/dL   RDW 84.6 96.2 - 95.2 %   Platelets 81 (L) 150 - 400 K/uL    Comment: SPECIMEN CHECKED FOR CLOTS CONSISTENT WITH PREVIOUS RESULT    nRBC 0.0 0.0 - 0.2 %    Comment: Performed at [facility], [address], [place], [place] [address]    Chemistries  Recent Labs  Lab 10/15/19 2019  NA 133*  K 3.5  CL 100  CO2 24  GLUCOSE 113*  BUN 17  CREATININE 1.33*  CALCIUM 9.5   ------------------------------------------------------------------------------------------------------------------  ------------------------------------------------------------------------------------------------------------------ GFR: Estimated Creatinine Clearance: 46 mL/min (A) (by C-G formula based on SCr of 1.33 mg/dL (H)). Liver Function Tests: No results for input(s): AST, ALT, ALKPHOS, BILITOT, PROT, ALBUMIN in the last 168 hours. No results for input(s): LIPASE, AMYLASE in the last 168 hours. No results for input(s): AMMONIA in the last 168 hours. Coagulation Profile: No results for input(s): INR, PROTIME in the last 168 hours. Cardiac Enzymes: No results for input(s): CKTOTAL, CKMB, CKMBINDEX, TROPONINI in the  last 168 hours. BNP (last 3 results) No results for input(s): PROBNP in the last 8760 hours. HbA1C: No results for input(s): HGBA1C in the last 72 hours. CBG: No results for input(s): GLUCAP in the last 168 hours. Lipid Profile: No results for input(s): CHOL, HDL, LDLCALC, TRIG, CHOLHDL, LDLDIRECT in the last 72 hours. Thyroid Function Tests: No results for input(s): TSH, T4TOTAL, FREET4, T3FREE, THYROIDAB in the last 72 hours. Anemia Panel: No results for input(s): VITAMINB12, FOLATE, FERRITIN, TIBC, IRON, RETICCTPCT in the last 72 hours.  --------------------------------------------------------------------------------------------------------------- Urine analysis:    Component Value Date/Time   COLORURINE YELLOW 09/05/2018 2312   APPEARANCEUR CLEAR 09/05/2018 2312   LABSPEC <1.005 (L) 09/05/2018 2312   PHURINE 6.0 09/05/2018 2312   GLUCOSEU NEGATIVE 09/05/2018 2312   HGBUR TRACE (A) 09/05/2018 2312   BILIRUBINUR NEGATIVE 09/05/2018 2312   KETONESUR NEGATIVE 09/05/2018 2312   PROTEINUR NEGATIVE 09/05/2018 2312   NITRITE NEGATIVE 09/05/2018 2312   LEUKOCYTESUR NEGATIVE 09/05/2018 2312      Imaging Results:    CTA Chest for PE  Result Date: 10/16/2019 CLINICAL DATA:  Congestion and shortness of breath. Hemoptysis EXAM: CT ANGIOGRAPHY CHEST WITH CONTRAST TECHNIQUE: Multidetector CT imaging of the chest was performed using the standard protocol during bolus administration of intravenous contrast. Multiplanar CT image reconstructions and MIPs were obtained to evaluate the vascular anatomy. CONTRAST:  OMNIPAQUE IOHEXOL 350 MG/ML  SOLN COMPARISON:  None. FINDINGS: Cardiovascular: Contrast injection is sufficient to demonstrate satisfactory opacification of the pulmonary arteries to the segmental level.There are acute bilateral pulmonary emboli, greatest within the left lower lobe where there are lobar, segmental, and subsegmental pulmonary emboli. There are segmental pulmonary  emboli involving the right lower lobe and left upper lobe. The main pulmonary artery is within normal limits for size. There is no CT evidence of acute right heart strain. There are atherosclerotic changes of the visualized thoracic aorta without evidence for dissection. Heart size is normal, without pericardial effusion. Mediastinum/Nodes: --No mediastinal or hilar lymphadenopathy. --No axillary lymphadenopathy. --No supraclavicular lymphadenopathy. --Normal thyroid gland. --the esophagus is fluid-filled to the level of the upper thorax. Lungs/Pleura: There is no pneumothorax. The trachea is unremarkable. There are hazy airspace opacities at the left lung base which may represent developing pulmonary infarcts in the setting of acute pulmonary emboli. There is no significant pleural effusion. Upper Abdomen: The patient is status post cholecystectomy. There is significant intrahepatic biliary ductal dilatation involving the left hepatic lobe. Musculoskeletal: No chest wall abnormality. No acute or significant osseous findings. Review of the MIP images confirms the above findings. IMPRESSION: 1. Acute bilateral pulmonary emboli as detailed above, greatest within the left lower lobe. There is no CT evidence for right heart strain. 2. Hazy airspace opacities at the left lung base which may represent developing pulmonary infarcts in the setting of acute pulmonary emboli. However, a developing infectious process cannot be excluded. 3. The esophagus is fluid-filled to the level of the upper thorax. This places the patient at risk for aspiration. Aortic Atherosclerosis (ICD10-I70.0). These results were called by telephone at the time of interpretation on 10/16/2019 at 2:10 am to provider [facility] , who verbally acknowledged these results. Electronically Signed   By: Katherine Mantlehristopher  Green M.D.   On: 10/16/2019 02:14    My personal review of EKG: Rhythm NSR, inverted T waves in lead III   Assessment & Plan:    Active  Problems:   PE (pulmonary thromboembolism) (HCC)   1. Pulmonary embolism-we will start patient on heparin protocol.  He denies prolonged bedrest, no recent surgery, no long trips.  Patient lost 4 pounds of weight over past 3 months.  No significant weight loss.  Will start on heparin per pharmacy consultation.  Patient is to be discharged on oral anticoagulation therapy with Eliquis or Xarelto once he is medically stable.  Will obtain echocardiogram.  2. Thrombocytopenia-chronic, platelet count is 81,000. ?  MDS,  started on heparin.  Will closely monitor patient's platelet count.  No active bleeding at this time.  Will need hematology evaluation as outpatient.    DVT Prophylaxis-   Heparin  AM Labs Ordered, also please review Full Orders  Family Communication: Admission, patients condition and plan of care including tests being ordered have been discussed with the patient  who indicate understanding and agree with the plan and Code Status.  Code Status: Full code  Admission status: Inpatient: Based on patients clinical presentation and evaluation of above clinical data, I have made determination that patient meets Inpatient criteria at this time.  Time spent in minutes : 60 minutes   Karista Aispuro S Buford Gayler M.D

## 2019-10-16 NOTE — Progress Notes (Signed)
ANTICOAGULATION CONSULT NOTE -   Pharmacy Consult for Heparin--> eliquis Indication: pulmonary embolus  Allergies  Allergen Reactions  . Flucloxacillin Anaphylaxis    Patient Measurements: Height: 5\' 10"  (177.8 cm) Weight: 145 lb (65.8 kg) IBW/kg (Calculated) : 73  Vital Signs: BP: 128/80 (12/13 0830) Pulse Rate: 85 (12/13 0830)  Labs: Recent Labs    10/15/19 2019 10/16/19 0443  HGB 13.0 12.2*  HCT 39.3 36.7*  PLT 80* 81*  CREATININE 1.33*  --     Estimated Creatinine Clearance: 46 mL/min (A) (by C-G formula based on SCr of 1.33 mg/dL (H)).   Medical History: Past Medical History:  Diagnosis Date  . Bursitis    l shoulder  . Migraines   . Rheumatic fever   . Tendonitis    L shoulder    Medications:  See electronic med rec  Assessment: 73 y/o M presents with hemoptysis. CT scan shows b/l PE with no evidence of right heart strain. To begin heparin per pharmacy. No AC PTA. H/H ok at baseline, plt 80 (noted his baseline 80-90). Transition to po DOAC for treatment of PE.  Will need to monitor closely to ensure no bleeding  Goal of Therapy:  Heparin level 0.3-0.7 units/ml Monitor platelets by anticoagulation protocol: Yes   Plan:  D/C heparin Eliquis 10mg  po bid x 7 days, then 5mg  po bid Monitor for S/S of bleeding Educate on eliquis  Isac Sarna, BS Pharm D, BCPS Clinical Pharmacist Pager [phone] 10/16/2019,9:17 AM

## 2019-10-16 NOTE — ED Notes (Signed)
ED TO INPATIENT HANDOFF REPORT  ED Nurse Name and Phone #: [phone]  S Name/Age/Gender Brian Stark 73 y.o. male Room/Bed: APA03/APA03  Code Status   Code Status: Full Code  Home/SNF/Other Home Patient oriented to: self, place, time and situation Is this baseline? Yes   Triage Complete: Triage complete  Chief Complaint PE (pulmonary thromboembolism) (HCC) [I26.99]  Triage Note Patient states that he is spitting up blood clots. Patient states it is more from congestion and this started today. Patient states that he has spit up 3 to 4 clots. Patient was diagnosed with pneumonia a week ago but not tested for COVID.    Allergies Allergies  Allergen Reactions  . Flucloxacillin Anaphylaxis    Level of Care/Admitting Diagnosis ED Disposition    ED Disposition Condition Comment   Admit  Hospital Area: [facility] [[id number]]  Level of Care: Telemetry [5]  Covid Evaluation: Asymptomatic Screening Protocol (No Symptoms)  Diagnosis: PE (pulmonary thromboembolism) (HCC) [644034]  Admitting Physician: Lovie Chol  Attending Physician: Meredeth Ide [[id number]]  Estimated length of stay: 3 - 4 days  Certification:: I certify this patient will need inpatient services for at least 2 midnights       B Medical/Surgery History Past Medical History:  Diagnosis Date  . Bursitis    l shoulder  . Migraines   . Rheumatic fever   . Tendonitis    L shoulder   Past Surgical History:  Procedure Laterality Date  . ABDOMINAL SURGERY    . CATARACT EXTRACTION, BILATERAL    . CHOLECYSTECTOMY    . CRANIOTOMY Bilateral 09/06/2018   Procedure: BILATERAL CRANIOTOMIES FOR SUBDURAL HEMATOMA EVACUATION;  Surgeon: Maeola Harman, MD;  Location: [facility] OR;  Service: Neurosurgery;  Laterality: Bilateral;  . EYE SURGERY       A IV Location/Drains/Wounds Patient Lines/Drains/Airways Status   Active Line/Drains/Airways    Name:   Placement date:   Placement time:   Site:   Days:    Peripheral IV 10/16/19 Right Forearm   10/16/19    0135    Forearm   less than 1   Incision (Closed) 09/06/18 Head Left   09/06/18    0338     405   Incision (Closed) 09/09/18 Head Right   09/09/18    0800     402          Intake/Output Last 24 hours  Intake/Output Summary (Last 24 hours) at 10/16/2019 1511 Last data filed at 10/16/2019 0946 Gross per 24 hour  Intake 105.67 ml  Output 700 ml  Net -594.33 ml    Labs/Imaging Results for orders placed or performed during the hospital encounter of 10/15/19 (from the past 48 hour(s))  CBC with Differential     Status: Abnormal   Collection Time: 10/15/19  8:19 PM  Result Value Ref Range   WBC 11.6 (H) 4.0 - 10.5 K/uL   RBC 3.64 (L) 4.22 - 5.81 MIL/uL   Hemoglobin 13.0 13.0 - 17.0 g/dL   HCT 74.2 59.5 - 63.8 %   MCV 108.0 (H) 80.0 - 100.0 fL   MCH 35.7 (H) 26.0 - 34.0 pg   MCHC 33.1 30.0 - 36.0 g/dL   RDW 75.6 43.3 - 29.5 %   Platelets 80 (L) 150 - 400 K/uL    Comment: SPECIMEN CHECKED FOR CLOTS CONSISTENT WITH PREVIOUS RESULT    nRBC 0.0 0.0 - 0.2 %   Neutrophils Relative % 83 %   Neutro Abs 9.6 (  H) 1.7 - 7.7 K/uL   Lymphocytes Relative 4 %   Lymphs Abs 0.4 (L) 0.7 - 4.0 K/uL   Monocytes Relative 12 %   Monocytes Absolute 1.4 (H) 0.1 - 1.0 K/uL   Eosinophils Relative 0 %   Eosinophils Absolute 0.0 0.0 - 0.5 K/uL   Basophils Relative 0 %   Basophils Absolute 0.0 0.0 - 0.1 K/uL   Immature Granulocytes 1 %   Abs Immature Granulocytes 0.14 (H) 0.00 - 0.07 K/uL    Comment: Performed at [facility], [address] [place], [place] [address]  Basic metabolic panel     Status: Abnormal   Collection Time: 10/15/19  8:19 PM  Result Value Ref Range   Sodium 133 (L) 135 - 145 mmol/L   Potassium 3.5 3.5 - 5.1 mmol/L   Chloride 100 98 - 111 mmol/L   CO2 24 22 - 32 mmol/L   Glucose, Bld 113 (H) 70 - 99 mg/dL   BUN 17 8 - 23 mg/dL   Creatinine, Ser 6.041.33 (H) 0.61 - 1.24 mg/dL   Calcium 9.5 8.9 - 54.010.3 mg/dL   GFR calc non Af  Amer 53 (L) >60 mL/min   GFR calc Af Amer >60 >60 mL/min   Anion gap 9 5 - 15    Comment: Performed at [facility], [address] [place], [place] [address]  D-dimer, quantitative (not at [facility])     Status: Abnormal   Collection Time: 10/15/19  8:19 PM  Result Value Ref Range   D-Dimer, Quant 2.70 (H) 0.00 - 0.50 ug/mL-FEU    Comment: (NOTE) At the manufacturer cut-off of 0.50 ug/mL FEU, this assay has been documented to exclude PE with a sensitivity and negative predictive value of 97 to 99%.  At this time, this assay has not been approved by the FDA to exclude DVT/VTE. Results should be correlated with clinical presentation. Performed at [facility], [address] [place], [place] [address]   CBC     Status: Abnormal   Collection Time: 10/16/19  4:43 AM  Result Value Ref Range   WBC 8.4 4.0 - 10.5 K/uL   RBC 3.43 (L) 4.22 - 5.81 MIL/uL   Hemoglobin 12.2 (L) 13.0 - 17.0 g/dL   HCT 95.636.7 (L) 21.339.0 - 08.652.0 %   MCV 107.0 (H) 80.0 - 100.0 fL   MCH 35.6 (H) 26.0 - 34.0 pg   MCHC 33.2 30.0 - 36.0 g/dL   RDW 57.814.8 46.911.5 - 62.915.5 %   Platelets 81 (L) 150 - 400 K/uL    Comment: SPECIMEN CHECKED FOR CLOTS CONSISTENT WITH PREVIOUS RESULT    nRBC 0.0 0.0 - 0.2 %    Comment: Performed at [facility], [address] [place], [place] [address]  Basic metabolic panel     Status: Abnormal   Collection Time: 10/16/19  7:21 AM  Result Value Ref Range   Sodium 139 135 - 145 mmol/L   Potassium 3.6 3.5 - 5.1 mmol/L   Chloride 104 98 - 111 mmol/L   CO2 26 22 - 32 mmol/L   Glucose, Bld 106 (H) 70 - 99 mg/dL   BUN 15 8 - 23 mg/dL   Creatinine, Ser 3.241.23 0.61 - 1.24 mg/dL   Calcium 9.2 8.9 - 40.110.3 mg/dL   GFR calc non Af Amer 58 (L) >60 mL/min   GFR calc Af Amer >60 >60 mL/min   Anion gap 9 5 - 15    Comment: Performed at [facility], [address] [place], [place] [address]   CTA Chest for PE  Result Date: 10/16/2019 CLINICAL DATA:  Congestion and shortness of breath. Hemoptysis EXAM: CT  ANGIOGRAPHY CHEST WITH CONTRAST TECHNIQUE: Multidetector CT imaging of the chest was performed using the standard protocol during bolus administration of intravenous contrast. Multiplanar CT image reconstructions and MIPs were obtained to evaluate the vascular anatomy. CONTRAST:  OMNIPAQUE IOHEXOL 350 MG/ML SOLN COMPARISON:  None. FINDINGS: Cardiovascular: Contrast injection is sufficient to demonstrate satisfactory opacification of the pulmonary arteries to the segmental level.There are acute bilateral pulmonary emboli, greatest within the left lower lobe where there are lobar, segmental, and subsegmental pulmonary emboli. There are segmental pulmonary emboli involving the right lower lobe and left upper lobe. The main pulmonary artery is within normal limits for size. There is no CT evidence of acute right heart strain. There are atherosclerotic changes of the visualized thoracic aorta without evidence for dissection. Heart size is normal, without pericardial effusion. Mediastinum/Nodes: --No mediastinal or hilar lymphadenopathy. --No axillary lymphadenopathy. --No supraclavicular lymphadenopathy. --Normal thyroid gland. --the esophagus is fluid-filled to the level of the upper thorax. Lungs/Pleura: There is no pneumothorax. The trachea is unremarkable. There are hazy airspace opacities at the left lung base which may represent developing pulmonary infarcts in the setting of acute pulmonary emboli. There is no significant pleural effusion. Upper Abdomen: The patient is status post cholecystectomy. There is significant intrahepatic biliary ductal dilatation involving the left hepatic lobe. Musculoskeletal: No chest wall abnormality. No acute or significant osseous findings. Review of the MIP images confirms the above findings. IMPRESSION: 1. Acute bilateral pulmonary emboli as detailed above, greatest within the left lower lobe. There is no CT evidence for right heart strain. 2. Hazy airspace opacities at the  left lung base which may represent developing pulmonary infarcts in the setting of acute pulmonary emboli. However, a developing infectious process cannot be excluded. 3. The esophagus is fluid-filled to the level of the upper thorax. This places the patient at risk for aspiration. Aortic Atherosclerosis (ICD10-I70.0). These results were called by telephone at the time of interpretation on 10/16/2019 at 2:10 am to provider [facility] , who verbally acknowledged these results. Electronically Signed   By: Katherine Mantle M.D.   On: 10/16/2019 02:14   US Venous Img Lower Bilateral  Result Date: 10/16/2019 CLINICAL DATA:  Pulmonary embolus. EXAM: BILATERAL LOWER EXTREMITY VENOUS DOPPLER ULTRASOUND TECHNIQUE: Gray-scale sonography with graded compression, as well as color Doppler and duplex ultrasound were performed to evaluate the lower extremity deep venous systems from the level of the common femoral vein and including the common femoral, femoral, profunda femoral, popliteal and calf veins including the posterior tibial, peroneal and gastrocnemius veins when visible. The superficial great saphenous vein was also interrogated. Spectral Doppler was utilized to evaluate flow at rest and with distal augmentation maneuvers in the common femoral, femoral and popliteal veins. COMPARISON:  None. FINDINGS: RIGHT LOWER EXTREMITY Common Femoral Vein: No evidence of thrombus. Normal compressibility, respiratory phasicity and response to augmentation. Saphenofemoral Junction: No evidence of thrombus. Normal compressibility and flow on color Doppler imaging. Profunda Femoral Vein: No evidence of thrombus. Normal compressibility and flow on color Doppler imaging. Femoral Vein: No evidence of thrombus. Normal compressibility, respiratory phasicity and response to augmentation. Popliteal Vein: No evidence of thrombus. Normal compressibility, respiratory phasicity and response to augmentation. Calf Veins: No evidence of  thrombus. Normal compressibility and flow on color Doppler imaging. Superficial Great Saphenous Vein: No evidence of thrombus. Normal compressibility. Venous  Reflux:  None. Other Findings:  None. LEFT LOWER EXTREMITY Common Femoral Vein: No evidence of thrombus. Normal compressibility, respiratory phasicity and response to augmentation. Saphenofemoral Junction: No evidence of thrombus. Normal compressibility and flow on color Doppler imaging. Profunda Femoral Vein: No evidence of thrombus. Normal compressibility and flow on color Doppler imaging. Femoral Vein: No evidence of thrombus. Normal compressibility, respiratory phasicity and response to augmentation. Popliteal Vein: No evidence of thrombus. Normal compressibility, respiratory phasicity and response to augmentation. Calf Veins: No evidence of thrombus. Normal compressibility and flow on color Doppler imaging. Superficial Great Saphenous Vein: No evidence of thrombus. Normal compressibility. Venous Reflux:  None. Other Findings:  None. IMPRESSION: No evidence of deep venous thrombosis in either lower extremity. Electronically Signed   By: Marijo Conception M.D.   On: 10/16/2019 11:37   ECHOCARDIOGRAM COMPLETE  Result Date: 10/16/2019   ECHOCARDIOGRAM REPORT   Patient Name:   Brian Stark Date of Exam: 10/16/2019 Medical Rec #:  [id number]       Height:       70.0 in Accession #:    [id number]      Weight:       145.0 lb Date of Birth:  May 11, 1946       BSA:          1.82 m Patient Age:    55 years        BP:           151/89 mmHg Patient Gender: M               HR:           110 bpm. Exam Location:  [facility] Procedure: 2D Echo Indications:    Pulmonary Embolus 415.19 / I26.99  History:        Patient has no prior history of Echocardiogram examinations.                 Risk Factors:Current Smoker. PE (pulmonary thromboembolism,                 Subdural hematoma ,Subdural bleeding , Rheumatic fever.  Sonographer:    Leavy Cella RDCS (AE) Referring  Phys: [place]  1. Left ventricular ejection fraction, by visual estimation, is 60 to 65%. The left ventricle has normal function. There is no left ventricular hypertrophy.  2. Left ventricular diastolic parameters are indeterminate.  3. The left ventricle has no regional wall motion abnormalities.  4. Global right ventricle has normal systolic function.The right ventricular size is normal. No increase in right ventricular wall thickness.  5. Left atrial size was normal.  6. Right atrial size was normal.  7. The mitral valve is normal in structure. No evidence of mitral valve regurgitation. No evidence of mitral stenosis.  8. The tricuspid valve is normal in structure. Tricuspid valve regurgitation is not demonstrated.  9. The aortic valve is normal in structure. Aortic valve regurgitation is not visualized. No evidence of aortic valve sclerosis or stenosis. 10. The pulmonic valve was not well visualized. Pulmonic valve regurgitation is not visualized. 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 12. Aortic dilatation noted. 13. There is mild dilatation of the aortic root. 14. The inferior vena cava is normal in size with greater than 50% respiratory variability, suggesting right atrial pressure of 3 mmHg. 15. The interatrial septum was not well visualized. FINDINGS  Left Ventricle: Left ventricular ejection fraction, by visual estimation, is 60 to 65%. The left ventricle has  normal function. The left ventricle has no regional wall motion abnormalities. There is no left ventricular hypertrophy. Left ventricular diastolic parameters are indeterminate. Right Ventricle: The right ventricular size is normal. No increase in right ventricular wall thickness. Global RV systolic function is has normal systolic function. Left Atrium: Left atrial size was normal in size. Right Atrium: Right atrial size was normal in size Pericardium: There is no evidence of pericardial effusion. Mitral Valve: The mitral valve is  normal in structure. No evidence of mitral valve regurgitation. No evidence of mitral valve stenosis by observation. Tricuspid Valve: The tricuspid valve is normal in structure. Tricuspid valve regurgitation is not demonstrated. Aortic Valve: The aortic valve is normal in structure. Aortic valve regurgitation is not visualized. The aortic valve is structurally normal, with no evidence of sclerosis or stenosis. Aortic valve mean gradient measures 2.0 mmHg. Aortic valve peak gradient measures 3.3 mmHg. Aortic valve area, by VTI measures 3.51 cm. Pulmonic Valve: The pulmonic valve was not well visualized. Pulmonic valve regurgitation is not visualized. Pulmonic regurgitation is not visualized. No evidence of pulmonic stenosis. Aorta: Aortic dilatation noted. There is mild dilatation of the aortic root. 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 Venous: The inferior vena cava is normal in size with greater than 50% respiratory variability, suggesting right atrial pressure of 3 mmHg. IAS/Shunts: The interatrial septum was not well visualized.  LEFT VENTRICLE PLAX 2D LVIDd:         4.09 cm  Diastology LVIDs:         2.29 cm  LV e' lateral:   8.38 cm/s LV PW:         0.98 cm  LV E/e' lateral: 6.9 LV IVS:        0.77 cm  LV e' medial:    6.74 cm/s LVOT diam:     2.30 cm  LV E/e' medial:  8.6 LV SV:         56 ml LV SV Index:   31.09 LVOT Area:     4.15 cm  RIGHT VENTRICLE RV S prime:     13.60 cm/s TAPSE (M-mode): 2.2 cm LEFT ATRIUM             Index       RIGHT ATRIUM          Index LA diam:        2.20 cm 1.21 cm/m  RA Area:     8.66 cm LA Vol (A2C):   42.4 ml 23.29 ml/m RA Volume:   18.00 ml 9.89 ml/m LA Vol (A4C):   18.2 ml 10.00 ml/m LA Biplane Vol: 29.5 ml 16.20 ml/m  AORTIC VALVE AV Area (Vmax):    3.65 cm AV Area (Vmean):   2.99 cm AV Area (VTI):     3.51 cm AV Vmax:           91.22 cm/s AV Vmean:          67.919 cm/s AV VTI:            0.179 m AV Peak Grad:      3.3 mmHg AV Mean Grad:      2.0 mmHg  LVOT Vmax:         80.03 cm/s LVOT Vmean:        48.872 cm/s LVOT VTI:          0.151 m LVOT/AV VTI ratio: 0.84  AORTA Ao Root diam: 3.50 cm MITRAL VALVE MV Area (PHT): 2.66 cm  SHUNTS MV PHT:        82.65 msec           Systemic VTI:  0.15 m MV Decel Time: 285 msec             Systemic Diam: 2.30 cm MV E velocity: 57.70 cm/s 103 cm/s MV A velocity: 62.60 cm/s 70.3 cm/s MV E/A ratio:  0.92       1.5  Dina Rich MD Electronically signed by Dina Rich MD Signature Date/Time: 10/16/2019/2:49:20 PM    Final     Pending Labs Unresulted Labs (From admission, onward)    Start     Ordered   10/17/19 0500  Basic metabolic panel  Tomorrow morning,   R     10/16/19 0907   10/16/19 0500  CBC  Daily,   R     10/16/19 0226   10/16/19 0217  SARS CORONAVIRUS 2 (TAT 6-24 HRS) Nasopharyngeal Nasopharyngeal Swab  (Tier 3 (TAT 6-24 hrs))  Once,   STAT    Question Answer Comment  Is this test for diagnosis or screening Screening   Symptomatic for COVID-19 as defined by CDC No   Hospitalized for COVID-19 No   Admitted to ICU for COVID-19 No   Previously tested for COVID-19 Yes   Resident in a congregate (group) care setting Unknown   Employed in healthcare setting Unknown      10/16/19 0216          Vitals/Pain Today's Vitals   10/16/19 1130 10/16/19 1200 10/16/19 1230 10/16/19 1438  BP: 125/83 131/83 135/86   Pulse: 87 81 88   Resp: Temp:      TempSrc:      SpO2: 95% 95% 95%   Weight:      Height:      PainSc:    0-No pain    Isolation Precautions No active isolations  Medications Medications  butalbital-acetaminophen-caffeine (FIORICET) 50-325-40 MG per tablet 1 tablet (has no administration in time range)  acetaminophen (TYLENOL) tablet 650 mg (has no administration in time range)    Or  acetaminophen (TYLENOL) suppository 650 mg (has no administration in time range)  ondansetron (ZOFRAN) tablet 4 mg (has no administration in time range)    Or   ondansetron (ZOFRAN) injection 4 mg (has no administration in time range)  apixaban (ELIQUIS) tablet 10 mg (10 mg Oral Given 10/16/19 0946)    Followed by  apixaban (ELIQUIS) tablet 5 mg (has no administration in time range)  iohexol (OMNIPAQUE) 350 MG/ML injection 100 mL (100 mLs Intravenous Contrast Given 10/16/19 0147)  heparin bolus via infusion 4,000 Units (4,000 Units Intravenous Bolus from Bag 10/16/19 0308)    Mobility walks Low fall risk   Focused Assessments    R Recommendations: See Admitting Provider Note  Report given to:   Additional Notes:

## 2019-10-17 DIAGNOSIS — R042 Hemoptysis: Secondary | ICD-10-CM

## 2019-10-17 DIAGNOSIS — D696 Thrombocytopenia, unspecified: Secondary | ICD-10-CM

## 2019-10-17 LAB — BASIC METABOLIC PANEL
Anion gap: 7 (ref 5–15)
BUN: 11 mg/dL (ref 8–23)
CO2: 30 mmol/L (ref 22–32)
Calcium: 9.3 mg/dL (ref 8.9–10.3)
Chloride: 104 mmol/L (ref 98–111)
Creatinine, Ser: 1.17 mg/dL (ref 0.61–1.24)
GFR calc Af Amer: >60 mL/min (ref >60)
GFR calc non Af Amer: >60 mL/min (ref >60)
Glucose, Bld: 88 mg/dL (ref 70–99)
Potassium: 3.8 mmol/L (ref 3.5–5.1)
Sodium: 141 mmol/L (ref 135–145)

## 2019-10-17 LAB — CBC
HCT: 34.7 % — ABNORMAL LOW (ref 39.0–52.0)
Hemoglobin: 11.6 g/dL — ABNORMAL LOW (ref 13.0–17.0)
MCH: 36.5 pg — ABNORMAL HIGH (ref 26.0–34.0)
MCHC: 33.4 g/dL (ref 30.0–36.0)
MCV: 109.1 fL — ABNORMAL HIGH (ref 80.0–100.0)
Platelets: 88 10*3/uL — ABNORMAL LOW (ref 150–400)
RBC: 3.18 MIL/uL — ABNORMAL LOW (ref 4.22–5.81)
RDW: 15.1 % (ref 11.5–15.5)
WBC: 4.1 10*3/uL (ref 4.0–10.5)
nRBC: 0 % (ref 0.0–0.2)

## 2019-10-17 MED ORDER — ELIQUIS DVT/PE STARTER PACK 5 MG PO TBPK: ORAL_TABLET | ORAL | 0 refills | Status: DC

## 2019-10-17 NOTE — Progress Notes (Signed)
NURSING PROGRESS NOTE  Brian Stark [id number] Discharge Data: 10/17/2019 11:22 AM Attending Provider: Murlean Iba, MD EGB:TDVV, Edwinna Areola, MD     Simon Rhein to be D/C'd Home per MD order.  Discussed with the patient the After Visit Summary and all questions fully answered. All IV's discontinued with no bleeding noted. All belongings returned to patient for patient to take home.   Last Vital Signs:  Blood pressure 123/77, pulse 72, temperature 98.5 F (36.9 C), temperature source Oral, resp. rate 19, height 5\' 10"  (1.778 m), weight 65.4 kg, SpO2 94 %.  Discharge Medication List Allergies as of 10/17/2019      Reactions   Flucloxacillin Anaphylaxis      Medication List    STOP taking these medications   cyanocobalamin 1000 MCG/ML injection Commonly known as: (VITAMIN B-12)   levofloxacin 500 MG tablet Commonly known as: LEVAQUIN   methylPREDNISolone 4 MG Tbpk tablet Commonly known as: MEDROL DOSEPAK     TAKE these medications   cyclobenzaprine 10 MG tablet Commonly known as: FLEXERIL Take 10 mg by mouth every 8 (eight) hours as needed.   Eliquis DVT/PE Starter Pack 5 MG Tbpk Generic drug: Apixaban Starter Pack Take as directed on package: start with two-5mg  tablets twice daily for 7 days. On day 8, switch to one-5mg  tablet twice daily.   HYDROcodone-acetaminophen 5-325 MG tablet Commonly known as: NORCO/VICODIN Take 1 tablet by mouth every 6 (six) hours as needed.   Polyethyl Glycol-Propyl Glycol 0.4-0.3 % Soln Apply 1 drop to eye.        Doristine Devoid, RN

## 2019-10-17 NOTE — Evaluation (Signed)
Physical Therapy Evaluation Patient Details Name: Brian Stark MRN: [id number] DOB: [birth year] Today's Date: 10/17/2019   History of Present Illness  Eder Macek  is a 73 y.o. male, with history of migraines, bilateral craniotomies for subdural hematoma evacuation came to hospital with chief complaint of hemoptysis.  Patient says that he was prescribed methylprednisolone and Levaquin by his PCP for pneumonia and he has been taking antibiotic for past 1 week.  Today he noticed that he had 3 episodes of hemoptysis which started around 4 PM.    Clinical Impression  Patient functioning at baseline for functional mobility and gait.  Patient demonstrates good return for ambulation in room/hallways and up/down steps in stairwell without loss of balance.  Plan:  Patient discharged from physical therapy to care of nursing for ambulation daily as tolerated for length of stay.     Follow Up Recommendations No PT follow up    Equipment Recommendations  None recommended by PT    Recommendations for Other Services       Precautions / Restrictions Precautions Precautions: None Restrictions Weight Bearing Restrictions: No      Mobility  Bed Mobility Overal bed mobility: Modified Independent             General bed mobility comments: slightly increased time  Transfers Overall transfer level: Modified independent Equipment used: Straight cane             General transfer comment: slightly increased time  Ambulation/Gait Ambulation/Gait assistance: Modified independent (Device/Increase time) Gait Distance (Feet): 120 Feet Assistive device: Straight cane Gait Pattern/deviations: WFL(Within Functional Limits);Step-through pattern Gait velocity: decreased   General Gait Details: no loss of balance ambulating in room and hallways  Stairs Stairs: Yes Stairs assistance: Modified independent (Device/Increase time) Stair Management: One rail Left;One rail Right;Alternating  pattern;With cane Number of Stairs: 10 General stair comments: demonstrates good return for going up/down steps using SPC and 1 siderail without loss of balance  Wheelchair Mobility    Modified Rankin (Stroke Patients Only)       Balance Overall balance assessment: Needs assistance Sitting-balance support: Feet supported;No upper extremity supported Sitting balance-Leahy Scale: Good Sitting balance - Comments: seated at bedside   Standing balance support: During functional activity;Single extremity supported Standing balance-Leahy Scale: Good Standing balance comment: using SPC                             Pertinent Vitals/Pain Pain Assessment: No/denies pain    Home Living Family/patient expects to be discharged to:: Private residence Living Arrangements: Alone Available Help at Discharge: Family;Friend(s)   Home Access: Stairs to enter Entrance Stairs-Rails: None;Right(going up) Entrance Stairs-Number of Steps: 16 Home Layout: Two level;Able to live on main level with bedroom/bathroom;Bed/bath upstairs Home Equipment: Cane - single point;Walker - 4 wheels;Shower seat      Prior Function Level of Independence: Independent with assistive device(s)         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, drives     Hand Dominance   Dominant Hand: Right    Extremity/Trunk Assessment   Upper Extremity Assessment Upper Extremity Assessment: Overall WFL for tasks assessed    Lower Extremity Assessment Lower Extremity Assessment: Overall WFL for tasks assessed    Cervical / Trunk Assessment Cervical / Trunk Assessment: Normal  Communication   Communication: No difficulties  Cognition Arousal/Alertness: Awake/alert Behavior During Therapy: WFL for tasks assessed/performed Overall Cognitive Status: Within Functional Limits for tasks assessed  General Comments      Exercises     Assessment/Plan     PT Assessment Patent does not need any further PT services  PT Problem List         PT Treatment Interventions      PT Goals (Current goals can be found in the Care Plan section)  Acute Rehab PT Goals Patient Stated Goal: return home with family/friends to assist if needed PT Goal Formulation: With patient Time For Goal Achievement: 10/17/19 Potential to Achieve Goals: Good    Frequency     Barriers to discharge        Co-evaluation               AM-PAC PT "6 Clicks" Mobility  Outcome Measure Help needed turning from your back to your side while in a flat bed without using bedrails?: None Help needed moving from lying on your back to sitting on the side of a flat bed without using bedrails?: None Help needed moving to and from a bed to a chair (including a wheelchair)?: None Help needed standing up from a chair using your arms (e.g., wheelchair or bedside chair)?: None Help needed to walk in hospital room?: None Help needed climbing 3-5 steps with a railing? : None 6 Click Score: 24    End of Session   Activity Tolerance: Patient tolerated treatment well Patient left: in bed;with call bell/phone within reach(seated at bedside) Nurse Communication: Mobility status PT Visit Diagnosis: Unsteadiness on feet (R26.81);Other abnormalities of gait and mobility (R26.89);Muscle weakness (generalized) (M62.81)    Time: 6759-1638 PT Time Calculation (min) (ACUTE ONLY): 30 min   Charges:   PT Evaluation $PT Eval Moderate Complexity: 1 Mod PT Treatments $Therapeutic Activity: 23-37 mins        1:59 PM, 10/17/19 Lonell Grandchild, MPT Physical Therapist with [facility] 336 [phone] office [phone] mobile phone

## 2019-10-17 NOTE — Discharge Summary (Signed)
Physician Discharge Summary  Brian Stark:[id number] DOB: 1946-03-14 DOA: 10/15/2019  PCP: Benita Stabile, MD  Admit date: 10/15/2019 Discharge date: 10/17/2019  Admitted From:  Home  Disposition: Home   Recommendations for Outpatient Follow-up:  1. Follow up with PCP in 1 weeks 2. Establish care with hematology in 1-2 weeks 3. Please obtain CBC in 1 week 4. Bleeding precautions while on anticoagulant therapy  Discharge Condition: STABLE   CODE STATUS: FULL    Brief Hospitalization Summary: Please see all hospital notes, images, labs for full details of the hospitalization. Dr. Daune Perch HPI:  Brian Stark  is a 73 y.o. male, with history of migraines, bilateral craniotomies for subdural hematoma evacuation came to hospital with chief complaint of hemoptysis.  Patient says that he was prescribed methylprednisolone and Levaquin by his PCP for pneumonia and he has been taking antibiotic for past 1 week.  Today he noticed that he had 3 episodes of hemoptysis which started around 4 PM. Patient came to the ED for further evaluation. CT chest showed acute bilateral pulmonary emboli greatest within the left lower lobe. Patient started on IV heparin. He denies nausea vomiting or diarrhea. Complains of chest pain on left side   1. Pulmonary embolism-was initially started on heparin protocol and has been transitioned by pharm D to oral apixaban starter pack.  He denies prolonged bedrest, no recent surgery, no long trips.  [place] of both lower extremities negative for PE.   Echocardiogram was completed with no signs of right heart strain.  He has had no significant bleeding and Hg has remained stable.  He is not requiring oxygen.  He is ambulating.  He will discharge home and follow up with PCP and establish care with hematologist to further investigate causes of his thrombosis.  2. Thrombocytopenia-chronic, platelet count is 81,000.   Will need hematology evaluation as outpatient.  I have  recommended he establish care with Dr. Ellin Saba in 1-2 weeks for further evaluation.  Pt verbalized understanding.    DVT Prophylaxis-   apixaban  AM Labs Ordered, also please review Full Orders  Family Communication: Admission, patients condition and plan of care including tests being ordered have been discussed with the patient  who indicate understanding and agree with the plan and Code Status.  Code Status: Full code   Discharge Diagnoses:  Active Problems:   PE (pulmonary thromboembolism) (HCC)   Thrombocytopenia (HCC)   Pulmonary infarction (HCC)   Hemoptysis   Acute pulmonary embolism [facility])   Discharge Instructions:  Allergies as of 10/17/2019      Reactions   Flucloxacillin Anaphylaxis      Medication List    STOP taking these medications   cyanocobalamin 1000 MCG/ML injection Commonly known as: (VITAMIN B-12)   levofloxacin 500 MG tablet Commonly known as: LEVAQUIN   methylPREDNISolone 4 MG Tbpk tablet Commonly known as: MEDROL DOSEPAK     TAKE these medications   cyclobenzaprine 10 MG tablet Commonly known as: FLEXERIL Take 10 mg by mouth every 8 (eight) hours as needed.   Eliquis DVT/PE Starter Pack 5 MG Tbpk Generic drug: Apixaban Starter Pack Take as directed on package: start with two-5mg  tablets twice daily for 7 days. On day 8, switch to one-5mg  tablet twice daily.   HYDROcodone-acetaminophen 5-325 MG tablet Commonly known as: NORCO/VICODIN Take 1 tablet by mouth every 6 (six) hours as needed.   Polyethyl Glycol-Propyl Glycol 0.4-0.3 % Soln Apply 1 drop to eye.      Follow-up Information  Doreatha Massed, MD. Schedule an appointment as soon as possible for a visit today.   Specialty: Hematology Why: Establish Care for Blood Clotting Evaluation  Contact information: [address] [place] [address] [phone]        Benita Stabile, MD. Schedule an appointment as soon as possible for a visit in 1 week(s).   Specialty:  Internal Medicine Why: Hospital Follow Up  Contact information: [address] [place] [facility] [address] [phone]          Allergies  Allergen Reactions  . Flucloxacillin Anaphylaxis   Allergies as of 10/17/2019      Reactions   Flucloxacillin Anaphylaxis      Medication List    STOP taking these medications   cyanocobalamin 1000 MCG/ML injection Commonly known as: (VITAMIN B-12)   levofloxacin 500 MG tablet Commonly known as: LEVAQUIN   methylPREDNISolone 4 MG Tbpk tablet Commonly known as: MEDROL DOSEPAK     TAKE these medications   cyclobenzaprine 10 MG tablet Commonly known as: FLEXERIL Take 10 mg by mouth every 8 (eight) hours as needed.   Eliquis DVT/PE Starter Pack 5 MG Tbpk Generic drug: Apixaban Starter Pack Take as directed on package: start with two-5mg  tablets twice daily for 7 days. On day 8, switch to one-5mg  tablet twice daily.   HYDROcodone-acetaminophen 5-325 MG tablet Commonly known as: NORCO/VICODIN Take 1 tablet by mouth every 6 (six) hours as needed.   Polyethyl Glycol-Propyl Glycol 0.4-0.3 % Soln Apply 1 drop to eye.       Procedures/Studies: CT ABDOMEN PELVIS WO CONTRAST  Result Date: 09/19/2019 CLINICAL DATA:  Jaundice. Intermittent right upper quadrant pain and right upper back pain. EXAM: CT ABDOMEN AND PELVIS WITHOUT CONTRAST TECHNIQUE: Multidetector CT imaging of the abdomen and pelvis was performed following the standard protocol without IV contrast. COMPARISON:  04/13/2007. FINDINGS: Lower chest: Lung bases show mild bronchiectasis with ground-glass and subpleural consolidation in the right lower lobe. No pleural fluid. Heart size normal. No pericardial effusion. Prepericardiac/juxtadiaphragmatic lymph nodes are subcentimeter in short axis size. Distal esophagus is grossly unremarkable. Hepatobiliary: Intrahepatic and extrahepatic biliary ductal dilatation appears similar to 04/13/2007. Liver is otherwise unremarkable.  Cholecystectomy. Pancreas: Negative. Spleen: Negative. Adrenals/Urinary Tract: Adrenal glands and right kidney are unremarkable. A stone is seen in the lower pole left kidney. Low-attenuation lesions in the left kidney measure up to 3.1 cm and are likely cysts. Ureters are decompressed. Bladder may be minimally thick-walled. Stomach/Bowel: Stomach is unremarkable. There may be a lipoma in the duodenum. Small bowel and appendix are otherwise unremarkable. Fair amount of stool is seen in the colon, indicative of constipation. Vascular/Lymphatic: Atherosclerotic calcification of the aorta without aneurysm. No pathologically enlarged lymph nodes. Reproductive: Prostate is mildly enlarged. Other: Mesenteries and peritoneum are unremarkable.  No free fluid. Musculoskeletal: Degenerative changes in the spine. No worrisome lytic or sclerotic lesions. IMPRESSION: 1. No acute findings in the abdomen or pelvis. 2. New ground-glass and consolidation in the right lower lobe may be due to atelectasis. Difficult to exclude pneumonia. 3. Left renal stone. 4. Mildly enlarged prostate. 5.  Aortic atherosclerosis (ICD10-170.0). Electronically Signed   By: Leanna Battles M.D.   On: 09/19/2019 13:43   CTA Chest for PE  Result Date: 10/16/2019 CLINICAL DATA:  Congestion and shortness of breath. Hemoptysis EXAM: CT ANGIOGRAPHY CHEST WITH CONTRAST TECHNIQUE: Multidetector CT imaging of the chest was performed using the standard protocol during bolus administration of intravenous contrast. Multiplanar CT image reconstructions and MIPs  were obtained to evaluate the vascular anatomy. CONTRAST:  100mL OMNIPAQUE IOHEXOL 350 MG/ML SOLN COMPARISON:  None. FINDINGS: Cardiovascular: Contrast injection is sufficient to demonstrate satisfactory opacification of the pulmonary arteries to the segmental level.There are acute bilateral pulmonary emboli, greatest within the left lower lobe where there are lobar, segmental, and subsegmental pulmonary  emboli. There are segmental pulmonary emboli involving the right lower lobe and left upper lobe. The main pulmonary artery is within normal limits for size. There is no CT evidence of acute right heart strain. There are atherosclerotic changes of the visualized thoracic aorta without evidence for dissection. Heart size is normal, without pericardial effusion. Mediastinum/Nodes: --No mediastinal or hilar lymphadenopathy. --No axillary lymphadenopathy. --No supraclavicular lymphadenopathy. --Normal thyroid gland. --the esophagus is fluid-filled to the level of the upper thorax. Lungs/Pleura: There is no pneumothorax. The trachea is unremarkable. There are hazy airspace opacities at the left lung base which may represent developing pulmonary infarcts in the setting of acute pulmonary emboli. There is no significant pleural effusion. Upper Abdomen: The patient is status post cholecystectomy. There is significant intrahepatic biliary ductal dilatation involving the left hepatic lobe. Musculoskeletal: No chest wall abnormality. No acute or significant osseous findings. Review of the MIP images confirms the above findings. IMPRESSION: 1. Acute bilateral pulmonary emboli as detailed above, greatest within the left lower lobe. There is no CT evidence for right heart strain. 2. Hazy airspace opacities at the left lung base which may represent developing pulmonary infarcts in the setting of acute pulmonary emboli. However, a developing infectious process cannot be excluded. 3. The esophagus is fluid-filled to the level of the upper thorax. This places the patient at risk for aspiration. Aortic Atherosclerosis (ICD10-I70.0). These results were called by telephone at the time of interpretation on 10/16/2019 at 2:10 am to provider [facility] , who verbally acknowledged these results. Electronically Signed   By: Katherine Mantlehristopher  Green M.D.   On: 10/16/2019 02:14   US Venous Img Lower Bilateral  Result Date: 10/16/2019 CLINICAL  DATA:  Pulmonary embolus. EXAM: BILATERAL LOWER EXTREMITY VENOUS DOPPLER ULTRASOUND TECHNIQUE: Gray-scale sonography with graded compression, as well as color Doppler and duplex ultrasound were performed to evaluate the lower extremity deep venous systems from the level of the common femoral vein and including the common femoral, femoral, profunda femoral, popliteal and calf veins including the posterior tibial, peroneal and gastrocnemius veins when visible. The superficial great saphenous vein was also interrogated. Spectral Doppler was utilized to evaluate flow at rest and with distal augmentation maneuvers in the common femoral, femoral and popliteal veins. COMPARISON:  None. FINDINGS: RIGHT LOWER EXTREMITY Common Femoral Vein: No evidence of thrombus. Normal compressibility, respiratory phasicity and response to augmentation. Saphenofemoral Junction: No evidence of thrombus. Normal compressibility and flow on color Doppler imaging. Profunda Femoral Vein: No evidence of thrombus. Normal compressibility and flow on color Doppler imaging. Femoral Vein: No evidence of thrombus. Normal compressibility, respiratory phasicity and response to augmentation. Popliteal Vein: No evidence of thrombus. Normal compressibility, respiratory phasicity and response to augmentation. Calf Veins: No evidence of thrombus. Normal compressibility and flow on color Doppler imaging. Superficial Great Saphenous Vein: No evidence of thrombus. Normal compressibility. Venous Reflux:  None. Other Findings:  None. LEFT LOWER EXTREMITY Common Femoral Vein: No evidence of thrombus. Normal compressibility, respiratory phasicity and response to augmentation. Saphenofemoral Junction: No evidence of thrombus. Normal compressibility and flow on color Doppler imaging. Profunda Femoral Vein: No evidence of thrombus. Normal compressibility and flow on color Doppler imaging. Femoral Vein:  No evidence of thrombus. Normal compressibility, respiratory  phasicity and response to augmentation. Popliteal Vein: No evidence of thrombus. Normal compressibility, respiratory phasicity and response to augmentation. Calf Veins: No evidence of thrombus. Normal compressibility and flow on color Doppler imaging. Superficial Great Saphenous Vein: No evidence of thrombus. Normal compressibility. Venous Reflux:  None. Other Findings:  None. IMPRESSION: No evidence of deep venous thrombosis in either lower extremity. Electronically Signed   By: Lupita Raider M.D.   On: 10/16/2019 11:37   ECHOCARDIOGRAM COMPLETE  Result Date: 10/16/2019   ECHOCARDIOGRAM REPORT   Patient Name:   LUCION DILGER Date of Exam: 10/16/2019 Medical Rec #:  [id number]       Height:       70.0 in Accession #:    [id number]      Weight:       145.0 lb Date of Birth:  03/20/1946       BSA:          1.82 m Patient Age:    73 years        BP:           151/89 mmHg Patient Gender: M               HR:           110 bpm. [facility] Procedure: 2D Echo Indications:    Pulmonary Embolus 415.19 / I26.99  History:        Patient has no prior history of Echocardiogram examinations.                 Risk Factors:Current Smoker. PE (pulmonary thromboembolism,                 Subdural hematoma ,Subdural bleeding , Rheumatic fever.  Sonographer:    Jeryl Columbia RDCS (AE) Referring Phys: Gomez Cleverly LAMA IMPRESSIONS  1. Left ventricular ejection fraction, by visual estimation, is 60 to 65%. The left ventricle has normal function. There is no left ventricular hypertrophy.  2. Left ventricular diastolic parameters are indeterminate.  3. The left ventricle has no regional wall motion abnormalities.  4. Global right ventricle has normal systolic function.The right ventricular size is normal. No increase in right ventricular wall thickness.  5. Left atrial size was normal.  6. Right atrial size was normal.  7. The mitral valve is normal in structure. No evidence of mitral valve regurgitation. No evidence  of mitral stenosis.  8. The tricuspid valve is normal in structure. Tricuspid valve regurgitation is not demonstrated.  9. The aortic valve is normal in structure. Aortic valve regurgitation is not visualized. No evidence of aortic valve sclerosis or stenosis. 10. The pulmonic valve was not well visualized. Pulmonic valve regurgitation is not visualized. 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 12. Aortic dilatation noted. 13. There is mild dilatation of the aortic root. 14. The inferior vena cava is normal in size with greater than 50% respiratory variability, suggesting right atrial pressure of 3 mmHg. 15. The interatrial septum was not well visualized. FINDINGS  Left Ventricle: Left ventricular ejection fraction, by visual estimation, is 60 to 65%. The left ventricle has normal function. The left ventricle has no regional wall motion abnormalities. There is no left ventricular hypertrophy. Left ventricular diastolic parameters are indeterminate. Right Ventricle: The right ventricular size is normal. No increase in right ventricular wall thickness. Global RV systolic function is has normal systolic function. Left Atrium: Left atrial size was normal in  size. Right Atrium: Right atrial size was normal in size Pericardium: There is no evidence of pericardial effusion. Mitral Valve: The mitral valve is normal in structure. No evidence of mitral valve regurgitation. No evidence of mitral valve stenosis by observation. Tricuspid Valve: The tricuspid valve is normal in structure. Tricuspid valve regurgitation is not demonstrated. Aortic Valve: The aortic valve is normal in structure. Aortic valve regurgitation is not visualized. The aortic valve is structurally normal, with no evidence of sclerosis or stenosis. Aortic valve mean gradient measures 2.0 mmHg. Aortic valve peak gradient measures 3.3 mmHg. Aortic valve area, by VTI measures 3.51 cm. Pulmonic Valve: The pulmonic valve was not well visualized. Pulmonic valve  regurgitation is not visualized. Pulmonic regurgitation is not visualized. No evidence of pulmonic stenosis. Aorta: Aortic dilatation noted. There is mild dilatation of the aortic root. 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 Venous: The inferior vena cava is normal in size with greater than 50% respiratory variability, suggesting right atrial pressure of 3 mmHg. IAS/Shunts: The interatrial septum was not well visualized.  LEFT VENTRICLE PLAX 2D LVIDd:         4.09 cm  Diastology LVIDs:         2.29 cm  LV e' lateral:   8.38 cm/s LV PW:         0.98 cm  LV E/e' lateral: 6.9 LV IVS:        0.77 cm  LV e' medial:    6.74 cm/s LVOT diam:     2.30 cm  LV E/e' medial:  8.6 LV SV:         56 ml LV SV Index:   31.09 LVOT Area:     4.15 cm  RIGHT VENTRICLE RV S prime:     13.60 cm/s TAPSE (M-mode): 2.2 cm LEFT ATRIUM             Index       RIGHT ATRIUM          Index LA diam:        2.20 cm 1.21 cm/m  RA Area:     8.66 cm LA Vol (A2C):   42.4 ml 23.29 ml/m RA Volume:   18.00 ml 9.89 ml/m LA Vol (A4C):   18.2 ml 10.00 ml/m LA Biplane Vol: 29.5 ml 16.20 ml/m  AORTIC VALVE AV Area (Vmax):    3.65 cm AV Area (Vmean):   2.99 cm AV Area (VTI):     3.51 cm AV Vmax:           91.22 cm/s AV Vmean:          67.919 cm/s AV VTI:            0.179 m AV Peak Grad:      3.3 mmHg AV Mean Grad:      2.0 mmHg LVOT Vmax:         80.03 cm/s LVOT Vmean:        48.872 cm/s LVOT VTI:          0.151 m LVOT/AV VTI ratio: 0.84  AORTA Ao Root diam: 3.50 cm MITRAL VALVE MV Area (PHT): 2.66 cm             SHUNTS MV PHT:        82.65 msec           Systemic VTI:  0.15 m MV Decel Time: 285 msec             Systemic  Diam: 2.30 cm MV E velocity: 57.70 cm/s 103 cm/s MV A velocity: 62.60 cm/s 70.3 cm/s MV E/A ratio:  0.92       1.5  Dina Rich MD Electronically signed by Dina Rich MD Signature Date/Time: 10/16/2019/2:49:20 PM    Final       Subjective: Pt reports blood tinged sputum but otherwise no problems breathing or  ambulating, no chest  Pain, no SOB.    Discharge Exam: Vitals:   10/17/19 0617 10/17/19 0826  BP: 124/79 118/86  Pulse: 82 77  Resp: 20 19  Temp: 98.3 F (36.8 C) 98 F (36.7 C)  SpO2: 94% 94%   Vitals:   10/16/19 1930 10/16/19 2025 10/17/19 0617 10/17/19 0826  BP: 126/80 (!) 141/86 124/79 118/86  Pulse: 92 83 82 77  Resp: Temp:  99.3 F (37.4 C) 98.3 F (36.8 C) 98 F (36.7 C)  TempSrc:  Oral Oral Oral  SpO2: 95% 100% 94% 94%  Weight:  65.4 kg    Height:   (1.778 m)     General: Pt is alert, awake, not in acute distress Cardiovascular: normal S1/S2 +, no rubs, no gallops Respiratory: CTA bilaterally, no wheezing, no rhonchi Abdominal: Soft, NT, ND, bowel sounds + Extremities: no edema, no cyanosis   The results of significant diagnostics from this hospitalization (including imaging, microbiology, ancillary and laboratory) are listed below for reference.     Microbiology: Recent Results (from the past 240 hour(s))  SARS CORONAVIRUS 2 (TAT 6-24 HRS) Nasopharyngeal Nasopharyngeal Swab     Status: None   Collection Time: 10/16/19  3:12 AM   Specimen: Nasopharyngeal Swab  Result Value Ref Range Status   SARS Coronavirus 2 NEGATIVE NEGATIVE Final    Comment: (NOTE) SARS-CoV-2 target nucleic acids are NOT DETECTED. The SARS-CoV-2 RNA is generally detectable in upper and lower respiratory specimens during the acute phase of infection. Negative results do not preclude SARS-CoV-2 infection, do not rule out co-infections with other pathogens, and should not be used as the sole basis for treatment or other patient management decisions. Negative results must be combined with clinical observations, patient history, and epidemiological information. The expected result is Negative. Fact Sheet for Patients: HairSlick.no Fact Sheet for Healthcare Providers: quierodirigir.com This test is not yet approved  or cleared by the [place] FDA and  has been authorized for detection and/or diagnosis of SARS-CoV-2 by FDA under an Emergency Use Authorization (EUA). This EUA will remain  in effect (meaning this test can be used) for the duration of the COVID-19 declaration under Section 56 4(b)(1) of the Act, 21 U.S.C. section 360bbb-3(b)(1), unless the authorization is terminated or revoked sooner. Performed at [facility], [address], [place], [place] [address]      Labs: BNP (last 3 results) No results for input(s): BNP in the last 8760 hours. Basic Metabolic Panel: Recent Labs  Lab 10/15/19 2019 10/16/19 0721 10/17/19 0612  NA 133* 139 141  K 3.5 3.6 3.8  CL 100 104 104  CO2 GLUCOSE 113* 106* 88  BUN CREATININE 1.33* 1.23 1.17  CALCIUM 9.5 9.2 9.3   Liver Function Tests: No results for input(s): AST, ALT, ALKPHOS, BILITOT, PROT, ALBUMIN in the last 168 hours. No results for input(s): LIPASE, AMYLASE in the last 168 hours. No results for input(s): AMMONIA in the last 168 hours. CBC: Recent Labs  Lab 10/15/19 2019 10/16/19 0443 10/17/19 6045  WBC 11.6* 8.4 4.1  NEUTROABS 9.6*  --   --   HGB 13.0 12.2* 11.6*  HCT 39.3 36.7* 34.7*  MCV 108.0* 107.0* 109.1*  PLT 80* 81* 88*   Cardiac Enzymes: No results for input(s): CKTOTAL, CKMB, CKMBINDEX, TROPONINI in the last 168 hours. BNP: Invalid input(s): POCBNP CBG: No results for input(s): GLUCAP in the last 168 hours. D-Dimer Recent Labs    10/15/19 2019  DDIMER 2.70*   Hgb A1c No results for input(s): HGBA1C in the last 72 hours. Lipid Profile No results for input(s): CHOL, HDL, LDLCALC, TRIG, CHOLHDL, LDLDIRECT in the last 72 hours. Thyroid function studies No results for input(s): TSH, T4TOTAL, T3FREE, THYROIDAB in the last 72 hours.  Invalid input(s): FREET3 Anemia work up No results for input(s): VITAMINB12, FOLATE, FERRITIN, TIBC, IRON, RETICCTPCT in the last 72  hours. Urinalysis    Component Value Date/Time   COLORURINE YELLOW 09/05/2018 2312   APPEARANCEUR CLEAR 09/05/2018 2312   LABSPEC <1.005 (L) 09/05/2018 2312   PHURINE 6.0 09/05/2018 2312   GLUCOSEU NEGATIVE 09/05/2018 2312   HGBUR TRACE (A) 09/05/2018 2312   BILIRUBINUR NEGATIVE 09/05/2018 2312   KETONESUR NEGATIVE 09/05/2018 2312   PROTEINUR NEGATIVE 09/05/2018 2312   NITRITE NEGATIVE 09/05/2018 2312   LEUKOCYTESUR NEGATIVE 09/05/2018 2312   Sepsis Labs Invalid input(s): PROCALCITONIN,  WBC,  LACTICIDVEN Microbiology Recent Results (from the past 240 hour(s))  SARS CORONAVIRUS 2 (TAT 6-24 HRS) Nasopharyngeal Nasopharyngeal Swab     Status: None   Collection Time: 10/16/19  3:12 AM   Specimen: Nasopharyngeal Swab  Result Value Ref Range Status   SARS Coronavirus 2 NEGATIVE NEGATIVE Final    Comment: (NOTE) SARS-CoV-2 target nucleic acids are NOT DETECTED. The SARS-CoV-2 RNA is generally detectable in upper and lower respiratory specimens during the acute phase of infection. Negative results do not preclude SARS-CoV-2 infection, do not rule out co-infections with other pathogens, and should not be used as the sole basis for treatment or other patient management decisions. Negative results must be combined with clinical observations, patient history, and epidemiological information. The expected result is Negative. Fact Sheet for Patients: HairSlick.no Fact Sheet for Healthcare Providers: quierodirigir.com This test is not yet approved or cleared by the [place] FDA and  has been authorized for detection and/or diagnosis of SARS-CoV-2 by FDA under an Emergency Use Authorization (EUA). This EUA will remain  in effect (meaning this test can be used) for the duration of the COVID-19 declaration under Section 56 4(b)(1) of the Act, 21 U.S.C. section 360bbb-3(b)(1), unless the authorization is terminated or revoked  sooner. Performed at [facility], [address] [place], [place] [address]    Time coordinating discharge: 31 minutes   SIGNED:  Standley Dakins, MD  [facility] 10/17/2019, 10:57 AM How to contact the [facility] Attending or Consulting provider 7A - 7P or covering provider during after hours 7P -7A, for this patient?  1. Check the care team in [facility] and look for a) attending/consulting [facility] provider listed and b) the [facility] team listed 2. Log into [email or web address] and use [place] universal password to access. If you do not have the password, please contact the hospital operator. 3. Locate the [facility] provider you are looking for under [facility] and page to a number that you can be directly reached. 4. If you still have difficulty reaching the provider, please page the [facility] (Director on Call) for the Hospitalists listed on amion for assistance.

## 2019-10-17 NOTE — Discharge Instructions (Signed)
Bleeding Precautions When on Anticoagulant Therapy, Adult Anticoagulant therapy, also called blood thinner therapy, is medicine that helps to prevent and treat blood clots. The medicine works by stopping blood clots from forming or growing. Blood clots that form in your blood vessels can be dangerous. They can break loose and travel to the heart, lungs, or brain. This increases the risk of a heart attack, stroke, or blocked lung artery (pulmonary embolism). Anticoagulants also increase the risk of bleeding. Try to protect yourself from cuts and other injuries that can cause bleeding. It is important to take anticoagulants exactly as told by your health care provider. Why do I need to be on anticoagulant therapy? You may need this medicine if you are at risk of developing a blood clot. Conditions that increase your risk of a blood clot include:  Being born with heart disease or a heart malformation (congenital heart disease).  Developing heart disease.  Having had surgery, such as valve replacement.  Having had a serious accident or other type of severe injury (trauma).  Having certain types of cancer.  Having certain diseases that can increase blood clotting.  Having a high risk of stroke or heart attack.  Having atrial fibrillation (AF). What are the common anticoagulant medicines? There are several types of anticoagulant medicines. The most common types are:  Medicines that you take by mouth (oral medicines), such as: ? Warfarin. ? Novel oral anticoagulants (NOACs), such as: ? Direct thrombin inhibitors (dabigatran). ? Factor Xa inhibitors (apixaban, edoxaban, and rivaroxaban).  Injections, such as: ? Unfractionated heparin. ? Low molecular weight heparin. These anticoagulants work in different ways to prevent blood clots. They also have different risks and side effects. What do I need to remember while on anticoagulant therapy? Taking anticoagulants  Take your medicine at the  same time every day. If you forget to take your medicine, take it as soon as you remember. Do not double your dosage of medicine if you miss a whole day. Take your normal dose and call your health care provider.  Do not stop taking your medicine unless your health care provider approves. Stopping the medicine can increase your risk of developing a blood clot. Taking other medicines  Take over-the-counter and prescriptions medicines only as told by your health care provider.  Do not take over-the-counter NSAIDs, including aspirin and ibuprofen, while you are on anticoagulant therapy. These medicines increase your risk of dangerous bleeding.  Get approval from your health care provider before you start taking any new medicines, vitamins, or herbal products. Some of these could interfere with your therapy. General instructions  Keep all follow-up visits as told by your health care provider. This is important.  If you are pregnant or trying to get pregnant, talk with a health care provider about anticoagulants. Some of these medicines are not safe to take during pregnancy.  Tell all health care providers, including your dentist, that you are on anticoagulant therapy. It is especially important to tell providers before you have any surgery, medical procedures, or dental work done. What precautions should I take?   Be very careful when using knives, scissors, or other sharp objects.  Use an electric razor instead of a blade.  Do not use toothpicks.  Use a soft-bristled toothbrush. Brush your teeth gently.  Always wear shoes outdoors and wear slippers indoors.  Be careful when cutting your fingernails and toenails.  Place bath mats in the bathroom. If possible, install handrails as well.  Wear gloves while you do  yard work.  Wear your seat belt.  Prevent falls by removing loose rugs and extension cords from areas where you walk. Use a cane or walker if you need it.  Avoid  constipation by: ? Drinking enough fluid to keep your urine clear or pale yellow. ? Eating foods that are high in fiber, such as fresh fruits and vegetables, whole grains, and beans. ? Limiting foods that are high in fat and processed sugars, such as fried and sweet foods.  Do not play contact sports or participate in other activities that have a high risk for injury. What other precautions are important if on warfarin therapy? If you are taking a type of anticoagulant called warfarin, make sure you:  Work with a diet and nutrition specialist (dietitian) to make an eating plan. Do not make any sudden changes to your diet after you have started your eating plan.  Do not drink alcohol. It can interfere with your medicine and increase your risk of an injury that causes bleeding.  Get regular blood tests as told by your health care provider. What are some questions to ask my health care provider?  Why do I need anticoagulant therapy?  What is the best anticoagulant therapy for my condition?  How long will I need anticoagulant therapy?  What are the side effects of anticoagulant therapy?  When should I take my medicine? What should I do if I forget to take it?  Will I need to have regular blood tests?  Do I need to change my diet? Are there foods or drinks that I should avoid?  What activities are safe for me?  What should I do if I want to get pregnant? Contact a health care provider if:  You miss a dose of medicine: ? And you are not sure what to do. ? For more than one day.  You have: ? Menstrual bleeding that is heavier than normal. ? Bloody or brown urine. ? Easy bruising. ? Black and tarry stool or bright red stool. ? Side effects from your medicine.  You feel weak or dizzy.  You become pregnant. Get help right away if:  You have bleeding that will not stop within 20 minutes from: ? The nose. ? The gums. ? A cut on the skin.  You have a severe headache or  stomachache.  You vomit or cough up blood.  You fall or hit your head. Summary  Anticoagulant therapy, also called blood thinner therapy, is medicine that helps to prevent and treat blood clots.  Anticoagulants work in different ways to prevent blood clots. They also have different risks and side effects.  Talk with your health care provider about any precautions that you should take while on anticoagulant therapy. This information is not intended to replace advice given to you by your health care provider. Make sure you discuss any questions you have with your health care provider. Document Released: 10/01/2015 Document Revised: 02/09/2019 Document Reviewed: 01/06/2017 Elsevier Patient Education  2020 [employer]   IMPORTANT INFORMATION: PAY CLOSE ATTENTION   PHYSICIAN DISCHARGE INSTRUCTIONS  Follow with Primary care provider  Benita Stabile, MD  and other consultants as instructed by your Hospitalist Physician  SEEK MEDICAL CARE OR RETURN TO EMERGENCY ROOM IF SYMPTOMS COME BACK, WORSEN OR NEW PROBLEM DEVELOPS   Please note: You were cared for by a hospitalist during your hospital stay. Every effort will be made to forward records to your primary care provider.  You can request that your  primary care provider send for your hospital records if they have not received them.  Once you are discharged, your primary care physician will handle any further medical issues. Please note that NO REFILLS for any discharge medications will be authorized once you are discharged, as it is imperative that you return to your primary care physician (or establish a relationship with a primary care physician if you do not have one) for your post hospital discharge needs so that they can reassess your need for medications and monitor your lab values.  Please get a complete blood count and chemistry panel checked by your Primary MD at your next visit, and again as instructed by your Primary MD.  Get  Medicines reviewed and adjusted: Please take all your medications with you for your next visit with your Primary MD  Laboratory/radiological data: Please request your Primary MD to go over all hospital tests and procedure/radiological results at the follow up, please ask your primary care provider to get all Hospital records sent to his/her office.  In some cases, they will be blood work, cultures and biopsy results pending at the time of your discharge. Please request that your primary care provider follow up on these results.  If you are diabetic, please bring your blood sugar readings with you to your follow up appointment with primary care.    Please call and make your follow up appointments as soon as possible.    Also Note the following: If you experience worsening of your admission symptoms, develop shortness of breath, life threatening emergency, suicidal or homicidal thoughts you must seek medical attention immediately by calling 911 or calling your MD immediately  if symptoms less severe.  You must read complete instructions/literature along with all the possible adverse reactions/side effects for all the Medicines you take and that have been prescribed to you. Take any new Medicines after you have completely understood and accpet all the possible adverse reactions/side effects.   Do not drive when taking Pain medications or sleeping medications (Benzodiazepines)  Do not take more than prescribed Pain, Sleep and Anxiety Medications. It is not advisable to combine anxiety,sleep and pain medications without talking with your primary care practitioner  Special Instructions: If you have smoked or chewed Tobacco  in the last 2 yrs please stop smoking, stop any regular Alcohol  and or any Recreational drug use.  Wear Seat belts while driving.  Do not drive if taking any narcotic, mind altering or controlled substances or recreational drugs or alcohol.

## 2019-10-19 ENCOUNTER — Ambulatory Visit (HOSPITAL_COMMUNITY): Payer: Medicare Other | Admitting: Hematology

## 2019-10-26 ENCOUNTER — Encounter (HOSPITAL_COMMUNITY): Payer: Self-pay | Admitting: Hematology

## 2019-10-26 ENCOUNTER — Inpatient Hospital Stay (HOSPITAL_COMMUNITY): Payer: Medicare Other | Attending: Hematology | Admitting: Hematology

## 2019-10-26 ENCOUNTER — Inpatient Hospital Stay (HOSPITAL_COMMUNITY): Payer: Medicare Other

## 2019-10-26 ENCOUNTER — Other Ambulatory Visit: Payer: Self-pay

## 2019-10-26 DIAGNOSIS — I2699 Other pulmonary embolism without acute cor pulmonale: Secondary | ICD-10-CM | POA: Diagnosis not present

## 2019-10-26 DIAGNOSIS — D696 Thrombocytopenia, unspecified: Secondary | ICD-10-CM

## 2019-10-26 DIAGNOSIS — D631 Anemia in chronic kidney disease: Secondary | ICD-10-CM | POA: Diagnosis not present

## 2019-10-26 DIAGNOSIS — Z7901 Long term (current) use of anticoagulants: Secondary | ICD-10-CM | POA: Insufficient documentation

## 2019-10-26 DIAGNOSIS — N189 Chronic kidney disease, unspecified: Secondary | ICD-10-CM | POA: Diagnosis not present

## 2019-10-26 LAB — CBC WITH DIFFERENTIAL/PLATELET
Abs Immature Granulocytes: 0.01 10*3/uL (ref 0.00–0.07)
Basophils Absolute: 0 10*3/uL (ref 0.0–0.1)
Basophils Relative: 1 %
Eosinophils Absolute: 0.1 10*3/uL (ref 0.0–0.5)
Eosinophils Relative: 2 %
HCT: 42.2 % (ref 39.0–52.0)
Hemoglobin: 13.7 g/dL (ref 13.0–17.0)
Immature Granulocytes: 0 %
Lymphocytes Relative: 31 %
Lymphs Abs: 1.5 10*3/uL (ref 0.7–4.0)
MCH: 35.5 pg — ABNORMAL HIGH (ref 26.0–34.0)
MCHC: 32.5 g/dL (ref 30.0–36.0)
MCV: 109.3 fL — ABNORMAL HIGH (ref 80.0–100.0)
Monocytes Absolute: 0.6 10*3/uL (ref 0.1–1.0)
Monocytes Relative: 12 %
Neutro Abs: 2.6 10*3/uL (ref 1.7–7.7)
Neutrophils Relative %: 54 %
Platelets: 198 10*3/uL (ref 150–400)
RBC: 3.86 MIL/uL — ABNORMAL LOW (ref 4.22–5.81)
RDW: 14.6 % (ref 11.5–15.5)
WBC: 4.7 10*3/uL (ref 4.0–10.5)
nRBC: 0 % (ref 0.0–0.2)

## 2019-10-26 LAB — COMPREHENSIVE METABOLIC PANEL
ALT: 17 U/L (ref 0–44)
AST: 22 U/L (ref 15–41)
Albumin: 3.6 g/dL (ref 3.5–5.0)
Alkaline Phosphatase: 118 U/L (ref 38–126)
Anion gap: 8 (ref 5–15)
BUN: 8 mg/dL (ref 8–23)
CO2: 27 mmol/L (ref 22–32)
Calcium: 8.8 mg/dL — ABNORMAL LOW (ref 8.9–10.3)
Chloride: 102 mmol/L (ref 98–111)
Creatinine, Ser: 1.24 mg/dL (ref 0.61–1.24)
GFR calc Af Amer: >60 mL/min (ref >60)
GFR calc non Af Amer: 57 mL/min — ABNORMAL LOW (ref >60)
Glucose, Bld: 93 mg/dL (ref 70–99)
Potassium: 3.4 mmol/L — ABNORMAL LOW (ref 3.5–5.1)
Sodium: 137 mmol/L (ref 135–145)
Total Bilirubin: 1.2 mg/dL (ref 0.3–1.2)
Total Protein: 6.9 g/dL (ref 6.5–8.1)

## 2019-10-26 LAB — FERRITIN: Ferritin: 156 ng/mL (ref 24–336)

## 2019-10-26 LAB — VITAMIN B12: Vitamin B-12: 436 pg/mL (ref 180–914)

## 2019-10-26 LAB — LACTATE DEHYDROGENASE: LDH: 131 U/L (ref 98–192)

## 2019-10-26 LAB — SEDIMENTATION RATE: Sed Rate: 3 mm/hr (ref 0–16)

## 2019-10-26 LAB — FOLATE: Folate: 9.1 ng/mL (ref >5.9)

## 2019-10-26 LAB — IRON AND TIBC
Iron: 140 ug/dL (ref 45–182)
Saturation Ratios: 46 % — ABNORMAL HIGH (ref 17.9–39.5)
TIBC: 306 ug/dL (ref 250–450)
UIBC: 166 ug/dL

## 2019-10-26 LAB — HEPATITIS C ANTIBODY: HCV Ab: NONREACTIVE

## 2019-10-26 LAB — HEPATITIS B SURFACE ANTIBODY,QUALITATIVE: Hep B S Ab: NONREACTIVE

## 2019-10-26 LAB — HEPATITIS B SURFACE ANTIGEN: Hepatitis B Surface Ag: NONREACTIVE

## 2019-10-26 LAB — HEPATITIS B CORE ANTIBODY, TOTAL: Hep B Core Total Ab: NONREACTIVE

## 2019-10-26 NOTE — Patient Instructions (Addendum)
[place] at [facility] Discharge Instructions  You were seen today by Dr. Delton Coombes. He went over your history, family history and how you've been feeling lately. He will have you do blood work today. He will see you back in 3 weeks for follow up.   Thank you for choosing [place] at [facility] to provide your oncology and hematology care.  To afford each patient quality time with our provider, please arrive at least 15 minutes before your scheduled appointment time.   If you have a lab appointment with the [facility] please come in thru the  Main Entrance and check in at the main information desk  You need to re-schedule your appointment should you arrive 10 or more minutes late.  We strive to give you quality time with our providers, and arriving late affects you and other patients whose appointments are after yours.  Also, if you no show three or more times for appointments you may be dismissed from the clinic at the providers discretion.     Again, thank you for choosing [facility].  Our hope is that these requests will decrease the amount of time that you wait before being seen by our physicians.       _____________________________________________________________  Should you have questions after your visit to [facility], please contact our office at [phone] between the hours of 8:00 a.m. and 4:30 p.m.  Voicemails left after 4:00 p.m. will not be returned until the following business day.  For prescription refill requests, have your pharmacy contact our office and allow 72 hours.    Cancer Center Support Programs:   > Cancer Support Group  2nd Tuesday of the month 1pm-2pm, Journey Room

## 2019-10-26 NOTE — Assessment & Plan Note (Addendum)
1.  Moderate thrombocytopenia: -He has moderate thrombocytopenia with platelet count ranging from 81-115 since November 2019. -Also had mild macrocytic anemia during the same time with MCV around 109. -CT of the abdomen and pelvis on 09/19/2019 showed normal spleen and liver. -We will repeat platelet count in blue top tube.  Will review smear.  Will check for nutritional deficiencies and infectious etiology. -Differential diagnosis includes MDS versus immune mediated thrombocytopenia. -We will see him back in 3 weeks to discuss the results.  2.  Unprovoked bilateral pulmonary embolism: -He was admitted to the hospital from 10/15/2019 through 10/17/2019 with hemoptysis. -CT chest on 10/16/2019 showed acute bilateral pulmonary emboli greater within the left lower lobe.  No evidence of right heart strain. -Dopplers on 10/16/2019 shows no evidence of DVT. -He did not have any risk factors for the clot.  He is currently on Eliquis. -Based on the unprovoked nature of the pulmonary embolism, he will require long-term anticoagulation.  However he is at high risk for falls and bleeding.  We will continue to evaluate the risk-benefit ratio. -I would not recommend any hypercoagulable testing at this time.  3.  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  We will check for nutritional deficiencies. -He also has mild degree of CKD contributing to his anemia. -Last colonoscopy was close to 15 years ago and was normal.  Denies any bleeding per rectum or melena. -Differential diagnosis includes myelodysplastic syndrome.

## 2019-10-26 NOTE — Progress Notes (Signed)
CONSULT NOTE  Patient Care Team: Celene Squibb, MD as PCP - General (Internal Medicine)  CHIEF COMPLAINTS/PURPOSE OF CONSULTATION:  Moderate thrombocytopenia and a pulmonary embolism which is unprovoked.  HISTORY OF PRESENTING ILLNESS:  Brian Stark 73 y.o. male is seen in consultation today at the request of Dr. Nevada Crane for further work-up and management of moderate thrombocytopenia and recent pulmonary embolism.  This patient was admitted to the hospital on 10/15/2019 with hemoptysis.  A CT scan of the chest showed bilateral pulmonary emboli, greater within the left lower lobe.  Lower extremity DVT was negative for blood clot.  He was started on Eliquis.  He lives at home by himself.  He is independent of all ADLs and IADLs.  He denies any immobilization or major surgery prior to this episode of embolism.  Denies any bleeding per rectum or melena since Eliquis was started.  Denies any prior history of DVT or PE.  Reportedly fell a year ago and had surgery done on the skull and had physically slowed down since then.  Denies any shortness of breath on.  He feels dizzy at times.  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  Appetite is 50%.  Energy levels are low.  He worked 27 years in [place].  No exposure to chemicals.  He is a current active smoker, 1 pack/day for the last 56 years.  He drinks beer 3 times a week.  Family history significant for maternal grandmother and maternal grandfather dying of cancer.  No family history of myelodysplasia or connective tissue disorders.  No family history of blood clots.  Denies any over-the-counter NSAID ingestion.  Last colonoscopy was 15 years ago and was reportedly normal.  Denies any pica.  He reportedly had a major abdominal surgery for gallstones in 1981 and is not sure if he received blood transfusion at that time.  He walks with the help of a cane at this time.   MEDICAL HISTORY:  Past Medical History:   Diagnosis Date  . Bursitis    l shoulder  . Cataract   . Elevated bilirubin   . H/O: rheumatic fever   . Kidney stones   . Migraines   . Pulmonary embolism ([place])   . Rheumatic fever   . Tendonitis    L shoulder    SURGICAL HISTORY: Past Surgical History:  Procedure Laterality Date  . ABDOMINAL SURGERY    . CATARACT EXTRACTION, BILATERAL    . CHOLECYSTECTOMY    . CRANIOTOMY Bilateral 09/06/2018   Procedure: BILATERAL CRANIOTOMIES FOR SUBDURAL HEMATOMA EVACUATION;  Surgeon: Erline Levine, MD;  Location: [facility];  Service: Neurosurgery;  Laterality: Bilateral;  . EYE SURGERY      SOCIAL HISTORY: Social History   Socioeconomic History  . Marital status: Widowed    Spouse name: Not on file  . Number of children: Not on file  . Years of education: Not on file  . Highest education level: Not on file  Occupational History  . Occupation: retired  Tobacco Use  . Smoking status: Current Every Day Smoker    Packs/day: 1.00    Years: 50.00    Pack years: 50.00    Types: Cigarettes  . Smokeless tobacco: Never Used  Substance and Sexual Activity  . Alcohol use: Yes    Comment: beer every 2-3 days  . Drug use: Never  . Sexual activity: Not Currently  Other Topics Concern  . Not on file  Social History Narrative  . Not on file   Social Determinants of Health   Financial Resource Strain:   . Difficulty of Paying Living Expenses: Not on file  Food Insecurity:   . Worried About Programme researcher, broadcasting/film/video in the Last Year: Not on file  . Ran Out of Food in the Last Year: Not on file  Transportation Needs:   . Lack of Transportation (Medical): Not on file  . Lack of Transportation (Non-Medical): Not on file  Physical Activity:   . Days of Exercise per Week: Not on file  . Minutes of Exercise per Session: Not on file  Stress:   . Feeling of Stress : Not on file  Social Connections:   . Frequency of Communication with Friends and Family: Not on file  . Frequency of Social  Gatherings with Friends and Family: Not on file  . Attends Religious Services: Not on file  . Active Member of Clubs or Organizations: Not on file  . Attends Banker Meetings: Not on file  . Marital Status: Not on file  Intimate Partner Violence:   . Fear of Current or Ex-Partner: Not on file  . Emotionally Abused: Not on file  . Physically Abused: Not on file  . Sexually Abused: Not on file    FAMILY HISTORY: Family History  Problem Relation Age of Onset  . Atrial fibrillation Mother   . Macular degeneration Mother   . Atrial fibrillation Father   . Macular degeneration Father   . Diabetes Brother   . Seizures Brother   . Pancreatic cancer Brother   . Obesity Brother     ALLERGIES:  is allergic to flucloxacillin.  MEDICATIONS:  Current Outpatient Medications  Medication Sig Dispense Refill  . ondansetron (ZOFRAN) 4 MG tablet Take 4 mg by mouth every 6 (six) hours as needed for nausea or vomiting.    Marland Kitchen AIMOVIG 70 MG/ML SOAJ Inject 1 Syringe into the skin every 30 (thirty) days.    Marland Kitchen Apixaban Starter Pack (ELIQUIS DVT/PE STARTER PACK) 5 MG TBPK Take as directed on package: start with two-5mg  tablets twice daily for 7 days. On day 8, switch to one-5mg  tablet twice daily. 1 each 0  . cyclobenzaprine (FLEXERIL) 10 MG tablet Take 10 mg by mouth every 8 (eight) hours as needed.    Marland Kitchen HYDROcodone-acetaminophen (NORCO/VICODIN) 5-325 MG tablet Take 1 tablet by mouth every 6 (six) hours as needed.    Bertram Gala Glycol-Propyl Glycol 0.4-0.3 % SOLN Apply 1 drop to eye as needed.      No current facility-administered medications for this visit.    REVIEW OF SYSTEMS:   Constitutional: Denies fevers, chills or abnormal night sweats.  Positive for dizziness. Eyes: Denies blurriness of vision, double vision or watery eyes Ears, nose, mouth, throat, and face: Denies mucositis or sore throat Respiratory: Denies cough, dyspnea or wheezes Cardiovascular: Denies palpitation, chest  discomfort or lower extremity swelling Gastrointestinal: Positive for constipation and occasional nausea. Skin: Denies abnormal skin rashes Lymphatics: Denies new lymphadenopathy or easy bruising Neurological:Denies numbness, tingling or new weaknesses Behavioral/Psych: Mood is stable, no new changes  All other systems were reviewed with the patient and are negative.  PHYSICAL EXAMINATION: ECOG PERFORMANCE STATUS: 1 - Symptomatic but completely ambulatory  Vitals:   10/26/19 1255  BP: 125/81  Pulse: 87  Resp: 16  Temp: 97.9 F (36.6 C)  SpO2: 100%   Filed Weights   10/26/19 1255  Weight: 148 lb 1.6 oz (67.2 kg)  GENERAL:alert, no distress and comfortable SKIN: skin color, texture, turgor are normal, no rashes or significant lesions EYES: Right eye ptosis and miosis consistent with Horner syndrome. OROPHARYNX:no exudate, no erythema and lips, buccal mucosa, and tongue normal  NECK: supple, thyroid normal size, non-tender, without nodularity LYMPH:  no palpable lymphadenopathy in the cervical, axillary or inguinal LUNGS: clear to auscultation and percussion with normal breathing effort HEART: regular rate & rhythm and no murmurs and no lower extremity edema ABDOMEN:abdomen soft, non-tender and normal bowel sounds Musculoskeletal:no cyanosis of digits and no clubbing  PSYCH: alert & oriented x 3 with fluent speech NEURO: no focal motor/sensory deficits  LABORATORY DATA:  I have reviewed the data as listed Recent Results (from the past 2160 hour(s))  CBC with Differential     Status: Abnormal   Collection Time: 10/15/19  8:19 PM  Result Value Ref Range   WBC 11.6 (H) 4.0 - 10.5 K/uL   RBC 3.64 (L) 4.22 - 5.81 MIL/uL   Hemoglobin 13.0 13.0 - 17.0 g/dL   HCT 78.2 95.6 - 21.3 %   MCV 108.0 (H) 80.0 - 100.0 fL   MCH 35.7 (H) 26.0 - 34.0 pg   MCHC 33.1 30.0 - 36.0 g/dL   RDW 08.6 57.8 - 46.9 %   Platelets 80 (L) 150 - 400 K/uL    Comment: SPECIMEN CHECKED FOR  CLOTS CONSISTENT WITH PREVIOUS RESULT    nRBC 0.0 0.0 - 0.2 %   Neutrophils Relative % 83 %   Neutro Abs 9.6 (H) 1.7 - 7.7 K/uL   Lymphocytes Relative 4 %   Lymphs Abs 0.4 (L) 0.7 - 4.0 K/uL   Monocytes Relative 12 %   Monocytes Absolute 1.4 (H) 0.1 - 1.0 K/uL   Eosinophils Relative 0 %   Eosinophils Absolute 0.0 0.0 - 0.5 K/uL   Basophils Relative 0 %   Basophils Absolute 0.0 0.0 - 0.1 K/uL   Immature Granulocytes 1 %   Abs Immature Granulocytes 0.14 (H) 0.00 - 0.07 K/uL    Comment: Performed at [facility], [address], [place], [place] [address]  Basic metabolic panel     Status: Abnormal   Collection Time: 10/15/19  8:19 PM  Result Value Ref Range   Sodium 133 (L) 135 - 145 mmol/L   Potassium 3.5 3.5 - 5.1 mmol/L   Chloride 100 98 - 111 mmol/L   CO2 24 22 - 32 mmol/L   Glucose, Bld 113 (H) 70 - 99 mg/dL   BUN 17 8 - 23 mg/dL   Creatinine, Ser 8.41 (H) 0.61 - 1.24 mg/dL   Calcium 9.5 8.9 - 32.4 mg/dL   GFR calc non Af Amer 53 (L) >60 mL/min   GFR calc Af Amer >60 >60 mL/min   Anion gap 9 5 - 15    Comment: Performed at [facility], [address], [place], [place] [address]  D-dimer, quantitative (not at [facility])     Status: Abnormal   Collection Time: 10/15/19  8:19 PM  Result Value Ref Range   D-Dimer, Quant 2.70 (H) 0.00 - 0.50 ug/mL-FEU    Comment: (NOTE) At the manufacturer cut-off of 0.50 ug/mL FEU, this assay has been documented to exclude PE with a sensitivity and negative predictive value of 97 to 99%.  At this time, this assay has not been approved by the FDA to exclude DVT/VTE. Results should be correlated with clinical presentation. Performed at [facility], [address] [place], [place] [address]   SARS  CORONAVIRUS 2 (TAT 6-24 HRS) Nasopharyngeal Nasopharyngeal Swab     Status: None   Collection Time: 10/16/19  3:12 AM   Specimen: Nasopharyngeal Swab  Result Value Ref Range   SARS Coronavirus 2 NEGATIVE NEGATIVE    Comment: (NOTE) SARS-CoV-2 target  nucleic acids are NOT DETECTED. The SARS-CoV-2 RNA is generally detectable in upper and lower respiratory specimens during the acute phase of infection. Negative results do not preclude SARS-CoV-2 infection, do not rule out co-infections with other pathogens, and should not be used as the sole basis for treatment or other patient management decisions. Negative results must be combined with clinical observations, patient history, and epidemiological information. The expected result is Negative. Fact Sheet for Patients: HairSlick.no Fact Sheet for Healthcare Providers: quierodirigir.com This test is not yet approved or cleared by the [place] FDA and  has been authorized for detection and/or diagnosis of SARS-CoV-2 by FDA under an Emergency Use Authorization (EUA). This EUA will remain  in effect (meaning this test can be used) for the duration of the COVID-19 declaration under Section 56 4(b)(1) of the Act, 21 U.S.C. section 360bbb-3(b)(1), unless the authorization is terminated or revoked sooner. Performed at [facility], [address], [place], [place] [address]   CBC     Status: Abnormal   Collection Time: 10/16/19  4:43 AM  Result Value Ref Range   WBC 8.4 4.0 - 10.5 K/uL   RBC 3.43 (L) 4.22 - 5.81 MIL/uL   Hemoglobin 12.2 (L) 13.0 - 17.0 g/dL   HCT 60.4 (L) 54.0 - 98.1 %   MCV 107.0 (H) 80.0 - 100.0 fL   MCH 35.6 (H) 26.0 - 34.0 pg   MCHC 33.2 30.0 - 36.0 g/dL   RDW 19.1 47.8 - 29.5 %   Platelets 81 (L) 150 - 400 K/uL    Comment: SPECIMEN CHECKED FOR CLOTS CONSISTENT WITH PREVIOUS RESULT    nRBC 0.0 0.0 - 0.2 %    Comment: Performed at [facility], [address], [place], [place] [address]  Basic metabolic panel     Status: Abnormal   Collection Time: 10/16/19  7:21 AM  Result Value Ref Range   Sodium 139 135 - 145 mmol/L   Potassium 3.6 3.5 - 5.1 mmol/L   Chloride 104 98 - 111 mmol/L   CO2 26 22 - 32  mmol/L   Glucose, Bld 106 (H) 70 - 99 mg/dL   BUN 15 8 - 23 mg/dL   Creatinine, Ser 8.65 0.61 - 1.24 mg/dL   Calcium 9.2 8.9 - 78.4 mg/dL   GFR calc non Af Amer 58 (L) >60 mL/min   GFR calc Af Amer >60 >60 mL/min   Anion gap 9 5 - 15    Comment: Performed at [facility], [address], [place], [place] [address]  ECHOCARDIOGRAM COMPLETE     Status: None   Collection Time: 10/16/19 12:32 PM  Result Value Ref Range   Weight 2,320 oz   Height 70 in   BP 151/89 mmHg  CBC     Status: Abnormal   Collection Time: 10/17/19  6:12 AM  Result Value Ref Range   WBC 4.1 4.0 - 10.5 K/uL   RBC 3.18 (L) 4.22 - 5.81 MIL/uL   Hemoglobin 11.6 (L) 13.0 - 17.0 g/dL   HCT 52.8 (L) 41.3 - 24.4 %   MCV 109.1 (H) 80.0 - 100.0 fL   MCH 36.5 (H) 26.0 - 34.0 pg   MCHC 33.4 30.0 -  36.0 g/dL   RDW 92.1 19.4 - 17.4 %   Platelets 88 (L) 150 - 400 K/uL    Comment: SPECIMEN CHECKED FOR CLOTS CONSISTENT WITH PREVIOUS RESULT    nRBC 0.0 0.0 - 0.2 %    Comment: Performed at [facility], [address], [place], [place] [address]  Basic metabolic panel     Status: None   Collection Time: 10/17/19  6:12 AM  Result Value Ref Range   Sodium 141 135 - 145 mmol/L   Potassium 3.8 3.5 - 5.1 mmol/L   Chloride 104 98 - 111 mmol/L   CO2 30 22 - 32 mmol/L   Glucose, Bld 88 70 - 99 mg/dL   BUN 11 8 - 23 mg/dL   Creatinine, Ser 8.18 0.61 - 1.24 mg/dL   Calcium 9.3 8.9 - 56.3 mg/dL   GFR calc non Af Amer >60 >60 mL/min   GFR calc Af Amer >60 >60 mL/min   Anion gap 7 5 - 15    Comment: Performed at [facility], [address] [place], [place] [address]    RADIOGRAPHIC STUDIES: I have personally reviewed the radiological images as listed and agreed with the findings in the report. CTA Chest for PE  Result Date: 10/16/2019 CLINICAL DATA:  Congestion and shortness of breath. Hemoptysis EXAM: CT ANGIOGRAPHY CHEST WITH CONTRAST TECHNIQUE: Multidetector CT imaging of the chest was performed using the standard protocol  during bolus administration of intravenous contrast. Multiplanar CT image reconstructions and MIPs were obtained to evaluate the vascular anatomy. CONTRAST:  OMNIPAQUE IOHEXOL 350 MG/ML SOLN COMPARISON:  None. FINDINGS: Cardiovascular: Contrast injection is sufficient to demonstrate satisfactory opacification of the pulmonary arteries to the segmental level.There are acute bilateral pulmonary emboli, greatest within the left lower lobe where there are lobar, segmental, and subsegmental pulmonary emboli. There are segmental pulmonary emboli involving the right lower lobe and left upper lobe. The main pulmonary artery is within normal limits for size. There is no CT evidence of acute right heart strain. There are atherosclerotic changes of the visualized thoracic aorta without evidence for dissection. Heart size is normal, without pericardial effusion. Mediastinum/Nodes: --No mediastinal or hilar lymphadenopathy. --No axillary lymphadenopathy. --No supraclavicular lymphadenopathy. --Normal thyroid gland. --the esophagus is fluid-filled to the level of the upper thorax. Lungs/Pleura: There is no pneumothorax. The trachea is unremarkable. There are hazy airspace opacities at the left lung base which may represent developing pulmonary infarcts in the setting of acute pulmonary emboli. There is no significant pleural effusion. Upper Abdomen: The patient is status post cholecystectomy. There is significant intrahepatic biliary ductal dilatation involving the left hepatic lobe. Musculoskeletal: No chest wall abnormality. No acute or significant osseous findings. Review of the MIP images confirms the above findings. IMPRESSION: 1. Acute bilateral pulmonary emboli as detailed above, greatest within the left lower lobe. There is no CT evidence for right heart strain. 2. Hazy airspace opacities at the left lung base which may represent developing pulmonary infarcts in the setting of acute pulmonary emboli. However, a  developing infectious process cannot be excluded. 3. The esophagus is fluid-filled to the level of the upper thorax. This places the patient at risk for aspiration. Aortic Atherosclerosis (ICD10-I70.0). These results were called by telephone at the time of interpretation on 10/16/2019 at 2:10 am to provider [facility] , who verbally acknowledged these results. Electronically Signed   By: Katherine Mantle M.D.   On: 10/16/2019 02:14   US Venous Img Lower Bilateral  Result Date: 10/16/2019  CLINICAL DATA:  Pulmonary embolus. EXAM: BILATERAL LOWER EXTREMITY VENOUS DOPPLER ULTRASOUND TECHNIQUE: Gray-scale sonography with graded compression, as well as color Doppler and duplex ultrasound were performed to evaluate the lower extremity deep venous systems from the level of the common femoral vein and including the common femoral, femoral, profunda femoral, popliteal and calf veins including the posterior tibial, peroneal and gastrocnemius veins when visible. The superficial great saphenous vein was also interrogated. Spectral Doppler was utilized to evaluate flow at rest and with distal augmentation maneuvers in the common femoral, femoral and popliteal veins. COMPARISON:  None. FINDINGS: RIGHT LOWER EXTREMITY Common Femoral Vein: No evidence of thrombus. Normal compressibility, respiratory phasicity and response to augmentation. Saphenofemoral Junction: No evidence of thrombus. Normal compressibility and flow on color Doppler imaging. Profunda Femoral Vein: No evidence of thrombus. Normal compressibility and flow on color Doppler imaging. Femoral Vein: No evidence of thrombus. Normal compressibility, respiratory phasicity and response to augmentation. Popliteal Vein: No evidence of thrombus. Normal compressibility, respiratory phasicity and response to augmentation. Calf Veins: No evidence of thrombus. Normal compressibility and flow on color Doppler imaging. Superficial Great Saphenous Vein: No evidence of  thrombus. Normal compressibility. Venous Reflux:  None. Other Findings:  None. LEFT LOWER EXTREMITY Common Femoral Vein: No evidence of thrombus. Normal compressibility, respiratory phasicity and response to augmentation. Saphenofemoral Junction: No evidence of thrombus. Normal compressibility and flow on color Doppler imaging. Profunda Femoral Vein: No evidence of thrombus. Normal compressibility and flow on color Doppler imaging. Femoral Vein: No evidence of thrombus. Normal compressibility, respiratory phasicity and response to augmentation. Popliteal Vein: No evidence of thrombus. Normal compressibility, respiratory phasicity and response to augmentation. Calf Veins: No evidence of thrombus. Normal compressibility and flow on color Doppler imaging. Superficial Great Saphenous Vein: No evidence of thrombus. Normal compressibility. Venous Reflux:  None. Other Findings:  None. IMPRESSION: No evidence of deep venous thrombosis in either lower extremity. Electronically Signed   By: Lupita RaiderJames  Green Jr M.D.   On: 10/16/2019 11:37   ECHOCARDIOGRAM COMPLETE  Result Date: 10/16/2019   ECHOCARDIOGRAM REPORT   Patient Name:   Enis GashRALPH G Haran Date of Exam: 10/16/2019 Medical Rec #:  [id number]       Height:       70.0 in Accession #:    [id number]      Weight:       145.0 lb Date of Birth:  07/09/1946       BSA:          1.82 m Patient Age:    73 years        BP:           151/89 mmHg Patient Gender: M               HR:           110 bpm. [facility] Procedure: 2D Echo Indications:    Pulmonary Embolus 415.19 / I26.99  History:        Patient has no prior history of Echocardiogram examinations.                 Risk Factors:Current Smoker. PE (pulmonary thromboembolism,                 Subdural hematoma ,Subdural bleeding , Rheumatic fever.  Sonographer:    Jeryl ColumbiaJohanna Elliott RDCS (AE) Referring Phys: [id number] GAGAN S LAMA IMPRESSIONS  1. Left ventricular ejection fraction, by visual estimation, is 60 to 65%. The  left ventricle has normal  function. There is no left ventricular hypertrophy.  2. Left ventricular diastolic parameters are indeterminate.  3. The left ventricle has no regional wall motion abnormalities.  4. Global right ventricle has normal systolic function.The right ventricular size is normal. No increase in right ventricular wall thickness.  5. Left atrial size was normal.  6. Right atrial size was normal.  7. The mitral valve is normal in structure. No evidence of mitral valve regurgitation. No evidence of mitral stenosis.  8. The tricuspid valve is normal in structure. Tricuspid valve regurgitation is not demonstrated.  9. The aortic valve is normal in structure. Aortic valve regurgitation is not visualized. No evidence of aortic valve sclerosis or stenosis. 10. The pulmonic valve was not well visualized. Pulmonic valve regurgitation is not visualized. 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 12. Aortic dilatation noted. 13. There is mild dilatation of the aortic root. 14. The inferior vena cava is normal in size with greater than 50% respiratory variability, suggesting right atrial pressure of 3 mmHg. 15. The interatrial septum was not well visualized. FINDINGS  Left Ventricle: Left ventricular ejection fraction, by visual estimation, is 60 to 65%. The left ventricle has normal function. The left ventricle has no regional wall motion abnormalities. There is no left ventricular hypertrophy. Left ventricular diastolic parameters are indeterminate. Right Ventricle: The right ventricular size is normal. No increase in right ventricular wall thickness. Global RV systolic function is has normal systolic function. Left Atrium: Left atrial size was normal in size. Right Atrium: Right atrial size was normal in size Pericardium: There is no evidence of pericardial effusion. Mitral Valve: The mitral valve is normal in structure. No evidence of mitral valve regurgitation. No evidence of mitral valve stenosis by observation.  Tricuspid Valve: The tricuspid valve is normal in structure. Tricuspid valve regurgitation is not demonstrated. Aortic Valve: The aortic valve is normal in structure. Aortic valve regurgitation is not visualized. The aortic valve is structurally normal, with no evidence of sclerosis or stenosis. Aortic valve mean gradient measures 2.0 mmHg. Aortic valve peak gradient measures 3.3 mmHg. Aortic valve area, by VTI measures 3.51 cm. Pulmonic Valve: The pulmonic valve was not well visualized. Pulmonic valve regurgitation is not visualized. Pulmonic regurgitation is not visualized. No evidence of pulmonic stenosis. Aorta: Aortic dilatation noted. There is mild dilatation of the aortic root. 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 Venous: The inferior vena cava is normal in size with greater than 50% respiratory variability, suggesting right atrial pressure of 3 mmHg. IAS/Shunts: The interatrial septum was not well visualized.  LEFT VENTRICLE PLAX 2D LVIDd:         4.09 cm  Diastology LVIDs:         2.29 cm  LV e' lateral:   8.38 cm/s LV PW:         0.98 cm  LV E/e' lateral: 6.9 LV IVS:        0.77 cm  LV e' medial:    6.74 cm/s LVOT diam:     2.30 cm  LV E/e' medial:  8.6 LV SV:         56 ml LV SV Index:   31.09 LVOT Area:     4.15 cm  RIGHT VENTRICLE RV S prime:     13.60 cm/s TAPSE (M-mode): 2.2 cm LEFT ATRIUM             Index       RIGHT ATRIUM  Index LA diam:        2.20 cm 1.21 cm/m  RA Area:     8.66 cm LA Vol (A2C):   42.4 ml 23.29 ml/m RA Volume:   18.00 ml 9.89 ml/m LA Vol (A4C):   18.2 ml 10.00 ml/m LA Biplane Vol: 29.5 ml 16.20 ml/m  AORTIC VALVE AV Area (Vmax):    3.65 cm AV Area (Vmean):   2.99 cm AV Area (VTI):     3.51 cm AV Vmax:           91.22 cm/s AV Vmean:          67.919 cm/s AV VTI:            0.179 m AV Peak Grad:      3.3 mmHg AV Mean Grad:      2.0 mmHg LVOT Vmax:         80.03 cm/s LVOT Vmean:        48.872 cm/s LVOT VTI:          0.151 m LVOT/AV VTI ratio: 0.84   AORTA Ao Root diam: 3.50 cm MITRAL VALVE MV Area (PHT): 2.66 cm             SHUNTS MV PHT:        82.65 msec           Systemic VTI:  0.15 m MV Decel Time: 285 msec             Systemic Diam: 2.30 cm MV E velocity: 57.70 cm/s 103 cm/s MV A velocity: 62.60 cm/s 70.3 cm/s MV E/A ratio:  0.92       1.5  Dina Rich MD Electronically signed by Dina Rich MD Signature Date/Time: 10/16/2019/2:49:20 PM    Final     ASSESSMENT & PLAN:  Thrombocytopenia (HCC) 1.  Moderate thrombocytopenia: -He has moderate thrombocytopenia with platelet count ranging from 81-115 since November 2019. -Also had mild macrocytic anemia during the same time with MCV around 109. -CT of the abdomen and pelvis on 09/19/2019 showed normal spleen and liver. -We will repeat platelet count in blue top tube.  Will review smear.  Will check for nutritional deficiencies and infectious etiology. -Differential diagnosis includes MDS versus immune mediated thrombocytopenia. -We will see him back in 3 weeks to discuss the results.  2.  Unprovoked bilateral pulmonary embolism: -He was admitted to the hospital from 10/15/2019 through 10/17/2019 with hemoptysis. -CT chest on 10/16/2019 showed acute bilateral pulmonary emboli greater within the left lower lobe.  No evidence of right heart strain. -Dopplers on 10/16/2019 shows no evidence of DVT. -He did not have any risk factors for the clot.  He is currently on Eliquis. -Based on the unprovoked nature of the pulmonary embolism, he will require long-term anticoagulation.  However he is at high risk for falls and bleeding.  We will continue to evaluate the risk-benefit ratio. -I would not recommend any hypercoagulable testing at this time.  3.  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  We will check for nutritional deficiencies. -He also has mild degree of CKD contributing to his anemia. -Last colonoscopy was close to 15 years ago and was normal.  Denies any  bleeding per rectum or melena. -Differential diagnosis includes myelodysplastic syndrome.   All questions were answered. The patient knows to call the clinic with any problems, questions or concerns.      Doreatha Massed, MD 10/26/19 2:00 PM

## 2019-10-30 LAB — COPPER, SERUM: Copper: 115 ug/dL (ref 72–166)

## 2019-10-31 LAB — PROTEIN ELECTROPHORESIS, SERUM
A/G Ratio: 1.2 (ref 0.7–1.7)
Albumin ELP: 3.4 g/dL (ref 2.9–4.4)
Alpha-1-Globulin: 0.2 g/dL (ref 0.0–0.4)
Alpha-2-Globulin: 0.5 g/dL (ref 0.4–1.0)
Beta Globulin: 0.9 g/dL (ref 0.7–1.3)
Gamma Globulin: 1.2 g/dL (ref 0.4–1.8)
Globulin, Total: 2.9 g/dL (ref 2.2–3.9)
Total Protein ELP: 6.3 g/dL (ref 6.0–8.5)

## 2019-10-31 LAB — PATHOLOGIST SMEAR REVIEW

## 2019-11-01 LAB — METHYLMALONIC ACID, SERUM: Methylmalonic Acid, Quantitative: 337 nmol/L (ref 0–378)

## 2019-11-16 ENCOUNTER — Encounter (HOSPITAL_COMMUNITY): Payer: Self-pay | Admitting: Hematology

## 2019-11-16 ENCOUNTER — Other Ambulatory Visit: Payer: Self-pay

## 2019-11-16 ENCOUNTER — Inpatient Hospital Stay (HOSPITAL_COMMUNITY): Payer: Medicare Other | Attending: Hematology | Admitting: Hematology

## 2019-11-16 VITALS — BP 136/81 | HR 95 | Temp 97.6°F | Resp 18 | Wt 146.9 lb

## 2019-11-16 DIAGNOSIS — K5900 Constipation, unspecified: Secondary | ICD-10-CM | POA: Insufficient documentation

## 2019-11-16 DIAGNOSIS — I2699 Other pulmonary embolism without acute cor pulmonale: Secondary | ICD-10-CM | POA: Insufficient documentation

## 2019-11-16 DIAGNOSIS — Z7901 Long term (current) use of anticoagulants: Secondary | ICD-10-CM | POA: Diagnosis not present

## 2019-11-16 DIAGNOSIS — D696 Thrombocytopenia, unspecified: Secondary | ICD-10-CM

## 2019-11-16 NOTE — Progress Notes (Signed)
[facility] [address] [place] [address]   CLINIC:  Medical Oncology/Hematology  PCP:  Benita Stabile, MD [address] [name] [address] [phone]   REASON FOR VISIT:  Follow-up for thrombocytopenia and pulmonary embolism.  CURRENT THERAPY: Observation.   INTERVAL HISTORY:  Brian Stark 74 y.o. male seen for follow-up of moderate thrombocytopenia.  We have checked his blood counts at last visit to find out the etiology of thrombocytopenia.  He is taking Eliquis without any bleeding.  Denies any bruising.  Appetite is 100%.  Energy levels are 75%.  Reports some constipation and headaches which are chronic and stable.    REVIEW OF SYSTEMS:  Review of Systems  Gastrointestinal: Positive for constipation.  Neurological: Positive for headaches.  All other systems reviewed and are negative.    PAST MEDICAL/SURGICAL HISTORY:  Past Medical History:  Diagnosis Date  . Bursitis    l shoulder  . Cataract   . Elevated bilirubin   . H/O: rheumatic fever   . Kidney stones   . Migraines   . Pulmonary embolism (HCC)   . Rheumatic fever   . Tendonitis    L shoulder   Past Surgical History:  Procedure Laterality Date  . ABDOMINAL SURGERY    . CATARACT EXTRACTION, BILATERAL    . CHOLECYSTECTOMY    . CRANIOTOMY Bilateral 09/06/2018   Procedure: BILATERAL CRANIOTOMIES FOR SUBDURAL HEMATOMA EVACUATION;  Surgeon: Maeola Harman, MD;  Location: [facility];  Service: Neurosurgery;  Laterality: Bilateral;  . EYE SURGERY       SOCIAL HISTORY:  Social History   Socioeconomic History  . Marital status: Widowed    Spouse name: Not on file  . Number of children: Not on file  . Years of education: Not on file  . Highest education level: Not on file  Occupational History  . Occupation: retired  Tobacco Use  . Smoking status: Current Every Day Smoker    Packs/day: 1.00    Years: 50.00    Pack years: 50.00    Types: Cigarettes  . Smokeless tobacco:  Never Used  Substance and Sexual Activity  . Alcohol use: Yes    Comment: beer every 2-3 days  . Drug use: Never  . Sexual activity: Not Currently  Other Topics Concern  . Not on file  Social History Narrative  . Not on file   Social Determinants of Health   Financial Resource Strain:   . Difficulty of Paying Living Expenses: Not on file  Food Insecurity:   . Worried About Programme researcher, broadcasting/film/video in the Last Year: Not on file  . Ran Out of Food in the Last Year: Not on file  Transportation Needs:   . Lack of Transportation (Medical): Not on file  . Lack of Transportation (Non-Medical): Not on file  Physical Activity:   . Days of Exercise per Week: Not on file  . Minutes of Exercise per Session: Not on file  Stress:   . Feeling of Stress : Not on file  Social Connections:   . Frequency of Communication with Friends and Family: Not on file  . Frequency of Social Gatherings with Friends and Family: Not on file  . Attends Religious Services: Not on file  . Active Member of Clubs or Organizations: Not on file  . Attends Banker Meetings: Not on file  . Marital Status: Not on file  Intimate Partner Violence:   . Fear of Current or  Ex-Partner: Not on file  . Emotionally Abused: Not on file  . Physically Abused: Not on file  . Sexually Abused: Not on file    FAMILY HISTORY:  Family History  Problem Relation Age of Onset  . Atrial fibrillation Mother   . Macular degeneration Mother   . Atrial fibrillation Father   . Macular degeneration Father   . Diabetes Brother   . Seizures Brother   . Pancreatic cancer Brother   . Obesity Brother     CURRENT MEDICATIONS:  Outpatient Encounter Medications as of 11/16/2019  Medication Sig  . AIMOVIG 70 MG/ML SOAJ Inject 1 Syringe into the skin every 30 (thirty) days.  Marland Kitchen apixaban (ELIQUIS) 5 MG TABS tablet Take 5 mg by mouth 2 (two) times daily.  . cyclobenzaprine (FLEXERIL) 10 MG tablet Take 10 mg by mouth every 8 (eight)  hours as needed.  Marland Kitchen HYDROcodone-acetaminophen (NORCO/VICODIN) 5-325 MG tablet Take 1 tablet by mouth every 6 (six) hours as needed.  . ondansetron (ZOFRAN) 4 MG tablet Take 4 mg by mouth every 6 (six) hours as needed for nausea or vomiting.  Bertram Gala Glycol-Propyl Glycol 0.4-0.3 % SOLN Apply 1 drop to eye as needed.   . [DISCONTINUED] Apixaban Starter Pack (ELIQUIS DVT/PE STARTER PACK) 5 MG TBPK Take as directed on package: start with two-5mg  tablets twice daily for 7 days. On day 8, switch to one-5mg  tablet twice daily.   No facility-administered encounter medications on file as of 11/16/2019.    ALLERGIES:  Allergies  Allergen Reactions  . Flucloxacillin Anaphylaxis     PHYSICAL EXAM:  ECOG Performance status: 1  Vitals:   11/16/19 1602  BP: 136/81  Pulse: 95  Resp: 18  Temp: 97.6 F (36.4 C)  SpO2: 96%   Filed Weights   11/16/19 1602  Weight: 146 lb 14.4 oz (66.6 kg)    Physical Exam Vitals reviewed.  Constitutional:      Appearance: Normal appearance.  Cardiovascular:     Rate and Rhythm: Normal rate and regular rhythm.     Heart sounds: Normal heart sounds.  Pulmonary:     Effort: Pulmonary effort is normal.     Breath sounds: Normal breath sounds.  Abdominal:     General: There is no distension.     Palpations: Abdomen is soft. There is no mass.  Skin:    General: Skin is warm.  Neurological:     General: No focal deficit present.     Mental Status: He is alert and oriented to person, place, and time.  Psychiatric:        Mood and Affect: Mood normal.        Behavior: Behavior normal.      LABORATORY DATA:  I have reviewed the labs as listed.  CBC    Component Value Date/Time   WBC 4.7 10/26/2019 1400   RBC 3.86 (L) 10/26/2019 1400   HGB 13.7 10/26/2019 1400   HCT 42.2 10/26/2019 1400   PLT 198 10/26/2019 1400   MCV 109.3 (H) 10/26/2019 1400   MCH 35.5 (H) 10/26/2019 1400   MCHC 32.5 10/26/2019 1400   RDW 14.6 10/26/2019 1400   LYMPHSABS  1.5 10/26/2019 1400   MONOABS 0.6 10/26/2019 1400   EOSABS 0.1 10/26/2019 1400   BASOSABS 0.0 10/26/2019 1400   CMP Latest Ref Rng & Units 10/26/2019 10/17/2019 10/16/2019  Glucose 70 - 99 mg/dL 93 88 945(W)  BUN 8 - 23 mg/dL 8 11 15   Creatinine 0.61 - 1.24  mg/dL 1.24 1.17 1.23  Sodium 135 - 145 mmol/L 137 141 139  Potassium 3.5 - 5.1 mmol/L 3.4(L) 3.8 3.6  Chloride 98 - 111 mmol/L 102 104 104  CO2 22 - 32 mmol/L 27 30 26   Calcium 8.9 - 10.3 mg/dL 8.8(L) 9.3 9.2  Total Protein 6.5 - 8.1 g/dL 6.9 - -  Total Bilirubin 0.3 - 1.2 mg/dL 1.2 - -  Alkaline Phos 38 - 126 U/L 118 - -  AST 15 - 41 U/L 22 - -  ALT 0 - 44 U/L 17 - -       DIAGNOSTIC IMAGING:  I have independently reviewed the scans and discussed with the patient.     ASSESSMENT & PLAN:   Thrombocytopenia ([place]) 1.  Moderate thrombocytopenia: -He has moderate thrombocytopenia with platelet count ranging from 81-115 since November 2019. -Also had mild macrocytic anemia during the same time with MCV around 109. -CT of the abdomen and pelvis on 09/19/2019 showed normal spleen and liver. -We reviewed his blood work from 10/26/2019.  Platelet count has normalized at 198.  Y69, folic acid and copper levels were normal.  SPEP is negative.  Hepatitis panel is also negative. -I have recommended follow-up in 4 months with repeat labs.  2.  Unprovoked bilateral pulmonary embolism: -He was admitted to the hospital from 10/15/2019 through 10/17/2019 with hemoptysis. -CT chest on 10/16/2019 showed acute bilateral pulmonary emboli greater within the left lower lobe.  No evidence of right heart strain. -Dopplers on 10/16/2019 shows no evidence of DVT. -He did not have any risk factors for the clot.  He is currently on Eliquis. -Based on the unprovoked nature of the pulmonary embolism, he will require long-term anticoagulation.  However he is at high risk for falls and bleeding.  We will continue to evaluate the risk-benefit ratio. -I  would not recommend any hypercoagulable testing at this time.  3.  Macrocytosis: -His latest hemoglobin is normal.  However his MCV was high. -Folic acid and S85 are normal.  Could be from early myelodysplasia.      Orders placed this encounter:  Orders Placed This Encounter  Procedures  . CBC with Differential  . Lactate dehydrogenase      Derek Jack, MD [place] [phone]

## 2019-11-16 NOTE — Assessment & Plan Note (Signed)
1.  Moderate thrombocytopenia: -He has moderate thrombocytopenia with platelet count ranging from 81-115 since November 2019. -Also had mild macrocytic anemia during the same time with MCV around 109. -CT of the abdomen and pelvis on 09/19/2019 showed normal spleen and liver. -We reviewed his blood work from 10/26/2019.  Platelet count has normalized at 198.  B12, folic acid and copper levels were normal.  SPEP is negative.  Hepatitis panel is also negative. -I have recommended follow-up in 4 months with repeat labs.  2.  Unprovoked bilateral pulmonary embolism: -He was admitted to the hospital from 10/15/2019 through 10/17/2019 with hemoptysis. -CT chest on 10/16/2019 showed acute bilateral pulmonary emboli greater within the left lower lobe.  No evidence of right heart strain. -Dopplers on 10/16/2019 shows no evidence of DVT. -He did not have any risk factors for the clot.  He is currently on Eliquis. -Based on the unprovoked nature of the pulmonary embolism, he will require long-term anticoagulation.  However he is at high risk for falls and bleeding.  We will continue to evaluate the risk-benefit ratio. -I would not recommend any hypercoagulable testing at this time.  3.  Macrocytosis: -His latest hemoglobin is normal.  However his MCV was high. -Folic acid and B12 are normal.  Could be from early myelodysplasia.

## 2019-11-16 NOTE — Patient Instructions (Signed)
[facility] Discharge Instructions  You were seen today by Dr. Katragadda. He went over your recent lab results. He will see you back in 4 months for labs and follow up.   Thank you for choosing  Can[facility] Center at [facility] to provide your oncology and hematology care.  To afford each patient quality time with our provider, please arrive at least 15 minutes before your scheduled appointment time.   If you have a lab appointment with the Cancer Center please come in thru the  Main Entrance and check in at the main information desk  You need to re-schedule your appointment should you arrive 10 or more minutes late.  We strive to give you quality time with our providers, and arriving late affects you and other patients whose appointments are after yours.  Also, if you no show three or more times for appointments you may be dismissed from the clinic at the providers discretion.     Again, thank you for choosing [facility].  Our hope is that these requests will decrease the amount of time that you wait before being seen by our physicians.       _____________________________________________________________  Should you have questions after your visit to [facility], please contact our office at [phone] between the hours of 8:00 a.m. and 4:30 p.m.  Voicemails left after 4:00 p.m. will not be returned until the following business day.  For prescription refill requests, have your pharmacy contact our office and allow 72 hours.    Cancer Center Support Programs:   > Cancer Support Group  2nd Tuesday of the month 1pm-2pm, Journey Room    

## 2020-03-16 ENCOUNTER — Other Ambulatory Visit (HOSPITAL_COMMUNITY): Payer: Medicare Other

## 2020-03-16 ENCOUNTER — Ambulatory Visit (HOSPITAL_COMMUNITY): Payer: Medicare Other | Admitting: Nurse Practitioner

## 2020-03-23 ENCOUNTER — Inpatient Hospital Stay (HOSPITAL_BASED_OUTPATIENT_CLINIC_OR_DEPARTMENT_OTHER): Payer: Medicare Other | Admitting: Nurse Practitioner

## 2020-03-23 ENCOUNTER — Other Ambulatory Visit: Payer: Self-pay

## 2020-03-23 ENCOUNTER — Inpatient Hospital Stay (HOSPITAL_COMMUNITY): Payer: Medicare Other | Attending: Hematology

## 2020-03-23 DIAGNOSIS — Z7901 Long term (current) use of anticoagulants: Secondary | ICD-10-CM | POA: Diagnosis not present

## 2020-03-23 DIAGNOSIS — D696 Thrombocytopenia, unspecified: Secondary | ICD-10-CM | POA: Diagnosis present

## 2020-03-23 DIAGNOSIS — D7589 Other specified diseases of blood and blood-forming organs: Secondary | ICD-10-CM | POA: Diagnosis not present

## 2020-03-23 DIAGNOSIS — I2699 Other pulmonary embolism without acute cor pulmonale: Secondary | ICD-10-CM | POA: Insufficient documentation

## 2020-03-23 LAB — CBC WITH DIFFERENTIAL/PLATELET
Abs Immature Granulocytes: 0 10*3/uL (ref 0.00–0.07)
Basophils Absolute: 0 10*3/uL (ref 0.0–0.1)
Basophils Relative: 1 %
Eosinophils Absolute: 0.1 10*3/uL (ref 0.0–0.5)
Eosinophils Relative: 2 %
HCT: 41.6 % (ref 39.0–52.0)
Hemoglobin: 13.6 g/dL (ref 13.0–17.0)
Immature Granulocytes: 0 %
Lymphocytes Relative: 31 %
Lymphs Abs: 1.3 10*3/uL (ref 0.7–4.0)
MCH: 35.9 pg — ABNORMAL HIGH (ref 26.0–34.0)
MCHC: 32.7 g/dL (ref 30.0–36.0)
MCV: 109.8 fL — ABNORMAL HIGH (ref 80.0–100.0)
Monocytes Absolute: 0.6 10*3/uL (ref 0.1–1.0)
Monocytes Relative: 14 %
Neutro Abs: 2.2 10*3/uL (ref 1.7–7.7)
Neutrophils Relative %: 52 %
Platelets: 75 10*3/uL — ABNORMAL LOW (ref 150–400)
RBC: 3.79 MIL/uL — ABNORMAL LOW (ref 4.22–5.81)
RDW: 14.4 % (ref 11.5–15.5)
WBC: 4.1 10*3/uL (ref 4.0–10.5)
nRBC: 0 % (ref 0.0–0.2)

## 2020-03-23 LAB — LACTATE DEHYDROGENASE: LDH: 124 U/L (ref 98–192)

## 2020-03-23 NOTE — Progress Notes (Signed)
Brian Stark, Brian Stark [id number]   CLINIC:  Medical Oncology/Hematology  PCP:  Brian Squibb, MD [place] [address] [phone]   REASON FOR VISIT: Follow-up for thrombocytopenia and unprovoked DVT  CURRENT THERAPY: Observation and Eliquis   INTERVAL HISTORY:  Brian Stark 74 y.o. male returns for routine follow-up for thrombocytopenia and unprovoked DVT.  Patient reports he is taking his Eliquis as prescribed.  He has no unwanted side effects.  He is doing well since his last visit.  He denies any issues with bleeding.  He denies any bright red bleeding per rectum or melena. Denies any nausea, vomiting, or diarrhea. Denies any new pains. Had not noticed any recent bleeding such as epistaxis, hematuria or hematochezia. Denies recent chest pain on exertion, shortness of breath on minimal exertion, pre-syncopal episodes, or palpitations. Denies any numbness or tingling in hands or feet. Denies any recent fevers, infections, or recent hospitalizations. Patient reports appetite at 50% and energy level at 50%.  He is eating well maintain his weight at this time.    REVIEW OF SYSTEMS:  Review of Systems  Constitutional: Positive for fatigue.  Respiratory: Positive for shortness of breath.   Gastrointestinal: Positive for constipation and nausea.  Neurological: Positive for headaches.     PAST MEDICAL/SURGICAL HISTORY:  Past Medical History:  Diagnosis Date  . Bursitis    l shoulder  . Cataract   . Elevated bilirubin   . H/O: rheumatic fever   . Kidney stones   . Migraines   . Pulmonary embolism ([place])   . Rheumatic fever   . Tendonitis    L shoulder   Past Surgical History:  Procedure Laterality Date  . ABDOMINAL SURGERY    . CATARACT EXTRACTION, BILATERAL    . CHOLECYSTECTOMY    . CRANIOTOMY Bilateral 09/06/2018   Procedure: BILATERAL CRANIOTOMIES FOR SUBDURAL HEMATOMA EVACUATION;  Surgeon: Erline Levine, MD;   Location: [place];  Service: Neurosurgery;  Laterality: Bilateral;  . EYE SURGERY       SOCIAL HISTORY:  Social History   Socioeconomic History  . Marital status: Widowed    Spouse name: Not on file  . Number of children: Not on file  . Years of education: Not on file  . Highest education level: Not on file  Occupational History  . Occupation: retired  Tobacco Use  . Smoking status: Current Every Day Smoker    Packs/day: 1.00    Years: 50.00    Pack years: 50.00    Types: Cigarettes  . Smokeless tobacco: Never Used  Substance and Sexual Activity  . Alcohol use: Yes    Comment: beer every 2-3 days  . Drug use: Never  . Sexual activity: Not Currently  Other Topics Concern  . Not on file  Social History Narrative  . Not on file   Social Determinants of Health   Financial Resource Strain:   . Difficulty of Paying Living Expenses:   Food Insecurity:   . Worried About Charity fundraiser in the Last Year:   . Arboriculturist in the Last Year:   Transportation Needs:   . Film/video editor (Medical):   Marland Kitchen Lack of Transportation (Non-Medical):   Physical Activity:   . Days of Exercise per Week:   . Minutes of Exercise per Session:   Stress:   . Feeling of Stress :   Social Connections:   . Frequency of Communication  with Friends and Family:   . Frequency of Social Gatherings with Friends and Family:   . Attends Religious Services:   . Active Member of Clubs or Organizations:   . Attends Banker Meetings:   Marland Kitchen Marital Status:   Intimate Partner Violence:   . Fear of Current or Ex-Partner:   . Emotionally Abused:   Marland Kitchen Physically Abused:   . Sexually Abused:     FAMILY HISTORY:  Family History  Problem Relation Age of Onset  . Atrial fibrillation Mother   . Macular degeneration Mother   . Atrial fibrillation Father   . Macular degeneration Father   . Diabetes Brother   . Seizures Brother   . Pancreatic cancer Brother   . Obesity Brother      CURRENT MEDICATIONS:  Outpatient Encounter Medications as of 03/23/2020  Medication Sig  . AIMOVIG 70 MG/ML SOAJ Inject 1 Syringe into the skin every 30 (thirty) days.  Marland Kitchen apixaban (ELIQUIS) 5 MG TABS tablet Take 5 mg by mouth 2 (two) times daily.  . vitamin B-12 (CYANOCOBALAMIN) 1000 MCG tablet Take 1,000 mcg by mouth daily.  . cyclobenzaprine (FLEXERIL) 10 MG tablet Take 10 mg by mouth every 8 (eight) hours as needed.  Marland Kitchen HYDROcodone-acetaminophen (NORCO/VICODIN) 5-325 MG tablet Take 1 tablet by mouth every 6 (six) hours as needed.  . ondansetron (ZOFRAN) 4 MG tablet Take 4 mg by mouth every 6 (six) hours as needed for nausea or vomiting.  Bertram Gala Glycol-Propyl Glycol 0.4-0.3 % SOLN Apply 1 drop to eye as needed.    No facility-administered encounter medications on file as of 03/23/2020.    ALLERGIES:  Allergies  Allergen Reactions  . Flucloxacillin Anaphylaxis     PHYSICAL EXAM:  ECOG Performance status: 1  Vitals:   03/23/20 1058  BP: 130/82  Pulse: 84  Resp: 18  Temp: (!) 97.1 F (36.2 C)  SpO2: 100%   Filed Weights   03/23/20 1058  Weight: 152 lb (68.9 kg)   Physical Exam Constitutional:      Appearance: Normal appearance. He is normal weight.  Cardiovascular:     Rate and Rhythm: Normal rate and regular rhythm.     Heart sounds: Normal heart sounds.  Pulmonary:     Effort: Pulmonary effort is normal.     Breath sounds: Normal breath sounds.  Abdominal:     General: Bowel sounds are normal.     Palpations: Abdomen is soft.  Musculoskeletal:        General: Normal range of motion.  Skin:    General: Skin is warm.  Neurological:     Mental Status: He is alert and oriented to person, place, and time. Mental status is at baseline.  Psychiatric:        Mood and Affect: Mood normal.        Behavior: Behavior normal.        Thought Content: Thought content normal.        Judgment: Judgment normal.      LABORATORY DATA:  I have reviewed the labs as  listed.  CBC    Component Value Date/Time   WBC 4.1 03/23/2020 0953   RBC 3.79 (L) 03/23/2020 0953   HGB 13.6 03/23/2020 0953   HCT 41.6 03/23/2020 0953   PLT 75 (L) 03/23/2020 0953   MCV 109.8 (H) 03/23/2020 0953   MCH 35.9 (H) 03/23/2020 0953   MCHC 32.7 03/23/2020 0953   RDW 14.4 03/23/2020 0953   LYMPHSABS 1.3 03/23/2020  0953   MONOABS 0.6 03/23/2020 0953   EOSABS 0.1 03/23/2020 0953   BASOSABS 0.0 03/23/2020 0953   CMP Latest Ref Rng & Units 10/26/2019 10/17/2019 10/16/2019  Glucose 70 - 99 mg/dL 93 88 161(W)  BUN 8 - 23 mg/dL 8 11 15   Creatinine 0.61 - 1.24 mg/dL 9.60 4.54  Sodium 135 - 145 mmol/L 137 141 139  Potassium 3.5 - 5.1 mmol/L 3.4(L) 3.8 3.6  Chloride 98 - 111 mmol/L 102 104 104  CO2 22 - 32 mmol/L 27 30 26   Calcium 8.9 - 10.3 mg/dL 0.98) 9.3 9.2  Total Protein 6.5 - 8.1 g/dL 6.9 - -  Total Bilirubin 0.3 - 1.2 mg/dL 1.2 - -  Alkaline Phos 38 - 126 U/L 118 - -  AST 15 - 41 U/L 22 - -  ALT 0 - 44 U/L 17 - -    All questions were answered to patient's stated satisfaction. Encouraged patient to call with any new concerns or questions before his next visit to the cancer center and we can certain see him sooner, if needed.     ASSESSMENT & PLAN:  Thrombocytopenia (H[facility]) 1.  Moderate thrombocytopenia: -She has moderate thrombocytopenia with platelet count ranging between 81-115 since November 2019. -Also had mild macrocytic anemia during the same time with MCV around 109. -CT of the abdomen and pelvis on 09/19/2019 showed normal spleen and liver. -Work-up included SPEP which was negative.  Hepatitis panel negative.  Folic acid and copper levels normal.  B12 level was normal however he does take B12 injections with his PCP. -Labs on 10/26/2019 showed his platelet count normalized to 198. -Labs done on 03/23/2020 showed his platelet count has dropped to 75.  Hemoglobin is stable at 13.6 and WBC 4.1. -We will see him back sooner in 2 months to repeat  labs.  2.  Unprovoked bilateral pulmonary embolism: -He was admitted to the hospital from 10/15/2019 through 10/17/2019 with hemoptysis. -CT chest on 10/16/2019 showed acute bilateral pulmonary embolism greater within the left lower lobe.  No evidence of right heart strain. -Dopplers on 10/16/2019 showed no evidence of DVT. -He do not have any risk factors for clots.  He is currently on Eliquis. -Based on unprovoked nature of pulmonary embolism he require long-term anticoagulation.  However he is at high risk for falls and bleeding.  He will continue evaluation for risk-benefit she ratio. -I would not recommend hypercoagulable testing at this time.  3.  Macrocytosis: -His latest hemoglobin was normal.  However his MCV was higher. -Folic acid and B12 are normal.  However he receives B12 injections from his PCP. -This could be from early myelodysplasia.     Orders placed this encounter:  Orders Placed This Encounter  Procedures  . Lactate dehydrogenase  . CBC with Differential/Platelet  . Comprehensive metabolic panel  . Vitamin B12  . VITAMIN D 25 Hydroxy (Vit-D Deficiency, Fractures)      10/18/2019, FNP-C [facility] [phone]

## 2020-03-23 NOTE — Assessment & Plan Note (Signed)
1.  Moderate thrombocytopenia: -She has moderate thrombocytopenia with platelet count ranging between 81-115 since November 2019. -Also had mild macrocytic anemia during the same time with MCV around 109. -CT of the abdomen and pelvis on 09/19/2019 showed normal spleen and liver. -Work-up included SPEP which was negative.  Hepatitis panel negative.  Folic acid and copper levels normal.  B12 level was normal however he does take B12 injections with his PCP. -Labs on 10/26/2019 showed his platelet count normalized to 198. -Labs done on 03/23/2020 showed his platelet count has dropped to 75.  Hemoglobin is stable at 13.6 and WBC 4.1. -We will see him back sooner in 2 months to repeat labs.  2.  Unprovoked bilateral pulmonary embolism: -He was admitted to the hospital from 10/15/2019 through 10/17/2019 with hemoptysis. -CT chest on 10/16/2019 showed acute bilateral pulmonary embolism greater within the left lower lobe.  No evidence of right heart strain. -Dopplers on 10/16/2019 showed no evidence of DVT. -He do not have any risk factors for clots.  He is currently on Eliquis. -Based on unprovoked nature of pulmonary embolism he require long-term anticoagulation.  However he is at high risk for falls and bleeding.  He will continue evaluation for risk-benefit she ratio. -I would not recommend hypercoagulable testing at this time.  3.  Macrocytosis: -His latest hemoglobin was normal.  However his MCV was higher. -Folic acid and B12 are normal.  However he receives B12 injections from his PCP. -This could be from early myelodysplasia.

## 2020-03-23 NOTE — Patient Instructions (Signed)
[facility] at [facility] Discharge Instructions  Follow up in 2 months with labs    Thank you for choosing [facility] at [facility] to provide your oncology and hematology care.  To afford each patient quality time with our provider, please arrive at least 15 minutes before your scheduled appointment time.   If you have a lab appointment with the Cancer Center please come in thru the Main Entrance and check in at the main information desk.  You need to re-schedule your appointment should you arrive 10 or more minutes late.  We strive to give you quality time with our providers, and arriving late affects you and other patients whose appointments are after yours.  Also, if you no show three or more times for appointments you may be dismissed from the clinic at the providers discretion.     Again, thank you for choosing [facility].  Our hope is that these requests will decrease the amount of time that you wait before being seen by our physicians.       _____________________________________________________________  Should you have questions after your visit to  Cancer Center, please contact our office at [phone] between the hours of 8:00 a.m. and 4:30 p.m.  Voicemails left after 4:00 p.m. will not be returned until the following business day.  For prescription refill requests, have your pharmacy contact our office and allow 72 hours.    Due to Covid, you will need to wear a mask upon entering the hospital. If you do not have a mask, a mask will be given to you at the Main Entrance upon arrival. For doctor visits, patients may have 1 support person with them. For treatment visits, patients can not have anyone with them due to social distancing guidelines and our immunocompromised population.      

## 2020-05-17 ENCOUNTER — Other Ambulatory Visit: Payer: Self-pay

## 2020-05-17 ENCOUNTER — Inpatient Hospital Stay (HOSPITAL_COMMUNITY): Payer: Medicare Other | Attending: Hematology

## 2020-05-17 DIAGNOSIS — Z86711 Personal history of pulmonary embolism: Secondary | ICD-10-CM | POA: Insufficient documentation

## 2020-05-17 DIAGNOSIS — R0602 Shortness of breath: Secondary | ICD-10-CM | POA: Insufficient documentation

## 2020-05-17 DIAGNOSIS — Z880 Allergy status to penicillin: Secondary | ICD-10-CM | POA: Diagnosis not present

## 2020-05-17 DIAGNOSIS — Z7901 Long term (current) use of anticoagulants: Secondary | ICD-10-CM | POA: Insufficient documentation

## 2020-05-17 DIAGNOSIS — Z820 Family history of epilepsy and other diseases of the nervous system: Secondary | ICD-10-CM | POA: Insufficient documentation

## 2020-05-17 DIAGNOSIS — Z833 Family history of diabetes mellitus: Secondary | ICD-10-CM | POA: Diagnosis not present

## 2020-05-17 DIAGNOSIS — R110 Nausea: Secondary | ICD-10-CM | POA: Diagnosis not present

## 2020-05-17 DIAGNOSIS — D7589 Other specified diseases of blood and blood-forming organs: Secondary | ICD-10-CM | POA: Diagnosis not present

## 2020-05-17 DIAGNOSIS — R519 Headache, unspecified: Secondary | ICD-10-CM | POA: Insufficient documentation

## 2020-05-17 DIAGNOSIS — Z79899 Other long term (current) drug therapy: Secondary | ICD-10-CM | POA: Insufficient documentation

## 2020-05-17 DIAGNOSIS — F17210 Nicotine dependence, cigarettes, uncomplicated: Secondary | ICD-10-CM | POA: Diagnosis not present

## 2020-05-17 DIAGNOSIS — K5900 Constipation, unspecified: Secondary | ICD-10-CM | POA: Diagnosis not present

## 2020-05-17 DIAGNOSIS — I2699 Other pulmonary embolism without acute cor pulmonale: Secondary | ICD-10-CM | POA: Diagnosis not present

## 2020-05-17 DIAGNOSIS — Z87442 Personal history of urinary calculi: Secondary | ICD-10-CM | POA: Insufficient documentation

## 2020-05-17 DIAGNOSIS — R5383 Other fatigue: Secondary | ICD-10-CM | POA: Diagnosis not present

## 2020-05-17 DIAGNOSIS — D696 Thrombocytopenia, unspecified: Secondary | ICD-10-CM | POA: Insufficient documentation

## 2020-05-17 DIAGNOSIS — R42 Dizziness and giddiness: Secondary | ICD-10-CM | POA: Diagnosis not present

## 2020-05-17 DIAGNOSIS — Z8349 Family history of other endocrine, nutritional and metabolic diseases: Secondary | ICD-10-CM | POA: Insufficient documentation

## 2020-05-17 DIAGNOSIS — Z8249 Family history of ischemic heart disease and other diseases of the circulatory system: Secondary | ICD-10-CM | POA: Insufficient documentation

## 2020-05-17 DIAGNOSIS — Z83518 Family history of other specified eye disorder: Secondary | ICD-10-CM | POA: Diagnosis not present

## 2020-05-17 LAB — COMPREHENSIVE METABOLIC PANEL
ALT: 19 U/L (ref 0–44)
AST: 25 U/L (ref 15–41)
Albumin: 4.1 g/dL (ref 3.5–5.0)
Alkaline Phosphatase: 84 U/L (ref 38–126)
Anion gap: 6 (ref 5–15)
BUN: 9 mg/dL (ref 8–23)
CO2: 28 mmol/L (ref 22–32)
Calcium: 8.9 mg/dL (ref 8.9–10.3)
Chloride: 104 mmol/L (ref 98–111)
Creatinine, Ser: 1.35 mg/dL — ABNORMAL HIGH (ref 0.61–1.24)
GFR calc Af Amer: 60 mL/min — ABNORMAL LOW (ref >60)
GFR calc non Af Amer: 52 mL/min — ABNORMAL LOW (ref >60)
Glucose, Bld: 110 mg/dL — ABNORMAL HIGH (ref 70–99)
Potassium: 3.7 mmol/L (ref 3.5–5.1)
Sodium: 138 mmol/L (ref 135–145)
Total Bilirubin: 1.8 mg/dL — ABNORMAL HIGH (ref 0.3–1.2)
Total Protein: 6.9 g/dL (ref 6.5–8.1)

## 2020-05-17 LAB — CBC WITH DIFFERENTIAL/PLATELET
Abs Immature Granulocytes: 0.02 10*3/uL (ref 0.00–0.07)
Basophils Absolute: 0 10*3/uL (ref 0.0–0.1)
Basophils Relative: 1 %
Eosinophils Absolute: 0.1 10*3/uL (ref 0.0–0.5)
Eosinophils Relative: 2 %
HCT: 43.3 % (ref 39.0–52.0)
Hemoglobin: 14.4 g/dL (ref 13.0–17.0)
Immature Granulocytes: 1 %
Lymphocytes Relative: 30 %
Lymphs Abs: 1.2 10*3/uL (ref 0.7–4.0)
MCH: 36.1 pg — ABNORMAL HIGH (ref 26.0–34.0)
MCHC: 33.3 g/dL (ref 30.0–36.0)
MCV: 108.5 fL — ABNORMAL HIGH (ref 80.0–100.0)
Monocytes Absolute: 0.5 10*3/uL (ref 0.1–1.0)
Monocytes Relative: 12 %
Neutro Abs: 2.2 10*3/uL (ref 1.7–7.7)
Neutrophils Relative %: 54 %
Platelets: 84 10*3/uL — ABNORMAL LOW (ref 150–400)
RBC: 3.99 MIL/uL — ABNORMAL LOW (ref 4.22–5.81)
RDW: 14.2 % (ref 11.5–15.5)
WBC: 4.1 10*3/uL (ref 4.0–10.5)
nRBC: 0 % (ref 0.0–0.2)

## 2020-05-17 LAB — VITAMIN B12: Vitamin B-12: 552 pg/mL (ref 180–914)

## 2020-05-17 LAB — VITAMIN D 25 HYDROXY (VIT D DEFICIENCY, FRACTURES): Vit D, 25-Hydroxy: 29.59 ng/mL — ABNORMAL LOW (ref 30–100)

## 2020-05-17 LAB — LACTATE DEHYDROGENASE: LDH: 109 U/L (ref 98–192)

## 2020-05-24 ENCOUNTER — Inpatient Hospital Stay (HOSPITAL_BASED_OUTPATIENT_CLINIC_OR_DEPARTMENT_OTHER): Payer: Medicare Other | Admitting: Hematology

## 2020-05-24 ENCOUNTER — Other Ambulatory Visit: Payer: Self-pay

## 2020-05-24 VITALS — BP 131/84 | HR 77 | Temp 98.7°F | Resp 18 | Wt 151.7 lb

## 2020-05-24 DIAGNOSIS — I2699 Other pulmonary embolism without acute cor pulmonale: Secondary | ICD-10-CM | POA: Diagnosis not present

## 2020-05-24 DIAGNOSIS — D696 Thrombocytopenia, unspecified: Secondary | ICD-10-CM

## 2020-05-24 NOTE — Patient Instructions (Signed)
[facility] at [facility] Discharge Instructions  You were seen today by Dr. Katragadda. He went over your recent results. Dr. Katragadda will see you back in 6 months for labs and follow up.   Thank you for choosing [facility] at [facility] to provide your oncology and hematology care.  To afford each patient quality time with our provider, please arrive at least 15 minutes before your scheduled appointment time.   If you have a lab appointment with the Cancer Center please come in thru the Main Entrance and check in at the main information desk  You need to re-schedule your appointment should you arrive 10 or more minutes late.  We strive to give you quality time with our providers, and arriving late affects you and other patients whose appointments are after yours.  Also, if you no show three or more times for appointments you may be dismissed from the clinic at the providers discretion.     Again, thank you for choosing [facility].  Our hope is that these requests will decrease the amount of time that you wait before being seen by our physicians.       _____________________________________________________________  Should you have questions after your visit to [facility], please contact our office at [phone] between the hours of 8:00 a.m. and 4:30 p.m.  Voicemails left after 4:00 p.m. will not be returned until the following business day.  For prescription refill requests, have your pharmacy contact our office and allow 72 hours.    Cancer Center Support Programs:   > Cancer Support Group  2nd Tuesday of the month 1pm-2pm, Journey Room    

## 2020-05-24 NOTE — Progress Notes (Signed)
[facility] [address], [place] [address]   CLINIC:  Medical Oncology/Hematology  PCP:  Benita Stabile, MD [address] [name] [address]  [phone]  REASON FOR VISIT:  Follow-up for thrombocytopenia and pulmonary embolism  PRIOR THERAPY: None  CURRENT THERAPY: Not done  INTERVAL HISTORY:  Brian Stark, a 74 y.o. male, returns for routine follow-up for his thrombocytopenia and pulmonary embolism. Brian Stark was last seen on 11/16/2019.  Today he reports that he is tolerating the Eliquis well and has blood coming from his gums when he brushes his mouth, but denies hematochezia or hematuria. He complains of ongoing fatigue and is wondering if it is caused by low hemoglobin; he reports that he feels more fatigued when he has his headaches. He also gets Amovig injections monthly at his PCP for his headaches.   REVIEW OF SYSTEMS:  Review of Systems  Constitutional: Positive for fatigue (severe). Negative for appetite change.  Respiratory: Positive for shortness of breath.   Gastrointestinal: Positive for constipation and nausea. Negative for blood in stool.  Genitourinary: Negative for hematuria.   Neurological: Positive for dizziness and headaches (stable).  All other systems reviewed and are negative.   PAST MEDICAL/SURGICAL HISTORY:  Past Medical History:  Diagnosis Date  . Bursitis    l shoulder  . Cataract   . Elevated bilirubin   . H/O: rheumatic fever   . Kidney stones   . Migraines   . Pulmonary embolism (HCC)   . Rheumatic fever   . Tendonitis    L shoulder   Past Surgical History:  Procedure Laterality Date  . ABDOMINAL SURGERY    . CATARACT EXTRACTION, BILATERAL    . CHOLECYSTECTOMY    . CRANIOTOMY Bilateral 09/06/2018   Procedure: BILATERAL CRANIOTOMIES FOR SUBDURAL HEMATOMA EVACUATION;  Surgeon: Maeola Harman, MD;  Location: [facility] OR;  Service: Neurosurgery;  Laterality: Bilateral;  . EYE SURGERY      SOCIAL  HISTORY:  Social History   Socioeconomic History  . Marital status: Widowed    Spouse name: Not on file  . Number of children: Not on file  . Years of education: Not on file  . Highest education level: Not on file  Occupational History  . Occupation: retired  Tobacco Use  . Smoking status: Current Every Day Smoker    Packs/day: 1.00    Years: 50.00    Pack years: 50.00    Types: Cigarettes  . Smokeless tobacco: Never Used  Vaping Use  . Vaping Use: Never used  Substance and Sexual Activity  . Alcohol use: Yes    Comment: beer every 2-3 days  . Drug use: Never  . Sexual activity: Not Currently  Other Topics Concern  . Not on file  Social History Narrative  . Not on file   Social Determinants of Health   Financial Resource Strain:   . Difficulty of Paying Living Expenses:   Food Insecurity:   . Worried About Programme researcher, broadcasting/film/video in the Last Year:   . Barista in the Last Year:   Transportation Needs:   . Freight forwarder (Medical):   Marland Kitchen Lack of Transportation (Non-Medical):   Physical Activity:   . Days of Exercise per Week:   . Minutes of Exercise per Session:   Stress:   . Feeling of Stress :   Social Connections:   . Frequency of Communication with Friends and Family:   .  Frequency of Social Gatherings with Friends and Family:   . Attends Religious Services:   . Active Member of Clubs or Organizations:   . Attends Banker Meetings:   Marland Kitchen Marital Status:   Intimate Partner Violence:   . Fear of Current or Ex-Partner:   . Emotionally Abused:   Marland Kitchen Physically Abused:   . Sexually Abused:     FAMILY HISTORY:  Family History  Problem Relation Age of Onset  . Atrial fibrillation Mother   . Macular degeneration Mother   . Atrial fibrillation Father   . Macular degeneration Father   . Diabetes Brother   . Seizures Brother   . Pancreatic cancer Brother   . Obesity Brother     CURRENT MEDICATIONS:  Current Outpatient Medications    Medication Sig Dispense Refill  . AIMOVIG 70 MG/ML SOAJ Inject 1 Syringe into the skin every 30 (thirty) days.    Marland Kitchen apixaban (ELIQUIS) 5 MG TABS tablet Take 5 mg by mouth 2 (two) times daily.    . vitamin B-12 (CYANOCOBALAMIN) 1000 MCG tablet Take 1,000 mcg by mouth daily.    . cyclobenzaprine (FLEXERIL) 10 MG tablet Take 10 mg by mouth every 8 (eight) hours as needed. (Patient not taking: Reported on 05/24/2020)    . HYDROcodone-acetaminophen (NORCO/VICODIN) 5-325 MG tablet Take 1 tablet by mouth every 6 (six) hours as needed. (Patient not taking: Reported on 05/24/2020)    . ondansetron (ZOFRAN) 4 MG tablet Take 4 mg by mouth every 6 (six) hours as needed for nausea or vomiting. (Patient not taking: Reported on 05/24/2020)    . Polyethyl Glycol-Propyl Glycol 0.4-0.3 % SOLN Apply 1 drop to eye as needed.  (Patient not taking: Reported on 05/24/2020)     No current facility-administered medications for this visit.    ALLERGIES:  Allergies  Allergen Reactions  . Flucloxacillin Anaphylaxis    PHYSICAL EXAM:  Performance status (ECOG): 1 - Symptomatic but completely ambulatory  Vitals:   05/24/20 1500  BP: (!) 131/84  Pulse: 77  Resp: 18  Temp: 98.7 F (37.1 C)  SpO2: 98%   Wt Readings from Last 3 Encounters:  05/24/20 151 lb 11.2 oz (68.8 kg)  03/23/20 152 lb (68.9 kg)  11/16/19 146 lb 14.4 oz (66.6 kg)   Physical Exam Vitals reviewed.  Constitutional:      Appearance: Normal appearance.  Cardiovascular:     Rate and Rhythm: Normal rate and regular rhythm.     Pulses: Normal pulses.     Heart sounds: Normal heart sounds.  Pulmonary:     Effort: Pulmonary effort is normal.     Breath sounds: Normal breath sounds.  Abdominal:     Palpations: Abdomen is soft.     Tenderness: There is no abdominal tenderness.  Musculoskeletal:     Right lower leg: No edema.     Left lower leg: No edema.  Lymphadenopathy:     Cervical: No cervical adenopathy.     Upper Body:     Right  upper body: No supraclavicular or axillary adenopathy.     Left upper body: No supraclavicular or axillary adenopathy.  Neurological:     General: No focal deficit present.     Mental Status: He is alert and oriented to person, place, and time.  Psychiatric:        Mood and Affect: Mood normal.        Behavior: Behavior normal.     LABORATORY DATA:  I have reviewed the  labs as listed.  CBC Latest Ref Rng & Units 05/17/2020 03/23/2020 10/26/2019  WBC 4.0 - 10.5 K/uL 4.1 4.1 4.7  Hemoglobin 13.0 - 17.0 g/dL 93.7 16.9 67.8  Hematocrit 39 - 52 % 43.3 41.6 42.2  Platelets 150 - 400 K/uL 84(L) 75(L) 198   CMP Latest Ref Rng & Units 05/17/2020 10/26/2019 10/17/2019  Glucose 70 - 99 mg/dL 938(B) 93 88  BUN 8 - 23 mg/dL 9 8 11   Creatinine 0.61 - 1.24 mg/dL ) 0.17(P 1.02  Sodium 135 - 145 mmol/L 138 137 141  Potassium 3.5 - 5.1 mmol/L 3.7 3.4(L) 3.8  Chloride 98 - 111 mmol/L 104 102 104  CO2 22 - 32 mmol/L 28 27 30   Calcium 8.9 - 10.3 mg/dL 8.9 5.85) 9.3  Total Protein 6.5 - 8.1 g/dL 6.9 6.9 -  Total Bilirubin 0.3 - 1.2 mg/dL ) 1.2 -  Alkaline Phos 38 - 126 U/L 84 118 -  AST 15 - 41 U/L 25 22 -  ALT 0 - 44 U/L 19 17 -      Component Value Date/Time   RBC 3.99 (L) 05/17/2020 1322   MCV 108.5 (H) 05/17/2020 1322   MCH 36.1 (H) 05/17/2020 1322   MCHC 33.3 05/17/2020 1322   RDW 14.2 05/17/2020 1322   LYMPHSABS 1.2 05/17/2020 1322   MONOABS 0.5 05/17/2020 1322   EOSABS 0.1 05/17/2020 1322   BASOSABS 0.0 05/17/2020 1322   Lab Results  Component Value Date   LDH 109 05/17/2020   LDH 124 03/23/2020   LDH 131 10/26/2019   Lab Results  Component Value Date   VD25OH 29.59 (L) 05/17/2020      DIAGNOSTIC IMAGING:  I have independently reviewed the scans and discussed with the patient.   ASSESSMENT:  1.  Moderate thrombocytopenia: -Moderate thrombocytopenia with platelet count between 81-1 15 since November 2019. -CTAP on 09/19/2019 showed normal spleen and  liver. -Nutritional deficiency work-up, SPEP and hepatitis panel negative. -Differential diagnosis includes immune mediated thrombocytopenia versus MDS.  2.  Unprovoked bilateral pulmonary embolism: -Hospital admission from 10/15/2019 through 10/17/2019 with hemoptysis. -CT chest on 10/16/2019 showed acute bilateral pulmonary emboli greater within the left lower lobe with no evidence of right heart strain.  Dopplers showed no evidence of DVT. -He did not have any risk factors for the clot.  Currently on Eliquis.  3.  Macrocytosis: -High MCV with normal hemoglobin.  B12 and folic acid was normal. -Differential includes early myelodysplasia.  PLAN:  1.  Moderate thrombocytopenia: -CBC on 05/17/2020 showed platelet count 84. -He does not have any bleeding issues.  He does have easy bruising. -He complained of some fatigue which could be from underlying migraines.  He is planning on using Aimovig. -I plan to see him back in 6 months with labs.  2.  Unprovoked bilateral pulmonary embolism: -Because of unprovoked nature of the pulmonary embolism, he will require long-term anticoagulation.  I will continue to monitor risk-benefit ratio intermittently.  3.  Macrocytosis: -Latest MCV is 108.5.  However hemoglobin is normal at 14.4.  B12 was 552.  Orders placed this encounter:  No orders of the defined types were placed in this encounter.    10/18/2019, MD [facility] [phone]   I, [facility], am acting as a scribe for Dr. [phone].  I, Drue Second MD, have reviewed the above documentation for accuracy and completeness, and I agree with the above.

## 2020-08-15 ENCOUNTER — Ambulatory Visit (HOSPITAL_COMMUNITY)
Admission: RE | Admit: 2020-08-15 | Discharge: 2020-08-15 | Disposition: A | Discharge status: Home / Self Care | Payer: Medicare Other | Source: Ambulatory Visit | Attending: Adult Health Nurse Practitioner | Admitting: Adult Health Nurse Practitioner

## 2020-08-15 ENCOUNTER — Other Ambulatory Visit: Payer: Self-pay

## 2020-08-15 ENCOUNTER — Other Ambulatory Visit (HOSPITAL_COMMUNITY): Payer: Self-pay | Admitting: Adult Health Nurse Practitioner

## 2020-08-15 DIAGNOSIS — R042 Hemoptysis: Secondary | ICD-10-CM

## 2020-09-30 ENCOUNTER — Encounter (HOSPITAL_COMMUNITY): Payer: Self-pay

## 2020-09-30 ENCOUNTER — Other Ambulatory Visit: Payer: Self-pay

## 2020-09-30 ENCOUNTER — Observation Stay (HOSPITAL_COMMUNITY)
Admission: EM | Admit: 2020-09-30 | Discharge: 2020-10-01 | Disposition: A | Discharge status: Home / Self Care | Payer: Medicare Other | Attending: Internal Medicine | Admitting: Internal Medicine

## 2020-09-30 ENCOUNTER — Emergency Department (HOSPITAL_COMMUNITY): Payer: Medicare Other

## 2020-09-30 DIAGNOSIS — I82411 Acute embolism and thrombosis of right femoral vein: Secondary | ICD-10-CM

## 2020-09-30 DIAGNOSIS — Z87820 Personal history of traumatic brain injury: Secondary | ICD-10-CM | POA: Insufficient documentation

## 2020-09-30 DIAGNOSIS — F17210 Nicotine dependence, cigarettes, uncomplicated: Secondary | ICD-10-CM | POA: Insufficient documentation

## 2020-09-30 DIAGNOSIS — S065XAA Traumatic subdural hemorrhage with loss of consciousness status unknown, initial encounter: Secondary | ICD-10-CM | POA: Diagnosis present

## 2020-09-30 DIAGNOSIS — Z8669 Personal history of other diseases of the nervous system and sense organs: Secondary | ICD-10-CM | POA: Insufficient documentation

## 2020-09-30 DIAGNOSIS — Z7901 Long term (current) use of anticoagulants: Secondary | ICD-10-CM | POA: Insufficient documentation

## 2020-09-30 DIAGNOSIS — N289 Disorder of kidney and ureter, unspecified: Secondary | ICD-10-CM

## 2020-09-30 DIAGNOSIS — R0600 Dyspnea, unspecified: Secondary | ICD-10-CM | POA: Diagnosis present

## 2020-09-30 DIAGNOSIS — M7989 Other specified soft tissue disorders: Secondary | ICD-10-CM

## 2020-09-30 DIAGNOSIS — E876 Hypokalemia: Secondary | ICD-10-CM | POA: Insufficient documentation

## 2020-09-30 DIAGNOSIS — D696 Thrombocytopenia, unspecified: Secondary | ICD-10-CM | POA: Diagnosis not present

## 2020-09-30 DIAGNOSIS — Z20822 Contact with and (suspected) exposure to covid-19: Secondary | ICD-10-CM | POA: Insufficient documentation

## 2020-09-30 DIAGNOSIS — I2699 Other pulmonary embolism without acute cor pulmonale: Secondary | ICD-10-CM | POA: Diagnosis not present

## 2020-09-30 DIAGNOSIS — I82401 Acute embolism and thrombosis of unspecified deep veins of right lower extremity: Secondary | ICD-10-CM | POA: Diagnosis not present

## 2020-09-30 LAB — RESP PANEL BY RT-PCR (FLU A&B, COVID) ARPGX2
Influenza A by PCR: NEGATIVE
Influenza B by PCR: NEGATIVE
SARS Coronavirus 2 by RT PCR: NEGATIVE

## 2020-09-30 LAB — CBC
HCT: 40.3 % (ref 39.0–52.0)
Hemoglobin: 13.6 g/dL (ref 13.0–17.0)
MCH: 37.4 pg — ABNORMAL HIGH (ref 26.0–34.0)
MCHC: 33.7 g/dL (ref 30.0–36.0)
MCV: 110.7 fL — ABNORMAL HIGH (ref 80.0–100.0)
Platelets: 68 10*3/uL — ABNORMAL LOW (ref 150–400)
RBC: 3.64 MIL/uL — ABNORMAL LOW (ref 4.22–5.81)
RDW: 14.5 % (ref 11.5–15.5)
WBC: 5.9 10*3/uL (ref 4.0–10.5)
nRBC: 0 % (ref 0.0–0.2)

## 2020-09-30 LAB — COMPREHENSIVE METABOLIC PANEL
ALT: 12 U/L (ref 0–44)
AST: 20 U/L (ref 15–41)
Albumin: 3.6 g/dL (ref 3.5–5.0)
Alkaline Phosphatase: 111 U/L (ref 38–126)
Anion gap: 6 (ref 5–15)
BUN: 14 mg/dL (ref 8–23)
CO2: 27 mmol/L (ref 22–32)
Calcium: 8.9 mg/dL (ref 8.9–10.3)
Chloride: 103 mmol/L (ref 98–111)
Creatinine, Ser: 1.31 mg/dL — ABNORMAL HIGH (ref 0.61–1.24)
GFR, Estimated: 57 mL/min — ABNORMAL LOW (ref >60)
Glucose, Bld: 96 mg/dL (ref 70–99)
Potassium: 3.4 mmol/L — ABNORMAL LOW (ref 3.5–5.1)
Sodium: 136 mmol/L (ref 135–145)
Total Bilirubin: 2.5 mg/dL — ABNORMAL HIGH (ref 0.3–1.2)
Total Protein: 6.6 g/dL (ref 6.5–8.1)

## 2020-09-30 LAB — PROTIME-INR
INR: 1 (ref 0.8–1.2)
Prothrombin Time: 12.8 seconds (ref 11.4–15.2)

## 2020-09-30 LAB — MAGNESIUM: Magnesium: 1.9 mg/dL (ref 1.7–2.4)

## 2020-09-30 LAB — TROPONIN I (HIGH SENSITIVITY): Troponin I (High Sensitivity): 3 ng/L (ref <18)

## 2020-09-30 MED ORDER — IOHEXOL 350 MG/ML SOLN
75.0000 mL | Freq: Once | INTRAVENOUS | Status: AC | PRN
  Administered 2020-09-30: 75 mL via INTRAVENOUS

## 2020-09-30 MED ORDER — MORPHINE SULFATE (PF) 4 MG/ML IV SOLN
2.0000 mg | Freq: Once | INTRAVENOUS | Status: AC
  Administered 2020-09-30: 2 mg via INTRAVENOUS
  Filled 2020-09-30: qty 1

## 2020-09-30 MED ORDER — HEPARIN (PORCINE) 25000 UT/250ML-% IV SOLN
1150.0000 [IU]/h | INTRAVENOUS | Status: DC
  Administered 2020-09-30: 1000 [IU]/h via INTRAVENOUS
  Filled 2020-09-30: qty 250

## 2020-09-30 MED ORDER — ACETAMINOPHEN 325 MG PO TABS
650.0000 mg | ORAL_TABLET | Freq: Four times a day (QID) | ORAL | Status: DC | PRN
  Administered 2020-09-30: 650 mg via ORAL
  Filled 2020-09-30: qty 2

## 2020-09-30 MED ORDER — POLYETHYLENE GLYCOL 3350 17 G PO PACK: 17.0000 g | PACK | Freq: Every day | ORAL | Status: DC | PRN

## 2020-09-30 MED ORDER — HEPARIN BOLUS VIA INFUSION
4000.0000 [IU] | Freq: Once | INTRAVENOUS | Status: AC
  Administered 2020-09-30: 4000 [IU] via INTRAVENOUS

## 2020-09-30 MED ORDER — ACETAMINOPHEN 650 MG RE SUPP: 650.0000 mg | Freq: Four times a day (QID) | RECTAL | Status: DC | PRN

## 2020-09-30 MED ORDER — ONDANSETRON HCL 4 MG PO TABS: 4.0000 mg | ORAL_TABLET | Freq: Four times a day (QID) | ORAL | Status: DC | PRN

## 2020-09-30 MED ORDER — POTASSIUM CHLORIDE IN NACL 40-0.9 MEQ/L-% IV SOLN
INTRAVENOUS | Status: AC
  Filled 2020-09-30 (×2): qty 1000

## 2020-09-30 MED ORDER — POTASSIUM CHLORIDE CRYS ER 20 MEQ PO TBCR
40.0000 meq | EXTENDED_RELEASE_TABLET | Freq: Once | ORAL | Status: AC
  Administered 2020-09-30: 40 meq via ORAL
  Filled 2020-09-30: qty 2

## 2020-09-30 MED ORDER — ONDANSETRON HCL 4 MG/2ML IJ SOLN: 4.0000 mg | Freq: Four times a day (QID) | INTRAMUSCULAR | Status: DC | PRN

## 2020-09-30 NOTE — ED Provider Notes (Signed)
[facility] EMERGENCY DEPARTMENT Provider Note   CSN: [id number] Arrival date & time: 09/30/20  1302     History Chief Complaint  Patient presents with  . Leg Pain    Brian Stark is a 74 y.o. male.  HPI    This patient is a very pleasant 74 year old male, he has a history of a pulmonary embolism that was diagnosed in December 2020, shortly after being admitted to the hospital for subdural hematomas requiring bilateral craniotomies.  He had been on Eliquis until the middle of October approximately 6 weeks ago when this medication was discontinued.  There was no other cause of his blood clots that were discovered at that time.  The patient notes that he is done well except for the last couple of days where he has noticed some repeat increasing in pain and swelling of his right leg which was grossly noticeable today and some associated blood in his sputum though he says it feels like more drainage from his head than it is from his lungs.  He does have a slight feeling of shortness of breath but is unsure whether that is related to pain in his leg or some anxiety regarding what might be going on.  He has not had any medications for this pain prior to arrival.  He is not having any fevers or chills and denies any injury to the leg.  Past Medical History:  Diagnosis Date  . Bursitis    l shoulder  . Cataract   . Elevated bilirubin   . H/O: rheumatic fever   . Kidney stones   . Migraines   . Pulmonary embolism (HCC)   . Rheumatic fever   . Tendonitis    L shoulder    Patient Active Problem List   Diagnosis Date Noted  . PE (pulmonary thromboembolism) (HCC) 10/16/2019  . Thrombocytopenia (HCC) 10/16/2019  . Pulmonary infarction (HCC) 10/16/2019  . Hemoptysis 10/16/2019  . Acute pulmonary embolism (HCC) 10/16/2019  . Acute pulmonary embolism without acute cor pulmonale (HCC)   . Subdural hematoma (HCC) 09/06/2018  . Subdural bleeding (HCC) 09/06/2018    Past Surgical  History:  Procedure Laterality Date  . ABDOMINAL SURGERY    . CATARACT EXTRACTION, BILATERAL    . CHOLECYSTECTOMY    . CRANIOTOMY Bilateral 09/06/2018   Procedure: BILATERAL CRANIOTOMIES FOR SUBDURAL HEMATOMA EVACUATION;  Surgeon: Maeola Harman, MD;  Location: [facility];  Service: Neurosurgery;  Laterality: Bilateral;  . EYE SURGERY         Family History  Problem Relation Age of Onset  . Atrial fibrillation Mother   . Macular degeneration Mother   . Atrial fibrillation Father   . Macular degeneration Father   . Diabetes Brother   . Seizures Brother   . Pancreatic cancer Brother   . Obesity Brother     Social History   Tobacco Use  . Smoking status: Current Every Day Smoker    Packs/day: 1.00    Years: 50.00    Pack years: 50.00    Types: Cigarettes  . Smokeless tobacco: Never Used  Vaping Use  . Vaping Use: Never used  Substance Use Topics  . Alcohol use: Yes  . Drug use: Never    Home Medications Prior to Admission medications   Medication Sig Start Date End Date Taking? Authorizing Provider  AIMOVIG 70 MG/ML SOAJ Inject 1 Syringe into the skin every 30 (thirty) days. 10/25/19   [provider]  apixaban (ELIQUIS) 5 MG TABS tablet  Take 5 mg by mouth 2 (two) times daily.    [provider]  cyclobenzaprine (FLEXERIL) 10 MG tablet Take 10 mg by mouth every 8 (eight) hours as needed. Patient not taking: Reported on 05/24/2020 08/18/19   [provider]  HYDROcodone-acetaminophen (NORCO/VICODIN) 5-325 MG tablet Take 1 tablet by mouth every 6 (six) hours as needed. Patient not taking: Reported on 05/24/2020 08/18/19   [provider]  ondansetron (ZOFRAN) 4 MG tablet Take 4 mg by mouth every 6 (six) hours as needed for nausea or vomiting. Patient not taking: Reported on 05/24/2020    [provider]  Polyethyl Glycol-Propyl Glycol 0.4-0.3 % SOLN Apply 1 drop to eye as needed.  Patient not taking: Reported on 05/24/2020    [provider]  vitamin B-12 (CYANOCOBALAMIN) 1000 MCG tablet Take 1,000 mcg by mouth daily.    [provider]    Allergies    Flucloxacillin  Review of Systems   Review of Systems  All other systems reviewed and are negative.   Physical Exam Updated Vital Signs BP 128/86   Pulse 91   Temp 98.1 F (36.7 C) (Oral)   Resp (!) 22   Ht 1.778 m (5\' 10" )   Wt 66.2 kg   SpO2 93%   BMI 20.95 kg/m   Physical Exam Vitals and nursing note reviewed.  Constitutional:      General: He is not in acute distress.    Appearance: He is well-developed.     Comments: Uncomfortable appearing  HENT:     Head: Normocephalic and atraumatic.     Mouth/Throat:     Pharynx: No oropharyngeal exudate.  Eyes:     General: No scleral icterus.       Right eye: No discharge.        Left eye: No discharge.     Conjunctiva/sclera: Conjunctivae normal.     Pupils: Pupils are equal, round, and reactive to light.  Neck:     Thyroid: No thyromegaly.     Vascular: No JVD.  Cardiovascular:     Rate and Rhythm: Normal rate and regular rhythm.     Heart sounds: Normal heart sounds. No murmur heard.  No friction rub. No gallop.      Comments: Pulses at the bilateral feet are totally normal both dorsalis pedis and posterior tibialis. Pulmonary:     Effort: Pulmonary effort is normal. No respiratory distress.     Breath sounds: Normal breath sounds. No wheezing or rales.  Abdominal:     General: Bowel sounds are normal. There is no distension.     Palpations: Abdomen is soft. There is no mass.     Tenderness: There is no abdominal tenderness.  Musculoskeletal:        General: Swelling and tenderness present. No deformity or signs of injury. Normal range of motion.     Cervical back: Normal range of motion and neck supple.     Right lower leg: Edema present.     Left lower leg: No edema.  Lymphadenopathy:     Cervical: No cervical adenopathy.  Skin:    General: Skin is warm and dry.      Findings: No erythema or rash.     Comments: Slight hyperemia of the right lower extremity below the knee  Neurological:     Mental Status: He is alert.     Coordination: Coordination normal.     Comments: Awake alert and ambulatory, gait is somewhat limited by  pain in the right leg  Psychiatric:        Behavior: Behavior normal.     ED Results / Procedures / Treatments   Labs (all labs ordered are listed, but only abnormal results are displayed) Labs Reviewed  CBC - Abnormal; Notable for the following components:      Result Value   RBC 3.64 (*)    MCV 110.7 (*)    MCH 37.4 (*)    Platelets 68 (*)    All other components within normal limits  COMPREHENSIVE METABOLIC PANEL - Abnormal; Notable for the following components:   Potassium 3.4 (*)    Creatinine, Ser 1.31 (*)    Total Bilirubin 2.5 (*)    GFR, Estimated 57 (*)    All other components within normal limits  PROTIME-INR  TROPONIN I (HIGH SENSITIVITY)    EKG EKG Interpretation  Date/Time:  Sunday September 30 2020 15:28:00 EST Ventricular Rate:  89 PR Interval:    QRS Duration: 98 QT Interval:  369 QTC Calculation: 449 R Axis:   -63 Text Interpretation: Sinus rhythm Left anterior fascicular block Abnormal R-wave progression, late transition since last tracing no significant change Confirmed by Eber Hong ([id number]) on 09/30/2020 3:53:34 PM   Radiology No results found.  Procedures Ultrasound ED DVT  Date/Time: 09/30/2020 4:08 PM Performed by: Eber Hong, MD Authorized by: Eber Hong, MD   Procedure details:    Indications: lower extremity pain and swelling of limb     Assessment for:  DVT   Images Archived: Yes     Limitations:  None RLE Findings:    Right common femoral vein:  Not compressible   Right popliteal vein:  Not compressible LLE Findings:    Left common femoral vein:  Compressible   Left deep and superficial femoral veins:  Compressible IMPRESSION:   DVT:     R common femoral  vein and R popliteal vein Comments:         .Critical Care Performed by: Eber Hong, MD Authorized by: Eber Hong, MD   Critical care provider statement:    Critical care time (minutes):  35   Critical care time was exclusive of:  Separately billable procedures and treating other patients and teaching time   Critical care was necessary to treat or prevent imminent or life-threatening deterioration of the following conditions: Pulmonary Embolism.   Critical care was time spent personally by me on the following activities:  Blood draw for specimens, development of treatment plan with patient or surrogate, discussions with consultants, evaluation of patient's response to treatment, examination of patient, obtaining history from patient or surrogate, ordering and performing treatments and interventions, ordering and review of laboratory studies, ordering and review of radiographic studies, pulse oximetry, re-evaluation of patient's condition and review of old charts   (including critical care time)  Medications Ordered in ED Medications  morphine 4 MG/ML injection 2 mg (2 mg Intravenous Given 09/30/20 1534)  iohexol (OMNIPAQUE) 350 MG/ML injection 75 mL (75 mLs Intravenous Contrast Given 09/30/20 1631)    ED Course  I have reviewed the triage vital signs and the nursing notes.  Pertinent labs & imaging results that were available during my care of the patient were reviewed by me and considered in my medical decision making (see chart for details).    MDM Rules/Calculators/A&P                          At this time the  patient appears to have a recurrent DVT with unilateral leg swelling.  I will do a bedside ultrasound as it is Sunday and I do not have ultrasound services.  He will need a CT scan of the chest as well to look for signs of pulmonary embolism given possible hemoptysis and recurrent pulmonary embolism.  Thankfully he is not hypoxic, he has a borderline tachycardia, he is  afebrile, will give pain medications, check labs and CT scan.  Patient agreeable.  I have thoroughly reviewed the electronic medical record noting his prior pulmonary embolism and prior surgeries  The labs are significant for thrombocytopenia with a platelet count of about 68,000, there is some renal insufficiency,  EKG reviewed, no acute findings, no signs of heart strain  I have personally viewed the CT scan of the chest, there appears to be bilateral pulmonary emboli, no large central pulmonary emboli.  Given this and the large DVT in the right leg with a history of thrombocytopenia the patient would best be served in an inpatient setting, heparin drip started, critically ill from multiple thromboembolism  I discussed this with the radiologist who also agrees that there is bilateral pulmonary embolism without heart strain  D/w Dr. Mariea Clonts who will admit  Final Clinical Impression(s) / ED Diagnoses Final diagnoses:  Acute pulmonary embolism without acute cor pulmonale, unspecified pulmonary embolism type (HCC)  Acute deep vein thrombosis (DVT) of femoral vein of right lower extremity (HCC)  Thrombocytopenia (HCC)  Renal insufficiency      Eber Hong, MD 09/30/20 1704

## 2020-09-30 NOTE — ED Triage Notes (Signed)
Pt to er, pt states that he was on blood thinners, but he fell about a month ago and had a bleed so he was taken off the blood thinners, states that he is here today because he is having pain and swelling in his leg, states that the pain and swelling was gradual, but yesterday it became more intense.

## 2020-09-30 NOTE — H&P (Addendum)
History and Physical    Brian Stark Brian Stark DOB: 09-10-1946 DOA: 09/30/2020  PCP: Benita Stabile, MD   Patient coming from: Home  I have personally briefly reviewed patient's old medical records in [facility]  Chief Complaint: Leg swelling  HPI: Brian Stark is a 74 y.o. male with medical history significant for subdural hematoma, pulmonary embolism and infarction. Patient presented to the ED with complaints of right lower extremity redness and swelling over the past few days, that significantly worsened yesterday.  Reports increasing fatigue with minimal activity, and mild difficulty breathing.  He denies chest pain.  Patient lives alone and is active and able to take care of himself.   He denies recent travel.  No family history of blood clots. Patient reports 3 episodes where he had drainage at the back of his nose over the past 2 days, and thinks he saw a tiny amount of blood from his nose. Patient was diagnosed with pulmonary embolism 10/2019, he was placed on Eliquis and tolerated this well.  Reports in October this year, he called his primary care provider, and since he was doing okay he was told he could come off his Eliquis.  Reports his last dose of Eliquis was 20 October.  ED Course: Temperature 98.1.  Heart rate 80s to 90s, O2 sats greater than 94% on room air.  Respiratory rate 14-24.  Blood pressure systolic 120s to 751W.  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  Platelets low 68.  CTA chest showed acute PE in segmental and subsegmental branches, also findings suggestive of pulmonary hypertension.  Patient was started on heparin drip.  Hospitalist to admit for PE and likely DVT.  Review of Systems: As per HPI all other systems reviewed and negative.  Past Medical History:  Diagnosis Date  . Bursitis    l shoulder  . Cataract   . Elevated bilirubin   . H/O: rheumatic fever   . Kidney stones   . Migraines   . Pulmonary embolism (HCC)   . Rheumatic fever   .  Tendonitis    L shoulder    Past Surgical History:  Procedure Laterality Date  . ABDOMINAL SURGERY    . CATARACT EXTRACTION, BILATERAL    . CHOLECYSTECTOMY    . CRANIOTOMY Bilateral 09/06/2018   Procedure: BILATERAL CRANIOTOMIES FOR SUBDURAL HEMATOMA EVACUATION;  Surgeon: Maeola Harman, MD;  Location: [facility] OR;  Service: Neurosurgery;  Laterality: Bilateral;  . EYE SURGERY       reports that he has been smoking cigarettes. He has a 50.00 pack-year smoking history. He has never used smokeless tobacco. He reports current alcohol use. He reports that he does not use drugs.  Allergies  Allergen Reactions  . Flucloxacillin Anaphylaxis    Family History  Problem Relation Age of Onset  . Atrial fibrillation Mother   . Macular degeneration Mother   . Atrial fibrillation Father   . Macular degeneration Father   . Diabetes Brother   . Seizures Brother   . Pancreatic cancer Brother   . Obesity Brother     Prior to Admission medications   Medication Sig Start Date End Date Taking? Authorizing Provider  AIMOVIG 70 MG/ML SOAJ Inject 1 Syringe into the skin every 30 (thirty) days. 10/25/19   [provider]  apixaban (ELIQUIS) 5 MG TABS tablet Take 5 mg by mouth 2 (two) times daily.    [provider]  cyclobenzaprine (FLEXERIL) 10 MG tablet Take 10 mg by mouth every  8 (eight) hours as needed. Patient not taking: Reported on 05/24/2020 08/18/19   [provider]  HYDROcodone-acetaminophen (NORCO/VICODIN) 5-325 MG tablet Take 1 tablet by mouth every 6 (six) hours as needed. Patient not taking: Reported on 05/24/2020 08/18/19   [provider]  ondansetron (ZOFRAN) 4 MG tablet Take 4 mg by mouth every 6 (six) hours as needed for nausea or vomiting. Patient not taking: Reported on 05/24/2020    [provider]  Polyethyl Glycol-Propyl Glycol 0.4-0.3 % SOLN Apply 1 drop to eye as needed.  Patient not taking: Reported on 05/24/2020    [provider]  vitamin B-12 (CYANOCOBALAMIN) 1000 MCG tablet Take 1,000 mcg by mouth daily.    [provider]    Physical Exam: Vitals:   09/30/20 1312 09/30/20 1528 09/30/20 1530 09/30/20 1600  BP:  134/80 129/77 128/86  Pulse:  86 87 91  Resp:  14 (!) 24 (!) 22  Temp:      TempSrc:      SpO2:  100% 97% 93%  Weight: 66.2 kg     Height: 5\' 10"  (1.778 m)       Constitutional: NAD, calm, comfortable Vitals:   09/30/20 1312 09/30/20 1528 09/30/20 1530 09/30/20 1600  BP:  134/80 129/77 128/86  Pulse:  86 87 91  Resp:  14 (!) 24 (!) 22  Temp:      TempSrc:      SpO2:  100% 97% 93%  Weight: 66.2 kg     Height: 5\' 10"  (1.778 m)      Eyes: PERRL, lids and conjunctivae normal ENMT: Mucous membranes are Dry. Neck: normal, supple, no masses, no thyromegaly Respiratory: clear to auscultation bilaterally,  Normal respiratory effort. No accessory muscle use.  Cardiovascular: Regular rate and rhythm, Trace right extremity edema. 2+ pedal pulses. Abdomen: no tenderness, no masses palpated. No hepatosplenomegaly. Bowel sounds positive.  Musculoskeletal: no clubbing / cyanosis. No joint deformity upper and lower extremities. Good ROM, no contractures. Normal muscle tone.  Trace right lower extremity swelling, with mild erythema compared to left. Skin: no rashes, lesions, ulcers. No induration Neurologic: No apparent cranial formality, moving extremities spontaneously. Psychiatric: Normal judgment and insight. Alert and oriented x 3. Normal mood.   Labs on Admission: I have personally reviewed following labs and imaging studies  CBC: Recent Labs  Lab 09/30/20 1537  WBC 5.9  HGB 13.6  HCT 40.3  MCV 110.7*  PLT 68*   Basic Metabolic Panel: Recent Labs  Lab 09/30/20 1537  NA 136  K 3.4*  CL 103  CO2 27  GLUCOSE 96  BUN 14  CREATININE 1.31*  CALCIUM 8.9   Liver Function Tests: Recent Labs  Lab 09/30/20 1537  AST 20  ALT 12  ALKPHOS 111  BILITOT 2.5*  PROT 6.6    ALBUMIN 3.6   Coagulation Profile: Recent Labs  Lab 09/30/20 1537  INR 1.0    Radiological Exams on Admission: CT Angio Chest PE W and/or Wo Contrast  Result Date: 09/30/2020 CLINICAL DATA:  Shortness of breath. EXAM: CT ANGIOGRAPHY CHEST WITH CONTRAST TECHNIQUE: Multidetector CT imaging of the chest was performed using the standard protocol during bolus administration of intravenous contrast. Multiplanar CT image reconstructions and MIPs were obtained to evaluate the vascular anatomy. CONTRAST:  72mL OMNIPAQUE IOHEXOL 350 MG/ML SOLN COMPARISON:  CT chest dated 10/16/2019 FINDINGS: Cardiovascular: Satisfactory opacification of the pulmonary arteries to the segmental level. Acute pulmonary emboli are seen in segmental and subsegmental branches supplying  the right lower, right middle, left lower, and left upper lobes. The left and right pulmonary arteries measure 2.4 and 2.2 cm respectively, suggestive of pulmonary hypertension. Normal heart size. No pericardial effusion. Mediastinum/Nodes: No enlarged mediastinal, hilar, or axillary lymph nodes. Thyroid gland, trachea, and esophagus demonstrate no significant findings. Lungs/Pleura: There is mild bibasilar atelectasis. No pleural effusion or pneumothorax. Upper Abdomen: No acute abnormality. Musculoskeletal: No chest wall abnormality. No acute or significant osseous findings. Review of the MIP images confirms the above findings. IMPRESSION: 1. Acute pulmonary emboli in segmental and subsegmental branches supplying the right lower, right middle, left lower, and left upper lobes. 2. The left and right pulmonary arteries measure 2.4 and 2.2 cm respectively, suggestive of pulmonary hypertension. These results were called by telephone at the time of interpretation on 09/30/2020 at 5:00 PM to provider [facility] , who verbally acknowledged these results. Electronically Signed   By: Romona Curls M.D.   On: 09/30/2020 17:02    EKG: Independently  reviewed.  Sinus rhythm, rate 89, QTc 449.  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  No significant change from prior.  Assessment/Plan Principal Problem:   PE (pulmonary thromboembolism) (HCC) Active Problems:   Subdural hematoma (HCC)   Thrombocytopenia (HCC)   Pulmonary embolism-appears unprovoked, history of PE 10/2019. O 2 sats > 94 % on room air.   No recent travel surgeries, patient lives alone and is active.  Taken off Eliquis 08/2020, now presenting with recurrent PE.  Obvious hypercoagulable state.  He should remain on anticoagulation indefinitely.  Despite his thrombocytopenia, he has tolerated Eliquis in the recent past.  CT also suggesting pulmonary hypertension. -Heparin drip started, continue -I have explained the risk of anticoagulation with his baseline thrombocytopenia which appears to be mildly worse compared to prior, patient and son verbalized understanding.  Has opted to continue with anticoagulation. -Monitor closely while on anticoagulation -CBC in the morning -Obtain echocardiogram -Right lower extremity ultrasound - N/s + 40 kcl 100cc/hr x 10hrs  Chronic thrombocytopenia-today 68, 04/22/2018, patient's platelets baseline - 80s, occasional increased to 198.  Reports 1-3 beers per week. followed with Dr. Ellin Saba.  Per notes- 05/2020- Work-up included nutritional deficiency, SPEP and hepatitis panel all negative.  Differential diagnosis immune mediated thrombocytopenia versus MDS. -Consider phone consult with Dr. Ellin Saba prior to discharge. -Monitor platelets closely  Hypokalmenia- 3.4. - replete - check amg  Subdural hematoma- 09/2018-patient had bilateral subdural hematomas with brain compression sustained after a fall, requiring emergent bilateral craniotomies and evacuation of subdural hematomas.   DVT prophylaxis: heparin Code Status: Full code Family Communication: Son at bedside Disposition Plan:  ~ 1- 2 days Consults called: None Admission status: Observation,  tele.  Onnie Boer MD [facility]  09/30/2020, 8:38 PM

## 2020-09-30 NOTE — Progress Notes (Signed)
ANTICOAGULATION CONSULT NOTE - Initial Consult  Pharmacy Consult for heparin gtt  Indication: pulmonary embolus  Allergies  Allergen Reactions  . Flucloxacillin Anaphylaxis    Patient Measurements: Height: 5\' 10"  (177.8 cm) Weight: 66.2 kg (146 lb) IBW/kg (Calculated) : 73 Heparin Dosing Weight: HEPARIN DW (KG): 66.2   Vital Signs: Temp: 98.1 F (36.7 C) (11/28 1311) Temp Source: Oral (11/28 1311) BP: 128/86 (11/28 1600) Pulse Rate: 91 (11/28 1600)  Labs: Recent Labs    09/30/20 1537  HGB 13.6  HCT 40.3  PLT 68*  LABPROT 12.8  INR 1.0  CREATININE 1.31*    Estimated Creatinine Clearance: 46.3 mL/min (A) (by C-G formula based on SCr of 1.31 mg/dL (H)).   Medical History: Past Medical History:  Diagnosis Date  . Bursitis    l shoulder  . Cataract   . Elevated bilirubin   . H/O: rheumatic fever   . Kidney stones   . Migraines   . Pulmonary embolism (HCC)   . Rheumatic fever   . Tendonitis    L shoulder    Medications:  (Not in a hospital admission)  Scheduled:  . heparin  4,000 Units Intravenous Once   Infusions:  . heparin     PRN:  Anti-infectives (From admission, onward)   None      Assessment: Brian Stark a 74 y.o. male requires anticoagulation with a heparin iv infusion for the indication of  pulmonary embolus. Heparin gtt will be started following pharmacy protocol per pharmacy consult. Patient is not on previous oral anticoagulant that will require aPTT/HL correlation before transitioning to only HL monitoring.   Goal of Therapy:  Heparin level 0.3-0.7 units/ml Monitor platelets by anticoagulation protocol: Yes   Plan:  Give 4000 units bolus x 1 Start heparin infusion at 1000 units/hr Check anti-Xa level in 8 hours and daily while on heparin Continue to monitor H&H and platelets  Heparin level to be drawn in 8 hours for patients >28 years old or crcl < 30ml/min  31m Meelah Tallo 09/30/2020,5:09 PM

## 2020-09-30 NOTE — ED Notes (Signed)
Hospitalist at bedside 

## 2020-10-01 ENCOUNTER — Observation Stay (HOSPITAL_COMMUNITY): Payer: Medicare Other

## 2020-10-01 DIAGNOSIS — S065X9A Traumatic subdural hemorrhage with loss of consciousness of unspecified duration, initial encounter: Secondary | ICD-10-CM | POA: Diagnosis not present

## 2020-10-01 DIAGNOSIS — D696 Thrombocytopenia, unspecified: Secondary | ICD-10-CM | POA: Diagnosis not present

## 2020-10-01 DIAGNOSIS — I82411 Acute embolism and thrombosis of right femoral vein: Secondary | ICD-10-CM | POA: Insufficient documentation

## 2020-10-01 DIAGNOSIS — I2699 Other pulmonary embolism without acute cor pulmonale: Secondary | ICD-10-CM | POA: Diagnosis not present

## 2020-10-01 LAB — HEPARIN LEVEL (UNFRACTIONATED)
Heparin Unfractionated: 0.2 IU/mL — ABNORMAL LOW (ref 0.30–0.70)
Heparin Unfractionated: 0.25 IU/mL — ABNORMAL LOW (ref 0.30–0.70)

## 2020-10-01 LAB — BASIC METABOLIC PANEL
Anion gap: 5 (ref 5–15)
BUN: 13 mg/dL (ref 8–23)
CO2: 26 mmol/L (ref 22–32)
Calcium: 8.3 mg/dL — ABNORMAL LOW (ref 8.9–10.3)
Chloride: 105 mmol/L (ref 98–111)
Creatinine, Ser: 0.99 mg/dL (ref 0.61–1.24)
GFR, Estimated: >60 mL/min (ref >60)
Glucose, Bld: 116 mg/dL — ABNORMAL HIGH (ref 70–99)
Potassium: 4.1 mmol/L (ref 3.5–5.1)
Sodium: 136 mmol/L (ref 135–145)

## 2020-10-01 LAB — CBC
HCT: 37.9 % — ABNORMAL LOW (ref 39.0–52.0)
Hemoglobin: 12.5 g/dL — ABNORMAL LOW (ref 13.0–17.0)
MCH: 37 pg — ABNORMAL HIGH (ref 26.0–34.0)
MCHC: 33 g/dL (ref 30.0–36.0)
MCV: 112.1 fL — ABNORMAL HIGH (ref 80.0–100.0)
Platelets: 59 10*3/uL — ABNORMAL LOW (ref 150–400)
RBC: 3.38 MIL/uL — ABNORMAL LOW (ref 4.22–5.81)
RDW: 14.5 % (ref 11.5–15.5)
WBC: 4 10*3/uL (ref 4.0–10.5)
nRBC: 0 % (ref 0.0–0.2)

## 2020-10-01 MED ORDER — OXYCODONE HCL 5 MG PO TABS
5.0000 mg | ORAL_TABLET | Freq: Once | ORAL | Status: AC
  Administered 2020-10-01: 5 mg via ORAL
  Filled 2020-10-01: qty 1

## 2020-10-01 MED ORDER — APIXABAN 5 MG PO TABS: 5.0000 mg | ORAL_TABLET | Freq: Two times a day (BID) | ORAL | Status: DC

## 2020-10-01 MED ORDER — HEPARIN BOLUS VIA INFUSION
1000.0000 [IU] | Freq: Once | INTRAVENOUS | Status: AC
  Administered 2020-10-01: 1000 [IU] via INTRAVENOUS

## 2020-10-01 MED ORDER — APIXABAN 5 MG PO TABS
ORAL_TABLET | ORAL | 3 refills | Status: AC
Start: 2020-10-01 — End: ?

## 2020-10-01 MED ORDER — APIXABAN 5 MG PO TABS
10.0000 mg | ORAL_TABLET | Freq: Two times a day (BID) | ORAL | Status: DC
  Administered 2020-10-01: 10 mg via ORAL
  Filled 2020-10-01: qty 2

## 2020-10-01 NOTE — Discharge Summary (Signed)
Physician Discharge Summary  Brian Stark ZJI:[id number] DOB: Mar 23, 1946 DOA: 09/30/2020  PCP: Benita Stabile, MD  Admit date: 09/30/2020 Discharge date: 10/01/2020  Time spent: 30 minutes  Recommendations for Outpatient Follow-up:  1. Repeat CBC to follow hemoglobin and platelet count 2. Repeat basic metabolic panel to follow electrolytes and renal function.   Discharge Diagnoses:  PE (pulmonary thromboembolism) (HCC) Right lower extremity DVT History of Subdural hematoma (HCC) Thrombocytopenia (HCC) Hypokalemia History of migraines.  Discharge Condition: Stable and improved.  Discharged home with instruction to follow-up with hematology/oncology service, vascular surgery and PCP.  CODE STATUS: Full code.  Diet recommendation: Regular diet.  Filed Weights   09/30/20 1312  Weight: 66.2 kg    History of present illness:  As per H&P written by Dr. Mariea Clonts on 09/30/2020 Brian Stark is a 74 y.o. male with medical history significant for subdural hematoma, pulmonary embolism and infarction. Patient presented to the ED with complaints of right lower extremity redness and swelling over the past few days, that significantly worsened yesterday.  Reports increasing fatigue with minimal activity, and mild difficulty breathing.  He denies chest pain.  Patient lives alone and is active and able to take care of himself.   He denies recent travel.  No family history of blood clots. Patient was diagnosed with pulmonary embolism 10/2019, he was placed on Eliquis and tolerated this well.  Reports in October 2021, he called his primary care provider, and since he was doing okay he was told he could come off his Eliquis. Reports his last dose of Eliquis was August 15, 2020.  ED Course: Temperature 98.1.  Heart rate 80s to 90s, O2 sats greater than 94% on room air.  Respiratory rate 14-24.  Blood pressure systolic 120s to 175Z.  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  Platelets low 68.  CTA chest  showed acute PE in segmental and subsegmental branches, also findings suggestive of pulmonary hypertension.  Patient was started on heparin drip.  Hospitalist to admit for PE and likely DVT.  Hospital Course:  1-pulmonary embolism/right lower extremity DVT -Patient will be treated with Eliquis 10 mg twice a day for 7 days as loading dose and subsequently 5 mg twice a day with intention for lifelong therapy. -Patient will follow up with hematology service as an outpatient. -Due to extension of his right lower extremity DVT vascular surgery will also follow-up patient in 1 to 2 weeks to further determine any interventions.  2-chronic thrombocytopenia -Appears to be stable and close to his baseline. -Patient will continue outpatient follow-up with Dr. Ellin Saba (hematology/oncologist) after discharge  3-hypokalemia -Repleted and within normal limits at discharge -Advised to maintain adequate hydration -Repeat basic metabolic panel to follow electrolytes trend.  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 -No recurrent bleeding, neurologic deficits or any complaints.  5-history of migraines -Continue home analgesic regimen.  Procedures: See below for x-ray reports.  Consultations:  Curbside: Dr. Ellin Saba (oncology service) recommended patient to be started on Eliquis with intention to pursued lifelong management.  Also discussed with Dr. Darrick Penna (vascular surgery) due to extensive DVT on his right lower extremity.  Recommendations given to follow-up in the next 1 to 2 weeks as outpatient and no need for thrombolysis therapy at this time.  Discharge Exam: Vitals:   10/01/20 1100 10/01/20 1130  BP: 130/74 127/74  Pulse: 86 80  Resp: 17 (!) 28  Temp:    SpO2: 96% 99%    General:  Afebrile, no nausea, no vomiting, no chest pain and with good oxygen saturation on room air.   Cardiovascular: S1 and S2, no rubs, no gallops, no JVD. Respiratory: Good air movement bilaterally; no using accessory muscles.  No crackles or wheezing on exam. Abdomen: Soft, nontender, distended, positive bowel sounds Extremities: No cyanosis or clubbing; right lower extremity tense and warm to palpation; patient expressing having some pain in his calf and around his knee inner aspect of his thigh.   Discharge Instructions   Discharge Instructions    Discharge instructions   Complete by: As directed    Maintain adequate hydration Avoid the use of NSAIDs as you will be now once again taking blood thinner medications. Arrange follow-up with Dr. Ellin Saba in 10 days Follow-up with vascular surgery as instructed (office will contact you with appointment details).   Increase activity slowly   Complete by: As directed      Allergies as of 10/01/2020      Reactions   Flucloxacillin Anaphylaxis      Medication List    TAKE these medications   Aimovig 70 MG/ML Soaj Generic drug: Erenumab-aooe Inject 1 Syringe into the skin every 30 (thirty) days.   apixaban 5 MG Tabs tablet Commonly known as: ELIQUIS Take 2 tablets by mouth twice a day for 7 days; then start taking 1 tablet by mouth twice a day on daily basis. What changed:   how much to take  how to take this  when to take this  additional instructions   cyclobenzaprine 10 MG tablet Commonly known as: FLEXERIL Take 10 mg by mouth every 8 (eight) hours as needed.   HYDROcodone-acetaminophen 5-325 MG tablet Commonly known as: NORCO/VICODIN Take 1 tablet by mouth every 6 (six) hours as needed.   ondansetron 4 MG tablet Commonly known as: ZOFRAN Take 4 mg by mouth every 6 (six) hours as needed for nausea or vomiting.   Polyethyl Glycol-Propyl Glycol 0.4-0.3 % Soln Apply 1 drop to eye as needed.   tadalafil 5 MG  tablet Commonly known as: CIALIS Take 5 mg by mouth at bedtime.   vitamin B-12 1000 MCG tablet Commonly known as: CYANOCOBALAMIN Take 1,000 mcg by mouth daily.      Allergies  Allergen Reactions  . Flucloxacillin Anaphylaxis    Follow-up Information    Benita Stabile, MD. Schedule an appointment as soon as possible for a visit in 2 week(s).   Specialty: Internal Medicine Contact information: [address] [place] [facility] [address] [phone]        Doreatha Massed, MD. Schedule an appointment as soon as possible for a visit in 10 day(s).   Specialty: Hematology Contact information: [address] [place] [address] [phone]        Sherren Kerns, MD Follow up today.   Specialties: Vascular Surgery, Cardiology Why: Office will contact you with appointment details in the next 1 to 2 weeks. Contact information: [address] [place] [address] [phone]               The results of significant diagnostics from this hospitalization (including imaging, microbiology, ancillary and laboratory) are listed below for reference.    Significant Diagnostic Studies: CT Angio Chest PE W and/or Wo Contrast  Result Date: 09/30/2020 CLINICAL DATA:  Shortness of breath. EXAM: CT ANGIOGRAPHY CHEST WITH CONTRAST TECHNIQUE: Multidetector CT  imaging of the chest was performed using the standard protocol during bolus administration of intravenous contrast. Multiplanar CT image reconstructions and MIPs were obtained to evaluate the vascular anatomy. CONTRAST:  61mL OMNIPAQUE IOHEXOL 350 MG/ML SOLN COMPARISON:  CT chest dated 10/16/2019 FINDINGS: Cardiovascular: Satisfactory opacification of the pulmonary arteries to the segmental level. Acute pulmonary emboli are seen in segmental and subsegmental branches supplying the right lower, right middle, left lower, and left upper lobes. The left and right pulmonary arteries measure 2.4 and 2.2 cm respectively, suggestive of  pulmonary hypertension. Normal heart size. No pericardial effusion. Mediastinum/Nodes: No enlarged mediastinal, hilar, or axillary lymph nodes. Thyroid gland, trachea, and esophagus demonstrate no significant findings. Lungs/Pleura: There is mild bibasilar atelectasis. No pleural effusion or pneumothorax. Upper Abdomen: No acute abnormality. Musculoskeletal: No chest wall abnormality. No acute or significant osseous findings. Review of the MIP images confirms the above findings. IMPRESSION: 1. Acute pulmonary emboli in segmental and subsegmental branches supplying the right lower, right middle, left lower, and left upper lobes. 2. The left and right pulmonary arteries measure 2.4 and 2.2 cm respectively, suggestive of pulmonary hypertension. These results were called by telephone at the time of interpretation on 09/30/2020 at 5:00 PM to provider [facility] , who verbally acknowledged these results. Electronically Signed   By: Romona Curls M.D.   On: 09/30/2020 17:02   US Venous Img Lower Unilateral Right (DVT)  Result Date: 10/01/2020 CLINICAL DATA:  Right lower extremity edema. Patient currently diagnosed with pulmonary embolism. EXAM: RIGHT LOWER EXTREMITY VENOUS DOPPLER ULTRASOUND TECHNIQUE: Gray-scale sonography with compression, as well as color and duplex ultrasound, were performed to evaluate the deep venous system(s) from the level of the common femoral vein through the popliteal and proximal calf veins. COMPARISON:  None. FINDINGS: VENOUS Patent common femoral vein. There is acute occlusive thrombosis in the femoral vein and deep femoral vein extending across the saphenofemoral junction. Veins are noncompressible. Thrombus extends across the popliteal vein into the calf involving posterior tibial and peroneal veins at 1 of 2 paired gastrocnemius veins and both anterior tibial veins. Limited views of the contralateral common femoral vein are unremarkable. OTHER None. Limitations: none IMPRESSION: 1.  Positive exam. Extensive right lower extremity deep venous thrombosis extending throughout the femoral vein and deep femoral vein, through the popliteal vein and into the calf as detailed above. Electronically Signed   By: Amie Portland M.D.   On: 10/01/2020 09:40    Microbiology: Recent Results (from the past 240 hour(s))  Resp Panel by RT-PCR (Flu A&B, Covid) Nasopharyngeal Swab     Status: None   Collection Time: 09/30/20  5:16 PM   Specimen: Nasopharyngeal Swab; Nasopharyngeal(NP) swabs in vial transport medium  Result Value Ref Range Status   SARS Coronavirus 2 by RT PCR NEGATIVE NEGATIVE Final    Comment: (NOTE) SARS-CoV-2 target nucleic acids are NOT DETECTED.  The SARS-CoV-2 RNA is generally detectable in upper respiratory specimens during the acute phase of infection. The lowest concentration of SARS-CoV-2 viral copies this assay can detect is 138 copies/mL. A negative result does not preclude SARS-Cov-2 infection and should not be used as the sole basis for treatment or other patient management decisions. A negative result may occur with  improper specimen collection/handling, submission of specimen other than nasopharyngeal swab, presence of viral mutation(s) within the areas targeted by this assay, and inadequate number of viral copies(<138 copies/mL). A negative result must be combined with clinical observations, patient history, and epidemiological information. The expected result  is Negative.  Fact Sheet for Patients:  BloggerCourse.comhttps://www.fda.gov/media/152166/download  Fact Sheet for Healthcare Providers:  SeriousBroker.ithttps://www.fda.gov/media/152162/download  This test is no t yet approved or cleared by the [place] FDA and  has been authorized for detection and/or diagnosis of SARS-CoV-2 by FDA under an Emergency Use Authorization (EUA). This EUA will remain  in effect (meaning this test can be used) for the duration of the COVID-19 declaration under Section 564(b)(1) of the  Act, 21 U.S.C.section 360bbb-3(b)(1), unless the authorization is terminated  or revoked sooner.       Influenza A by PCR NEGATIVE NEGATIVE Final   Influenza B by PCR NEGATIVE NEGATIVE Final    Comment: (NOTE) The Xpert Xpress SARS-CoV-2/FLU/RSV plus assay is intended as an aid in the diagnosis of influenza from Nasopharyngeal swab specimens and should not be used as a sole basis for treatment. Nasal washings and aspirates are unacceptable for Xpert Xpress SARS-CoV-2/FLU/RSV testing.  Fact Sheet for Patients: BloggerCourse.comhttps://www.fda.gov/media/152166/download  Fact Sheet for Healthcare Providers: SeriousBroker.ithttps://www.fda.gov/media/152162/download  This test is not yet approved or cleared by the [place] FDA and has been authorized for detection and/or diagnosis of SARS-CoV-2 by FDA under an Emergency Use Authorization (EUA). This EUA will remain in effect (meaning this test can be used) for the duration of the COVID-19 declaration under Section 564(b)(1) of the Act, 21 U.S.C. section 360bbb-3(b)(1), unless the authorization is terminated or revoked.  Performed at [facility], [address] [place], [place] [id number]      Labs: Basic Metabolic Panel: Recent Labs  Lab 09/30/20 1537 10/01/20 0956  NA 136 136  K 3.4* 4.1  CL 103 105  CO2 27 26  GLUCOSE 96 116*  BUN 14 13  CREATININE 1.31* 0.99  CALCIUM 8.9 8.3*  MG 1.9  --    Liver Function Tests: Recent Labs  Lab 09/30/20 1537  AST 20  ALT 12  ALKPHOS 111  BILITOT 2.5*  PROT 6.6  ALBUMIN 3.6   CBC: Recent Labs  Lab 09/30/20 1537 10/01/20 0956  WBC 5.9 4.0  HGB 13.6 12.5*  HCT 40.3 37.9*  MCV 110.7* 112.1*  PLT 68* 59*    Signed:  Vassie Lollarlos Helaman Mecca MD.  [facility] 10/01/2020, 12:24 PM

## 2020-10-01 NOTE — Progress Notes (Signed)
ANTICOAGULATION CONSULT NOTE  Pharmacy Consult for apixaban Indication: pulmonary embolus  Allergies  Allergen Reactions  . Flucloxacillin Anaphylaxis    Patient Measurements: Height: 5\' 10"  (177.8 cm) Weight: 66.2 kg (146 lb) IBW/kg (Calculated) : 73 Heparin Dosing Weight: HEPARIN DW (KG): 66.2   Vital Signs: BP: 130/74 (11/29 1100) Pulse Rate: 86 (11/29 1100)  Labs: Recent Labs    09/30/20 1537 10/01/20 0136 10/01/20 0956  HGB 13.6  --  12.5*  HCT 40.3  --  37.9*  PLT 68*  --  59*  LABPROT 12.8  --   --   INR 1.0  --   --   HEPARINUNFRC  --  0.20*  --   CREATININE 1.31*  --  0.99    Estimated Creatinine Clearance: 61.3 mL/min (by C-G formula based on SCr of 0.99 mg/dL).   Assessment: Brian Stark a 74 y.o. male requires anticoagulation with a heparin iv infusion for the indication of  pulmonary embolus. Patient will now be transitioned off heparin infusion to Eliquis.   Patient has history of PE which he was on Eliquis for about a year.  Goal of Therapy:   Monitor platelets by anticoagulation protocol: Yes   Plan:  Stop heparin infusion Start apixaban 10 mg twice daily for 7 days followed by apixaban 5 mg twice daily. Monitor H&H and s/s of bleeding.   66, PharmD Clinical Pharmacist 10/01/2020 11:25 AM

## 2020-10-01 NOTE — Discharge Instructions (Signed)
Information on my medicine - ELIQUIS (apixaban)  This medication education was reviewed with me or my healthcare representative as part of my discharge preparation.  Why was Eliquis prescribed for you? Eliquis was prescribed to treat blood clots that may have been found in the veins of your legs (deep vein thrombosis) or in your lungs (pulmonary embolism) and to reduce the risk of them occurring again.  What do You need to know about Eliquis ? The starting dose is 10 mg (two 5 mg tablets) taken TWICE daily for the FIRST SEVEN (7) DAYS, then on 10/08/2020  the dose is reduced to ONE 5 mg tablet taken TWICE daily.  Eliquis may be taken with or without food.   Try to take the dose about the same time in the morning and in the evening. If you have difficulty swallowing the tablet whole please discuss with your pharmacist how to take the medication safely.  Take Eliquis exactly as prescribed and DO NOT stop taking Eliquis without talking to the doctor who prescribed the medication.  Stopping may increase your risk of developing a new blood clot.  Refill your prescription before you run out.  After discharge, you should have regular check-up appointments with your healthcare provider that is prescribing your Eliquis.    What do you do if you miss a dose? If a dose of ELIQUIS is not taken at the scheduled time, take it as soon as possible on the same day and twice-daily administration should be resumed. The dose should not be doubled to make up for a missed dose.  Important Safety Information A possible side effect of Eliquis is bleeding. You should call your healthcare provider right away if you experience any of the following: ? Bleeding from an injury or your nose that does not stop. ? Unusual colored urine (red or dark brown) or unusual colored stools (red or black). ? Unusual bruising for unknown reasons. ? A serious fall or if you hit your head (even if there is no bleeding).  Some  medicines may interact with Eliquis and might increase your risk of bleeding or clotting while on Eliquis. To help avoid this, consult your healthcare provider or pharmacist prior to using any new prescription or non-prescription medications, including herbals, vitamins, non-steroidal anti-inflammatory drugs (NSAIDs) and supplements.  This website has more information on Eliquis (apixaban): http://www.eliquis.com/eliquis/home

## 2020-10-01 NOTE — Progress Notes (Signed)
ANTICOAGULATION CONSULT NOTE  Pharmacy Consult for heparin gtt  Indication: pulmonary embolus  Allergies  Allergen Reactions  . Flucloxacillin Anaphylaxis    Patient Measurements: Height: 5\' 10"  (177.8 cm) Weight: 66.2 kg (146 lb) IBW/kg (Calculated) : 73 Heparin Dosing Weight: HEPARIN DW (KG): 66.2   Vital Signs: BP: 119/71 (11/29 0230) Pulse Rate: 79 (11/29 0230)  Labs: Recent Labs    09/30/20 1537 10/01/20 0136  HGB 13.6  --   HCT 40.3  --   PLT 68*  --   LABPROT 12.8  --   INR 1.0  --   HEPARINUNFRC  --  0.20*  CREATININE 1.31*  --     Estimated Creatinine Clearance: 46.3 mL/min (A) (by C-G formula based on SCr of 1.31 mg/dL (H)).   Assessment: Brian Stark a 74 y.o. male requires anticoagulation with a heparin iv infusion for the indication of  pulmonary embolus. Heparin gtt will be started following pharmacy protocol per pharmacy consult. Patient is not on previous oral anticoagulant that will require aPTT/HL correlation before transitioning to only HL monitoring.   Heparin level subtherapeutic (0.2) on gtt at 1000 units/hr. No issues with line or bleeding reported per RN. Noted baseline plt 68 (chronically <100 over past 6 months).  Goal of Therapy:  Heparin level 0.3-0.7 units/ml Monitor platelets by anticoagulation protocol: Yes   Plan:  Rebolus 1000 units  Increase gtt to 1150 units/hr F/u 8 hr heparin level  66, PharmD, BCPS Please see amion for complete clinical pharmacist phone list 10/01/2020,2:37 AM

## 2020-10-05 ENCOUNTER — Other Ambulatory Visit: Payer: Self-pay

## 2020-10-05 DIAGNOSIS — I82411 Acute embolism and thrombosis of right femoral vein: Secondary | ICD-10-CM

## 2020-10-12 ENCOUNTER — Other Ambulatory Visit: Payer: Self-pay

## 2020-10-12 ENCOUNTER — Ambulatory Visit (INDEPENDENT_AMBULATORY_CARE_PROVIDER_SITE_OTHER): Payer: Medicare Other | Admitting: Vascular Surgery

## 2020-10-12 ENCOUNTER — Ambulatory Visit (HOSPITAL_COMMUNITY)
Admission: RE | Admit: 2020-10-12 | Discharge: 2020-10-12 | Disposition: A | Discharge status: Home / Self Care | Payer: Medicare Other | Source: Ambulatory Visit | Attending: Vascular Surgery | Admitting: Vascular Surgery

## 2020-10-12 ENCOUNTER — Encounter: Payer: Self-pay | Admitting: Vascular Surgery

## 2020-10-12 VITALS — BP 132/84 | HR 85 | Temp 98.3°F | Resp 20 | Ht 70.0 in | Wt 145.0 lb

## 2020-10-12 DIAGNOSIS — I82411 Acute embolism and thrombosis of right femoral vein: Secondary | ICD-10-CM

## 2020-10-12 NOTE — Progress Notes (Signed)
Patient ID: Brian Stark, male   DOB: 1946/05/23, 74 y.o.   MRN: [id number]  Reason for Consult: Follow-up   Referred by Benita Stabile, MD  Subjective:     HPI:  Brian Stark is a 74 y.o. male history of PE in 2020 more recently extensive right lower extremity DVT.  He had been on Eliquis for almost 1 year stopped for 6 weeks when he developed during swelling in his right lower extremity that was painful.  Since being restarted on Eliquis the pain and swelling have fully resolved.  He is ambulatory with the help of a cane.  Prior to thousand 20 had no previous blood clots did not have a family history of blood clotting does not have any history of cancer and does remain at his normal level of activity.  He really has no complaints related to today's visit.  He has questions related to [facility] booster vaccine.  Past Medical History:  Diagnosis Date  . Bursitis    l shoulder  . Cataract   . Elevated bilirubin   . H/O: rheumatic fever   . Kidney stones   . Migraines   . Pulmonary embolism (HCC)   . Rheumatic fever   . Tendonitis    L shoulder   Family History  Problem Relation Age of Onset  . Atrial fibrillation Mother   . Macular degeneration Mother   . Atrial fibrillation Father   . Macular degeneration Father   . Diabetes Brother   . Seizures Brother   . Pancreatic cancer Brother   . Obesity Brother    Past Surgical History:  Procedure Laterality Date  . ABDOMINAL SURGERY    . CATARACT EXTRACTION, BILATERAL    . CHOLECYSTECTOMY    . CRANIOTOMY Bilateral 09/06/2018   Procedure: BILATERAL CRANIOTOMIES FOR SUBDURAL HEMATOMA EVACUATION;  Surgeon: Maeola Harman, MD;  Location: [facility];  Service: Neurosurgery;  Laterality: Bilateral;  . EYE SURGERY      Short Social History:  Social History   Tobacco Use  . Smoking status: Current Every Day Smoker    Packs/day: 1.00    Years: 50.00    Pack years: 50.00    Types: Cigarettes  . Smokeless tobacco: Never Used   Substance Use Topics  . Alcohol use: Yes    Allergies  Allergen Reactions  . Flucloxacillin Anaphylaxis    Current Outpatient Medications  Medication Sig Dispense Refill  . AIMOVIG 70 MG/ML SOAJ Inject 1 Syringe into the skin every 30 (thirty) days.    Marland Kitchen apixaban (ELIQUIS) 5 MG TABS tablet Take 2 tablets by mouth twice a day for 7 days; then start taking 1 tablet by mouth twice a day on daily basis. 60 tablet 3  . cyclobenzaprine (FLEXERIL) 10 MG tablet Take 10 mg by mouth every 8 (eight) hours as needed.     Marland Kitchen HYDROcodone-acetaminophen (NORCO/VICODIN) 5-325 MG tablet Take 1 tablet by mouth every 6 (six) hours as needed.     . ondansetron (ZOFRAN) 4 MG tablet Take 4 mg by mouth every 6 (six) hours as needed for nausea or vomiting.     . tadalafil (CIALIS) 5 MG tablet Take 5 mg by mouth at bedtime.    . vitamin B-12 (CYANOCOBALAMIN) 1000 MCG tablet Take 1,000 mcg by mouth daily.    Bertram Gala Glycol-Propyl Glycol 0.4-0.3 % SOLN Apply 1 drop to eye as needed.  (Patient not taking: Reported on 10/12/2020)     No current facility-administered medications for  this visit.    Review of Systems  Constitutional:  Constitutional negative. HENT: HENT negative.  Eyes: Eyes negative.  Respiratory:       Coughing up minimal amounts of blood Cardiovascular: Cardiovascular negative.  GI: Gastrointestinal negative.  Musculoskeletal: Musculoskeletal negative.  Skin: Skin negative.  Neurological: Positive for dizziness.  Hematologic: Positive for bruises/bleeds easily.       Clots easily Psychiatric: Psychiatric negative.        Objective:  Objective   Vitals:   10/12/20 0842  BP: 132/84  Pulse: 85  Resp: 20  Temp: 98.3 F (36.8 C)  SpO2: 96%  Weight: 145 lb (65.8 kg)  Height: 5\' 10"  (1.778 m)   Body mass index is 20.81 kg/m.  Physical Exam HENT:     Head: Normocephalic.     Nose:     Comments: Wearing mask Eyes:     Pupils: Pupils are equal, round, and reactive to  light.  Cardiovascular:     Rate and Rhythm: Normal rate.     Pulses:          Radial pulses are 2+ on the right side and 2+ on the left side.       Popliteal pulses are 2+ on the right side and 2+ on the left side.  Pulmonary:     Effort: Pulmonary effort is normal.  Abdominal:     General: Abdomen is flat.     Palpations: Abdomen is soft.  Musculoskeletal:        General: No swelling.  Skin:    General: Skin is warm and dry.     Capillary Refill: Capillary refill takes less than 2 seconds.  Neurological:     General: No focal deficit present.     Mental Status: He is alert.  Psychiatric:        Mood and Affect: Mood normal.        Behavior: Behavior normal.        Thought Content: Thought content normal.        Judgment: Judgment normal.     Data: I have independent interpreted his IVC iliac duplex which demonstrates no evidence of thrombus in either his IVC or bilateral common and external iliac veins.     Assessment/Plan:     74 year old male with a second incidence of thrombosis was recently with extensive right lower extremity DVT.  IVC iliac veins without any thrombus or obstruction he has no residual swelling in the right lower extremity.  At this time he probably merits Eliquis indefinitely and if he has issues with bleeding or require surgery he would certainly be a candidate for IVC filter given to recent severe instances of clotting with pulmonary embolism and significant clot burden in his right lower extremity more recently.  He will otherwise follow-up with 66 on an as-needed basis.     [place] MD [facility]

## 2020-11-06 ENCOUNTER — Inpatient Hospital Stay (HOSPITAL_COMMUNITY): Payer: Medicare Other | Attending: Hematology | Admitting: Hematology

## 2020-11-06 ENCOUNTER — Other Ambulatory Visit: Payer: Self-pay

## 2020-11-06 ENCOUNTER — Inpatient Hospital Stay (HOSPITAL_COMMUNITY): Payer: Medicare Other

## 2020-11-06 VITALS — BP 124/79 | HR 89 | Temp 97.2°F | Resp 17 | Wt 142.4 lb

## 2020-11-06 DIAGNOSIS — Z7901 Long term (current) use of anticoagulants: Secondary | ICD-10-CM | POA: Diagnosis not present

## 2020-11-06 DIAGNOSIS — D696 Thrombocytopenia, unspecified: Secondary | ICD-10-CM | POA: Diagnosis present

## 2020-11-06 DIAGNOSIS — I2699 Other pulmonary embolism without acute cor pulmonale: Secondary | ICD-10-CM | POA: Insufficient documentation

## 2020-11-06 DIAGNOSIS — D7589 Other specified diseases of blood and blood-forming organs: Secondary | ICD-10-CM | POA: Insufficient documentation

## 2020-11-06 DIAGNOSIS — I2694 Multiple subsegmental pulmonary emboli without acute cor pulmonale: Secondary | ICD-10-CM

## 2020-11-06 DIAGNOSIS — I82431 Acute embolism and thrombosis of right popliteal vein: Secondary | ICD-10-CM

## 2020-11-06 LAB — CBC WITH DIFFERENTIAL/PLATELET
Abs Immature Granulocytes: 0.02 10*3/uL (ref 0.00–0.07)
Basophils Absolute: 0 10*3/uL (ref 0.0–0.1)
Basophils Relative: 0 %
Eosinophils Absolute: 0 10*3/uL (ref 0.0–0.5)
Eosinophils Relative: 0 %
HCT: 39.4 % (ref 39.0–52.0)
Hemoglobin: 13.1 g/dL (ref 13.0–17.0)
Immature Granulocytes: 0 %
Lymphocytes Relative: 13 %
Lymphs Abs: 0.6 10*3/uL — ABNORMAL LOW (ref 0.7–4.0)
MCH: 36.3 pg — ABNORMAL HIGH (ref 26.0–34.0)
MCHC: 33.2 g/dL (ref 30.0–36.0)
MCV: 109.1 fL — ABNORMAL HIGH (ref 80.0–100.0)
Monocytes Absolute: 0.5 10*3/uL (ref 0.1–1.0)
Monocytes Relative: 10 %
Neutro Abs: 3.6 10*3/uL (ref 1.7–7.7)
Neutrophils Relative %: 77 %
Platelets: 83 10*3/uL — ABNORMAL LOW (ref 150–400)
RBC: 3.61 MIL/uL — ABNORMAL LOW (ref 4.22–5.81)
RDW: 13.8 % (ref 11.5–15.5)
WBC: 4.8 10*3/uL (ref 4.0–10.5)
nRBC: 0 % (ref 0.0–0.2)

## 2020-11-06 LAB — HEPATIC FUNCTION PANEL
ALT: 17 U/L (ref 0–44)
AST: 27 U/L (ref 15–41)
Albumin: 3.7 g/dL (ref 3.5–5.0)
Alkaline Phosphatase: 91 U/L (ref 38–126)
Bilirubin, Direct: 0.5 mg/dL — ABNORMAL HIGH (ref 0.0–0.2)
Indirect Bilirubin: 1.7 mg/dL — ABNORMAL HIGH (ref 0.3–0.9)
Total Bilirubin: 2.2 mg/dL — ABNORMAL HIGH (ref 0.3–1.2)
Total Protein: 6.8 g/dL (ref 6.5–8.1)

## 2020-11-06 LAB — IRON AND TIBC
Iron: 218 ug/dL — ABNORMAL HIGH (ref 45–182)
Saturation Ratios: 72 % — ABNORMAL HIGH (ref 17.9–39.5)
TIBC: 302 ug/dL (ref 250–450)
UIBC: 84 ug/dL

## 2020-11-06 LAB — FOLATE: Folate: 7.8 ng/mL (ref >5.9)

## 2020-11-06 LAB — FERRITIN: Ferritin: 111 ng/mL (ref 24–336)

## 2020-11-06 LAB — VITAMIN B12: Vitamin B-12: 550 pg/mL (ref 180–914)

## 2020-11-06 LAB — LACTATE DEHYDROGENASE: LDH: 113 U/L (ref 98–192)

## 2020-11-06 NOTE — Progress Notes (Signed)
[facility] [address], [place] [address]   CLINIC:  Medical Oncology/Hematology  PCP:  Benita Stabile, MD [address] Brian Stark [place] [address]  [phone]  REASON FOR VISIT:  Hospital follow-up for DVT of right leg and pulmonary embolism  PRIOR THERAPY: None  CURRENT THERAPY: Eliquis 5 mg QD  INTERVAL HISTORY:  [name], a 75 y.o. male, returns for hospital follow-up for his pulmonary embolism. Neco was last seen on 05/24/2020.  Today he reports feeling okay. He went to [facility] on 09/30/2020 for increasing pain and swelling in his right leg and slight SOB; he denies being bed-ridden when the symptoms started. He stopped taking Eliquis on 08/15/2020 per his PCP. He tolerated Eliquis well and denies having nosebleeds, hematuria or hematochezia. He denies having pleuritic CP, SOB or lower extremity pain. He continues having daily headaches, alternating in severity, even though he continues receiving Aimovig injections with his PCP.   REVIEW OF SYSTEMS:  Review of Systems  Constitutional: Positive for appetite change (75%) and fatigue (50%).  HENT:   Negative for nosebleeds.   Respiratory: Positive for shortness of breath.   Cardiovascular: Negative for chest pain.  Gastrointestinal: Positive for constipation. Negative for blood in stool.  Genitourinary: Negative for hematuria.   Musculoskeletal: Negative for myalgias.  Neurological: Positive for dizziness and headaches (alternating in severity daily).  Psychiatric/Behavioral: Positive for sleep disturbance.  All other systems reviewed and are negative.   PAST MEDICAL/SURGICAL HISTORY:  Past Medical History:  Diagnosis Date  . Bursitis    l shoulder  . Cataract   . Elevated bilirubin   . H/O: rheumatic fever   . Kidney stones   . Migraines   . Pulmonary embolism (HCC)   . Rheumatic fever   . Tendonitis    L shoulder   Past Surgical History:  Procedure Laterality Date  .  ABDOMINAL SURGERY    . CATARACT EXTRACTION, BILATERAL    . CHOLECYSTECTOMY    . CRANIOTOMY Bilateral 09/06/2018   Procedure: BILATERAL CRANIOTOMIES FOR SUBDURAL HEMATOMA EVACUATION;  Surgeon: Maeola Harman, MD;  Location: [facility] OR;  Service: Neurosurgery;  Laterality: Bilateral;  . EYE SURGERY      SOCIAL HISTORY:  Social History   Socioeconomic History  . Marital status: Widowed    Spouse name: Not on file  . Number of children: Not on file  . Years of education: Not on file  . Highest education level: Not on file  Occupational History  . Occupation: retired  Tobacco Use  . Smoking status: Current Every Day Smoker    Packs/day: 1.00    Years: 50.00    Pack years: 50.00    Types: Cigarettes  . Smokeless tobacco: Never Used  Vaping Use  . Vaping Use: Never used  Substance and Sexual Activity  . Alcohol use: Yes  . Drug use: Never  . Sexual activity: Not Currently  Other Topics Concern  . Not on file  Social History Narrative  . Not on file   Social Determinants of Health   Financial Resource Strain: Not on file  Food Insecurity: Not on file  Transportation Needs: Not on file  Physical Activity: Not on file  Stress: Not on file  Social Connections: Not on file  Intimate Partner Violence: Not on file    FAMILY HISTORY:  Family History  Problem Relation Age of Onset  . Atrial fibrillation Mother   . Macular degeneration Mother   .  Atrial fibrillation Father   . Macular degeneration Father   . Diabetes Brother   . Seizures Brother   . Pancreatic cancer Brother   . Obesity Brother     CURRENT MEDICATIONS:  Current Outpatient Medications  Medication Sig Dispense Refill  . AIMOVIG 70 MG/ML SOAJ Inject 1 Syringe into the skin every 30 (thirty) days.    Marland Kitchen apixaban (ELIQUIS) 5 MG TABS tablet Take 2 tablets by mouth twice a day for 7 days; then start taking 1 tablet by mouth twice a day on daily basis. 60 tablet 3  . cyclobenzaprine (FLEXERIL) 10 MG tablet Take 10 mg  by mouth every 8 (eight) hours as needed.     Marland Kitchen HYDROcodone-acetaminophen (NORCO/VICODIN) 5-325 MG tablet Take 1 tablet by mouth every 6 (six) hours as needed.     . ondansetron (ZOFRAN) 4 MG tablet Take 4 mg by mouth every 6 (six) hours as needed for nausea or vomiting.     Vladimir Faster Glycol-Propyl Glycol 0.4-0.3 % SOLN Apply 1 drop to eye as needed.    . tadalafil (CIALIS) 5 MG tablet Take 5 mg by mouth at bedtime.    . vitamin B-12 (CYANOCOBALAMIN) 1000 MCG tablet Take 1,000 mcg by mouth daily.     No current facility-administered medications for this visit.    ALLERGIES:  Allergies  Allergen Reactions  . Flucloxacillin Anaphylaxis    PHYSICAL EXAM:  Performance status (ECOG): 1 - Symptomatic but completely ambulatory  Vitals:   11/06/20 1124  BP: 124/79  Pulse: 89  Resp: 17  Temp: (!) 97.2 F (36.2 C)  SpO2: 99%   Wt Readings from Last 3 Encounters:  11/06/20 142 lb 6.4 oz (64.6 kg)  10/12/20 145 lb (65.8 kg)  09/30/20 146 lb (66.2 kg)   Physical Exam  LABORATORY DATA:  I have reviewed the labs as listed.  CBC Latest Ref Rng & Units 10/01/2020 09/30/2020 05/17/2020  WBC 4.0 - 10.5 K/uL 4.0 5.9 4.1  Hemoglobin 13.0 - 17.0 g/dL 12.5(L) 13.6 14.4  Hematocrit 39.0 - 52.0 % 37.9(L) 40.3 43.3  Platelets 150 - 400 K/uL 59(L) 68(L) 84(L)   CMP Latest Ref Rng & Units 10/01/2020 09/30/2020 05/17/2020  Glucose 70 - 99 mg/dL 116(H) 96 110(H)  BUN 8 - 23 mg/dL 13 14 9   Creatinine 0.61 - 1.24 mg/dL 0.99 1.31(H) 1.35(H)  Sodium 135 - 145 mmol/L 136 136 138  Potassium 3.5 - 5.1 mmol/L 4.1 3.4(L) 3.7  Chloride 98 - 111 mmol/L 105 103 104  CO2 22 - 32 mmol/L 26 27 28   Calcium 8.9 - 10.3 mg/dL 8.3(L) 8.9 8.9  Total Protein 6.5 - 8.1 g/dL - 6.6 6.9  Total Bilirubin 0.3 - 1.2 mg/dL - 2.5(H) 1.8(H)  Alkaline Phos 38 - 126 U/L - 111 84  AST 15 - 41 U/L - 20 25  ALT 0 - 44 U/L - 12 19      Component Value Date/Time   RBC 3.38 (L) 10/01/2020 0956   MCV 112.1 (H) 10/01/2020 0956    MCH 37.0 (H) 10/01/2020 0956   MCHC 33.0 10/01/2020 0956   RDW 14.5 10/01/2020 0956   LYMPHSABS 1.2 05/17/2020 1322   MONOABS 0.5 05/17/2020 1322   EOSABS 0.1 05/17/2020 1322   BASOSABS 0.0 05/17/2020 1322    DIAGNOSTIC IMAGING:  I have independently reviewed the scans and discussed with the patient. VAS [place] IVC/ILIAC (VENOUS ONLY)  Result Date: 10/12/2020 IVC/ILIAC STUDY Indications: Evaluation of iliac veins, right leg DVT Other  Factors: PE and extensive DVT involving the right lower extremity                09/30/2020.  Performing Technologist: Dorthula Matas RVS, RCS  Examination Guidelines: A complete evaluation includes B-mode imaging, spectral Doppler, color Doppler, and power Doppler as needed of all accessible portions of each vessel. Bilateral testing is considered an integral part of a complete examination. Limited examinations for reoccurring indications may be performed as noted.  IVC/Iliac Findings: +----------+------+--------+--------+    IVC    PatentThrombusComments +----------+------+--------+--------+ IVC Mid   patent                 +----------+------+--------+--------+ IVC Distalpatent                 +----------+------+--------+--------+  +-------------------+---------+-----------+---------+-----------+--------+         CIV        RT-PatentRT-ThrombusLT-PatentLT-ThrombusComments +-------------------+---------+-----------+---------+-----------+--------+ Common Iliac Prox   patent              patent                      +-------------------+---------+-----------+---------+-----------+--------+ Common Iliac Mid    patent              patent                      +-------------------+---------+-----------+---------+-----------+--------+ Common Iliac Distal patent              patent                      +-------------------+---------+-----------+---------+-----------+--------+   +-------------------------+---------+-----------+---------+-----------+--------+            EIV           RT-PatentRT-ThrombusLT-PatentLT-ThrombusComments +-------------------------+---------+-----------+---------+-----------+--------+ External Iliac Vein Prox  patent              patent                      +-------------------------+---------+-----------+---------+-----------+--------+ External Iliac Vein Mid   patent              patent                      +-------------------------+---------+-----------+---------+-----------+--------+ External Iliac Vein       patent              patent                      Distal                                                                    +-------------------------+---------+-----------+---------+-----------+--------+   Summary: IVC/Iliac: No evidence of thrombus in IVC and Iliac veins.  *See table(s) above for measurements and observations.  Electronically signed by Lemar Livings MD on 10/12/2020 at 9:06:45 AM.    Final      ASSESSMENT:  1. Moderate thrombocytopenia: -Moderate thrombocytopenia with platelet count between 81-1 15 since November 2019. -CTAP on 09/19/2019 showed normal spleen and liver. -Nutritional deficiency work-up, SPEP and hepatitis panel negative. -Differential diagnosis includes immune mediated thrombocytopenia versus MDS.  2. Unprovoked bilateral pulmonary embolism: -Hospital admission from 10/15/2019 through 10/17/2019 with hemoptysis. -  CT chest on 10/16/2019 showed acute bilateral pulmonary emboli greater within the left lower lobe with no evidence of right heart strain.  Dopplers showed no evidence of DVT. -Eliquis was apparently discontinued in October 2021. -CT angio on 09/30/2020 shows acute PE in segmental and subsegmental branches supplying the right lower, right middle and left lower and left upper lobes. -He was started back on Eliquis.  3.Macrocytosis: -High MCV with normal  hemoglobin.  B12 and folic acid was normal. -Differential includes early myelodysplasia.   PLAN:  1. Moderate thrombocytopenia: -Most recent CBC on 10/01/2020 showed platelet count 59. -We will check platelet count today.  We will check for nutritional deficiencies including B12, folic acid, methylmalonic acid, copper levels.  We will also check SPEP. -We will schedule phone visit to discuss results and further plan.  2. Unprovoked bilateral pulmonary embolism: -His Eliquis was reportedly discontinued in October. -He was hospitalized in November with recurrent PE. -He is back on Eliquis.  He will continue with indefinitely.  3. Macrocytic anemia: -Recent CBC shows hemoglobin 12.5 with MCV 112. -We will check ferritin iron panel and other nutritional factors.  Orders placed this encounter:  Orders Placed This Encounter  Procedures  . CBC with Differential  . Vitamin B12  . Lactate dehydrogenase  . Folate  . Methylmalonic acid, serum  . Copper, serum  . Protein electrophoresis, serum  . Ferritin  . Iron and TIBC  . Hepatic function panel     Doreatha Massed, MD [facility] [phone]   I, Drue Second, am acting as a scribe for Dr. Payton Mccallum.  I, Doreatha Massed MD, have reviewed the above documentation for accuracy and completeness, and I agree with the above.

## 2020-11-06 NOTE — Patient Instructions (Signed)
[facility] Discharge Instructions  You were seen today by Dr. Ellin Saba. He went over your recent results. You had labs done today for further analysis. You will need to continue taking Eliquis indefinitely. Dr. Ellin Saba will call you by phone in 10 days for follow up.   Thank you for choosing [facility] to provide your oncology and hematology care.  To afford each patient quality time with our provider, please arrive at least 15 minutes before your scheduled appointment time.   If you have a lab appointment with the Cancer Center please come in thru the Main Entrance and check in at the main information desk  You need to re-schedule your appointment should you arrive 10 or more minutes late.  We strive to give you quality time with our providers, and arriving late affects you and other patients whose appointments are after yours.  Also, if you no show three or more times for appointments you may be dismissed from the clinic at the providers discretion.     Again, thank you for choosing [facility].  Our hope is that these requests will decrease the amount of time that you wait before being seen by our physicians.       _____________________________________________________________  Should you have questions after your visit to [facility], please contact our office at [phone] between the hours of 8:00 a.m. and 4:30 p.m.  Voicemails left after 4:00 p.m. will not be returned until the following business day.  For prescription refill requests, have your pharmacy contact our office and allow 72 hours.    Cancer Center Support Programs:   > Cancer Support Group  2nd Tuesday of the month 1pm-2pm, Journey Room

## 2020-11-07 LAB — PROTEIN ELECTROPHORESIS, SERUM
A/G Ratio: 1.3 (ref 0.7–1.7)
Albumin ELP: 3.4 g/dL (ref 2.9–4.4)
Alpha-1-Globulin: 0.2 g/dL (ref 0.0–0.4)
Alpha-2-Globulin: 0.4 g/dL (ref 0.4–1.0)
Beta Globulin: 0.8 g/dL (ref 0.7–1.3)
Gamma Globulin: 1.1 g/dL (ref 0.4–1.8)
Globulin, Total: 2.6 g/dL (ref 2.2–3.9)
Total Protein ELP: 6 g/dL (ref 6.0–8.5)

## 2020-11-07 LAB — COPPER, SERUM: Copper: 100 ug/dL (ref 69–132)

## 2020-11-20 ENCOUNTER — Other Ambulatory Visit: Payer: Self-pay

## 2020-11-20 ENCOUNTER — Inpatient Hospital Stay (HOSPITAL_BASED_OUTPATIENT_CLINIC_OR_DEPARTMENT_OTHER): Payer: Medicare Other | Admitting: Hematology

## 2020-11-20 DIAGNOSIS — D696 Thrombocytopenia, unspecified: Secondary | ICD-10-CM

## 2020-11-20 NOTE — Progress Notes (Signed)
Virtual Visit via Telephone Note  I connected with Brian Stark on 11/20/20 at  4:15 PM EST by telephone and verified that I am speaking with the correct person using two identifiers.  Location: Patient: At home Provider: In the office   I discussed the limitations, risks, security and privacy concerns of performing an evaluation and management service by telephone and the availability of in person appointments. I also discussed with the patient that there may be a patient responsible charge related to this service. The patient expressed understanding and agreed to proceed.   History of Present Illness: He was seen in our office for mild thrombocytopenia since November 2019.  He also had unprovoked bilateral pulmonary embolism on 10/16/2019 and is currently on Eliquis which was started back after recurrent pulmonary embolism on 09/30/2020.   Observations/Objective: He is tolerating Eliquis very well.  Denies any bleeding per rectum or melena.  Chronic constipation is stable.  Chronic headaches are also stable.  Appetite and energy levels are 50%.  Assessment and Plan:  1.  Moderate thrombocytopenia: - Platelet count on 11/06/2020 improved to 83 from 59 on 10/01/2020. - SPEP was negative.  B12, methylmalonic acid, copper and folic acid levels were normal. - We will plan follow-up in 4 months with repeat labs.  2.  Unprovoked bilateral pulmonary embolism: - He had recurrent unprovoked PE in November 2021. - Continue Eliquis indefinitely.  3.  Macrocytic anemia: - Nutritional deficiency work-up was negative. - Hemoglobin was 13.1 with MCV 109. - We will closely monitor.  4.  Indirect hyperbilirubinemia: - Total bilirubin was 2.2 and elevated since mid part of 2021.  No clear etiology. - We will continue to monitor closely.   Follow Up Instructions: RTC 4 months with repeat labs.   I discussed the assessment and treatment plan with the patient. The patient was provided an  opportunity to ask questions and all were answered. The patient agreed with the plan and demonstrated an understanding of the instructions.   The patient was advised to call back or seek an in-person evaluation if the symptoms worsen or if the condition fails to improve as anticipated.  I provided 11 minutes of non-face-to-face time during this encounter.   Doreatha Massed, MD

## 2020-12-05 ENCOUNTER — Other Ambulatory Visit (HOSPITAL_COMMUNITY): Payer: Medicare Other

## 2020-12-12 ENCOUNTER — Ambulatory Visit (HOSPITAL_COMMUNITY): Payer: Medicare Other | Admitting: Hematology

## 2021-03-20 ENCOUNTER — Inpatient Hospital Stay (HOSPITAL_COMMUNITY): Payer: Medicare Other | Attending: Hematology

## 2021-03-20 ENCOUNTER — Other Ambulatory Visit: Payer: Self-pay

## 2021-03-20 DIAGNOSIS — D696 Thrombocytopenia, unspecified: Secondary | ICD-10-CM | POA: Diagnosis not present

## 2021-03-20 DIAGNOSIS — I2699 Other pulmonary embolism without acute cor pulmonale: Secondary | ICD-10-CM | POA: Insufficient documentation

## 2021-03-20 DIAGNOSIS — D7589 Other specified diseases of blood and blood-forming organs: Secondary | ICD-10-CM | POA: Insufficient documentation

## 2021-03-20 DIAGNOSIS — N189 Chronic kidney disease, unspecified: Secondary | ICD-10-CM | POA: Insufficient documentation

## 2021-03-20 DIAGNOSIS — Z7901 Long term (current) use of anticoagulants: Secondary | ICD-10-CM | POA: Diagnosis not present

## 2021-03-20 LAB — CBC WITH DIFFERENTIAL/PLATELET
Abs Immature Granulocytes: 0.01 10*3/uL (ref 0.00–0.07)
Basophils Absolute: 0 10*3/uL (ref 0.0–0.1)
Basophils Relative: 1 %
Eosinophils Absolute: 0.1 10*3/uL (ref 0.0–0.5)
Eosinophils Relative: 3 %
HCT: 40.4 % (ref 39.0–52.0)
Hemoglobin: 13.7 g/dL (ref 13.0–17.0)
Immature Granulocytes: 0 %
Lymphocytes Relative: 38 %
Lymphs Abs: 1.4 10*3/uL (ref 0.7–4.0)
MCH: 36.9 pg — ABNORMAL HIGH (ref 26.0–34.0)
MCHC: 33.9 g/dL (ref 30.0–36.0)
MCV: 108.9 fL — ABNORMAL HIGH (ref 80.0–100.0)
Monocytes Absolute: 0.4 10*3/uL (ref 0.1–1.0)
Monocytes Relative: 11 %
Neutro Abs: 1.7 10*3/uL (ref 1.7–7.7)
Neutrophils Relative %: 47 %
Platelets: 86 10*3/uL — ABNORMAL LOW (ref 150–400)
RBC: 3.71 MIL/uL — ABNORMAL LOW (ref 4.22–5.81)
RDW: 14.1 % (ref 11.5–15.5)
WBC: 3.7 10*3/uL — ABNORMAL LOW (ref 4.0–10.5)
nRBC: 0 % (ref 0.0–0.2)

## 2021-03-20 LAB — RETICULOCYTES
Immature Retic Fract: 10.8 % (ref 2.3–15.9)
RBC.: 3.73 MIL/uL — ABNORMAL LOW (ref 4.22–5.81)
Retic Count, Absolute: 38 10*3/uL (ref 19.0–186.0)
Retic Ct Pct: 1 % (ref 0.4–3.1)

## 2021-03-20 LAB — IRON AND TIBC
Iron: 84 ug/dL (ref 45–182)
Saturation Ratios: 28 % (ref 17.9–39.5)
TIBC: 297 ug/dL (ref 250–450)
UIBC: 213 ug/dL

## 2021-03-20 LAB — COMPREHENSIVE METABOLIC PANEL
ALT: 17 U/L (ref 0–44)
AST: 26 U/L (ref 15–41)
Albumin: 3.6 g/dL (ref 3.5–5.0)
Alkaline Phosphatase: 90 U/L (ref 38–126)
Anion gap: 6 (ref 5–15)
BUN: 12 mg/dL (ref 8–23)
CO2: 27 mmol/L (ref 22–32)
Calcium: 8.9 mg/dL (ref 8.9–10.3)
Chloride: 103 mmol/L (ref 98–111)
Creatinine, Ser: 1.26 mg/dL — ABNORMAL HIGH (ref 0.61–1.24)
GFR, Estimated: 60 mL/min — ABNORMAL LOW (ref >60)
Glucose, Bld: 90 mg/dL (ref 70–99)
Potassium: 3.6 mmol/L (ref 3.5–5.1)
Sodium: 136 mmol/L (ref 135–145)
Total Bilirubin: 1.6 mg/dL — ABNORMAL HIGH (ref 0.3–1.2)
Total Protein: 6.5 g/dL (ref 6.5–8.1)

## 2021-03-20 LAB — FERRITIN: Ferritin: 109 ng/mL (ref 24–336)

## 2021-03-20 LAB — LACTATE DEHYDROGENASE: LDH: 114 U/L (ref 98–192)

## 2021-03-26 NOTE — Progress Notes (Signed)
[facility] [address], [place] [address]   CLINIC:  Medical Oncology/Hematology  PCP:  Benita Stabile, MD [address] [name] [address]  [phone]  REASON FOR VISIT:  Follow-up for DVT of right leg and pulmonary embolism  PRIOR THERAPY: none  CURRENT THERAPY: Eliquis 5 mg QD  INTERVAL HISTORY:  [name], a 75 y.o. male, returns for routine follow-up for his DVT of right leg and pulmonary embolism. Ching was last seen on 11/06/2020.  Today he reports feeling okay. He reports soreness of his mouth. He walks aided by a cane and he denies any recent falls. He denies easily bruising anywhere or any ankle swellings. He has not noticed bloody or black stools.   REVIEW OF SYSTEMS:  Review of Systems  Constitutional: Positive for appetite change (50%) and fatigue (25%).  HENT:   Positive for mouth sores (cold sores). Negative for nosebleeds.   Respiratory: Positive for shortness of breath.   Gastrointestinal: Positive for constipation and nausea. Negative for blood in stool.  Neurological: Positive for dizziness (constant) and headaches.  Hematological: Does not bruise/bleed easily.  Psychiatric/Behavioral: Positive for sleep disturbance (occasional).  All other systems reviewed and are negative.   PAST MEDICAL/SURGICAL HISTORY:  Past Medical History:  Diagnosis Date  . Bursitis    l shoulder  . Cataract   . Elevated bilirubin   . H/O: rheumatic fever   . Kidney stones   . Migraines   . Pulmonary embolism (HCC)   . Rheumatic fever   . Tendonitis    L shoulder   Past Surgical History:  Procedure Laterality Date  . ABDOMINAL SURGERY    . CATARACT EXTRACTION, BILATERAL    . CHOLECYSTECTOMY    . CRANIOTOMY Bilateral 09/06/2018   Procedure: BILATERAL CRANIOTOMIES FOR SUBDURAL HEMATOMA EVACUATION;  Surgeon: Maeola Harman, MD;  Location: [facility] OR;  Service: Neurosurgery;  Laterality: Bilateral;  . EYE SURGERY      SOCIAL  HISTORY:  Social History   Socioeconomic History  . Marital status: Widowed    Spouse name: Not on file  . Number of children: Not on file  . Years of education: Not on file  . Highest education level: Not on file  Occupational History  . Occupation: retired  Tobacco Use  . Smoking status: Current Every Day Smoker    Packs/day: 1.00    Years: 50.00    Pack years: 50.00    Types: Cigarettes  . Smokeless tobacco: Never Used  Vaping Use  . Vaping Use: Never used  Substance and Sexual Activity  . Alcohol use: Yes  . Drug use: Never  . Sexual activity: Not Currently  Other Topics Concern  . Not on file  Social History Narrative  . Not on file   Social Determinants of Health   Financial Resource Strain: Not on file  Food Insecurity: Not on file  Transportation Needs: Not on file  Physical Activity: Not on file  Stress: Not on file  Social Connections: Not on file  Intimate Partner Violence: Not on file    FAMILY HISTORY:  Family History  Problem Relation Age of Onset  . Atrial fibrillation Mother   . Macular degeneration Mother   . Atrial fibrillation Father   . Macular degeneration Father   . Diabetes Brother   . Seizures Brother   . Pancreatic cancer Brother   . Obesity Brother     CURRENT MEDICATIONS:  Current Outpatient  Medications  Medication Sig Dispense Refill  . AIMOVIG 70 MG/ML SOAJ Inject 1 Syringe into the skin every 30 (thirty) days.    Marland Kitchen apixaban (ELIQUIS) 5 MG TABS tablet Take 2 tablets by mouth twice a day for 7 days; then start taking 1 tablet by mouth twice a day on daily basis. 60 tablet 3  . cyclobenzaprine (FLEXERIL) 10 MG tablet Take 10 mg by mouth every 8 (eight) hours as needed.  (Patient not taking: Reported on 11/20/2020)    . HYDROcodone-acetaminophen (NORCO/VICODIN) 5-325 MG tablet Take 1 tablet by mouth every 6 (six) hours as needed.  (Patient not taking: Reported on 11/20/2020)    . ondansetron (ZOFRAN) 4 MG tablet Take 4 mg by mouth  every 6 (six) hours as needed for nausea or vomiting.  (Patient not taking: Reported on 11/20/2020)    . Polyethyl Glycol-Propyl Glycol 0.4-0.3 % SOLN Apply 1 drop to eye as needed. (Patient not taking: Reported on 11/20/2020)    . tadalafil (CIALIS) 5 MG tablet Take 5 mg by mouth at bedtime.     No current facility-administered medications for this visit.    ALLERGIES:  Allergies  Allergen Reactions  . Flucloxacillin Anaphylaxis    PHYSICAL EXAM:  Performance status (ECOG): 1 - Symptomatic but completely ambulatory  There were no vitals filed for this visit. Wt Readings from Last 3 Encounters:  11/06/20 142 lb 6.4 oz (64.6 kg)  10/12/20 145 lb (65.8 kg)  09/30/20 146 lb (66.2 kg)   Physical Exam Vitals reviewed.  Constitutional:      Appearance: Normal appearance.  HENT:     Mouth/Throat:     Comments: Redness and swelling of gums Cardiovascular:     Rate and Rhythm: Normal rate and regular rhythm.     Pulses: Normal pulses.     Heart sounds: Normal heart sounds.  Pulmonary:     Effort: Pulmonary effort is normal.     Breath sounds: Normal breath sounds.  Neurological:     General: No focal deficit present.     Mental Status: He is alert and oriented to person, place, and time.  Psychiatric:        Mood and Affect: Mood normal.        Behavior: Behavior normal.     LABORATORY DATA:  I have reviewed the labs as listed.  CBC Latest Ref Rng & Units 03/20/2021 11/06/2020 10/01/2020  WBC 4.0 - 10.5 K/uL 3.7(L) 4.8 4.0  Hemoglobin 13.0 - 17.0 g/dL 96.7 89.3 12.5(L)  Hematocrit 39.0 - 52.0 % 40.4 39.4 37.9(L)  Platelets 150 - 400 K/uL 86(L) 83(L) 59(L)   CMP Latest Ref Rng & Units 03/20/2021 11/06/2020 10/01/2020  Glucose 70 - 99 mg/dL 90 - 810(F)  BUN 8 - 23 mg/dL 12 - 13  Creatinine 7.51 - 1.24 mg/dL 0.25(E) - 5.27  Sodium 135 - 145 mmol/L 136 - 136  Potassium 3.5 - 5.1 mmol/L 3.6 - 4.1  Chloride 98 - 111 mmol/L 103 - 105  CO2 22 - 32 mmol/L 27 - 26  Calcium 8.9 -  10.3 mg/dL 8.9 - 8.3(L)  Total Protein 6.5 - 8.1 g/dL 6.5 6.8 -  Total Bilirubin 0.3 - 1.2 mg/dL 7.8(E) 2.2(H) -  Alkaline Phos 38 - 126 U/L 90 91 -  AST 15 - 41 U/L 26 27 -  ALT 0 - 44 U/L 17 17 -      Component Value Date/Time   RBC 3.73 (L) 03/20/2021 1412   RBC 3.71 (  L) 03/20/2021 1411   MCV 108.9 (H) 03/20/2021 1411   MCH 36.9 (H) 03/20/2021 1411   MCHC 33.9 03/20/2021 1411   RDW 14.1 03/20/2021 1411   LYMPHSABS 1.4 03/20/2021 1411   MONOABS 0.4 03/20/2021 1411   EOSABS 0.1 03/20/2021 1411   BASOSABS 0.0 03/20/2021 1411    DIAGNOSTIC IMAGING:  I have independently reviewed the scans and discussed with the patient. No results found.   ASSESSMENT:  1. Moderate thrombocytopenia: -Moderate thrombocytopenia with platelet count between 81-1 15 since November 2019. -CTAP on 09/19/2019 showed normal spleen and liver. -Nutritional deficiency work-up, SPEP and hepatitis panel negative. -Differential diagnosis includes immune mediated thrombocytopenia versus MDS.  2. Unprovoked bilateral pulmonary embolism: -Hospital admission from 10/15/2019 through 10/17/2019 with hemoptysis. -CT chest on 10/16/2019 showed acute bilateral pulmonary emboli greater within the left lower lobe with no evidence of right heart strain. Dopplers showed no evidence of DVT. -Eliquis was apparently discontinued in October 2021. -CT angio on 09/30/2020 shows acute PE in segmental and subsegmental branches supplying the right lower, right middle and left lower and left upper lobes. -He was started back on Eliquis.  3.Macrocytosis: -High MCV with normal hemoglobin. B12 and folic acid was normal. -Differential includes early myelodysplasia.   PLAN:  1. Moderate thrombocytopenia: -Reviewed labs from 03/20/2021. - Platelet count is stable around 86,000.  No bleeding issues reported. - He wants to have dental extractions done.  He is cleared to have them done. - RTC 4 months with labs.  2.  Unprovoked bilateral pulmonary embolism: -Continue Eliquis.  No bleeding issues reported.  3. Macrocytic anemia: -CBC on 03/20/2021 shows hemoglobin 13.7 with MCV 108. - Ferritin is 109 and percent saturation is 28.  He does have CKD with latest creatinine 1.26.   Orders placed this encounter:  No orders of the defined types were placed in this encounter.    Doreatha Massed, MD [facility] [phone]   I, Alda Ponder, am acting as a scribe for Dr. Doreatha Massed.  I, Doreatha Massed MD, have reviewed the above documentation for accuracy and completeness, and I agree with the above.

## 2021-03-27 ENCOUNTER — Inpatient Hospital Stay (HOSPITAL_BASED_OUTPATIENT_CLINIC_OR_DEPARTMENT_OTHER): Payer: Medicare Other | Admitting: Hematology

## 2021-03-27 ENCOUNTER — Other Ambulatory Visit: Payer: Self-pay

## 2021-03-27 VITALS — BP 129/76 | HR 100 | Temp 96.8°F | Resp 16 | Wt 137.5 lb

## 2021-03-27 DIAGNOSIS — D696 Thrombocytopenia, unspecified: Secondary | ICD-10-CM | POA: Diagnosis not present

## 2021-03-27 DIAGNOSIS — I2694 Multiple subsegmental pulmonary emboli without acute cor pulmonale: Secondary | ICD-10-CM

## 2021-03-27 DIAGNOSIS — I82431 Acute embolism and thrombosis of right popliteal vein: Secondary | ICD-10-CM

## 2021-03-27 NOTE — Patient Instructions (Signed)
[facility] at [facility] °Discharge Instructions ° °You were seen today by Dr. Katragadda. He went over your recent results. Dr. Katragadda will see you back in 4 months for labs and follow up. ° ° °Thank you for choosing [facility] at [facility] to provide your oncology and hematology care.  To afford each patient quality time with our provider, please arrive at least 15 minutes before your scheduled appointment time.  ° °If you have a lab appointment with the Cancer Center please come in thru the Main Entrance and check in at the main information desk ° °You need to re-schedule your appointment should you arrive 10 or more minutes late.  We strive to give you quality time with our providers, and arriving late affects you and other patients whose appointments are after yours.  Also, if you no show three or more times for appointments you may be dismissed from the clinic at the providers discretion.     °Again, thank you for choosing [facility].  Our hope is that these requests will decrease the amount of time that you wait before being seen by our physicians.       °_____________________________________________________________ ° °Should you have questions after your visit to [facility], please contact our office at [phone] between the hours of 8:00 a.m. and 4:30 p.m.  Voicemails left after 4:00 p.m. will not be returned until the following business day.  For prescription refill requests, have your pharmacy contact our office and allow 72 hours.   ° °Cancer Center Support Programs:  ° °> Cancer Support Group  °2nd Tuesday of the month 1pm-2pm, Journey Room  ° ° °

## 2021-07-25 ENCOUNTER — Inpatient Hospital Stay (HOSPITAL_COMMUNITY): Payer: Medicare Other | Attending: Hematology

## 2021-07-25 ENCOUNTER — Other Ambulatory Visit: Payer: Self-pay

## 2021-07-25 DIAGNOSIS — D649 Anemia, unspecified: Secondary | ICD-10-CM | POA: Insufficient documentation

## 2021-07-25 DIAGNOSIS — R630 Anorexia: Secondary | ICD-10-CM | POA: Diagnosis not present

## 2021-07-25 DIAGNOSIS — Z8349 Family history of other endocrine, nutritional and metabolic diseases: Secondary | ICD-10-CM | POA: Diagnosis not present

## 2021-07-25 DIAGNOSIS — F17210 Nicotine dependence, cigarettes, uncomplicated: Secondary | ICD-10-CM | POA: Insufficient documentation

## 2021-07-25 DIAGNOSIS — R5383 Other fatigue: Secondary | ICD-10-CM | POA: Insufficient documentation

## 2021-07-25 DIAGNOSIS — Z8249 Family history of ischemic heart disease and other diseases of the circulatory system: Secondary | ICD-10-CM | POA: Insufficient documentation

## 2021-07-25 DIAGNOSIS — Z86718 Personal history of other venous thrombosis and embolism: Secondary | ICD-10-CM | POA: Insufficient documentation

## 2021-07-25 DIAGNOSIS — R110 Nausea: Secondary | ICD-10-CM | POA: Insufficient documentation

## 2021-07-25 DIAGNOSIS — Z86711 Personal history of pulmonary embolism: Secondary | ICD-10-CM | POA: Diagnosis not present

## 2021-07-25 DIAGNOSIS — Z7901 Long term (current) use of anticoagulants: Secondary | ICD-10-CM | POA: Insufficient documentation

## 2021-07-25 DIAGNOSIS — G479 Sleep disorder, unspecified: Secondary | ICD-10-CM | POA: Diagnosis not present

## 2021-07-25 DIAGNOSIS — R519 Headache, unspecified: Secondary | ICD-10-CM | POA: Insufficient documentation

## 2021-07-25 DIAGNOSIS — K5900 Constipation, unspecified: Secondary | ICD-10-CM | POA: Diagnosis not present

## 2021-07-25 DIAGNOSIS — Z83518 Family history of other specified eye disorder: Secondary | ICD-10-CM | POA: Insufficient documentation

## 2021-07-25 DIAGNOSIS — D696 Thrombocytopenia, unspecified: Secondary | ICD-10-CM | POA: Insufficient documentation

## 2021-07-25 DIAGNOSIS — R42 Dizziness and giddiness: Secondary | ICD-10-CM | POA: Diagnosis not present

## 2021-07-25 DIAGNOSIS — R0602 Shortness of breath: Secondary | ICD-10-CM | POA: Insufficient documentation

## 2021-07-25 DIAGNOSIS — R600 Localized edema: Secondary | ICD-10-CM | POA: Diagnosis not present

## 2021-07-25 DIAGNOSIS — Z833 Family history of diabetes mellitus: Secondary | ICD-10-CM | POA: Diagnosis not present

## 2021-07-25 DIAGNOSIS — Z9049 Acquired absence of other specified parts of digestive tract: Secondary | ICD-10-CM | POA: Insufficient documentation

## 2021-07-25 DIAGNOSIS — Z820 Family history of epilepsy and other diseases of the nervous system: Secondary | ICD-10-CM | POA: Insufficient documentation

## 2021-07-25 DIAGNOSIS — I2699 Other pulmonary embolism without acute cor pulmonale: Secondary | ICD-10-CM | POA: Diagnosis not present

## 2021-07-25 DIAGNOSIS — R040 Epistaxis: Secondary | ICD-10-CM | POA: Diagnosis not present

## 2021-07-25 DIAGNOSIS — R634 Abnormal weight loss: Secondary | ICD-10-CM | POA: Diagnosis not present

## 2021-07-25 DIAGNOSIS — I82431 Acute embolism and thrombosis of right popliteal vein: Secondary | ICD-10-CM

## 2021-07-25 DIAGNOSIS — Z880 Allergy status to penicillin: Secondary | ICD-10-CM | POA: Diagnosis not present

## 2021-07-25 DIAGNOSIS — Z800 Family history of malignant neoplasm of digestive organs: Secondary | ICD-10-CM | POA: Insufficient documentation

## 2021-07-25 DIAGNOSIS — I2694 Multiple subsegmental pulmonary emboli without acute cor pulmonale: Secondary | ICD-10-CM

## 2021-07-25 LAB — CBC WITH DIFFERENTIAL/PLATELET
Abs Immature Granulocytes: 0.01 10*3/uL (ref 0.00–0.07)
Basophils Absolute: 0 10*3/uL (ref 0.0–0.1)
Basophils Relative: 1 %
Eosinophils Absolute: 0 10*3/uL (ref 0.0–0.5)
Eosinophils Relative: 1 %
HCT: 37.4 % — ABNORMAL LOW (ref 39.0–52.0)
Hemoglobin: 12.6 g/dL — ABNORMAL LOW (ref 13.0–17.0)
Immature Granulocytes: 0 %
Lymphocytes Relative: 23 %
Lymphs Abs: 1 10*3/uL (ref 0.7–4.0)
MCH: 37.1 pg — ABNORMAL HIGH (ref 26.0–34.0)
MCHC: 33.7 g/dL (ref 30.0–36.0)
MCV: 110 fL — ABNORMAL HIGH (ref 80.0–100.0)
Monocytes Absolute: 0.4 10*3/uL (ref 0.1–1.0)
Monocytes Relative: 10 %
Neutro Abs: 2.7 10*3/uL (ref 1.7–7.7)
Neutrophils Relative %: 65 %
Platelets: 77 10*3/uL — ABNORMAL LOW (ref 150–400)
RBC: 3.4 MIL/uL — ABNORMAL LOW (ref 4.22–5.81)
RDW: 14.2 % (ref 11.5–15.5)
WBC: 4.2 10*3/uL (ref 4.0–10.5)
nRBC: 0 % (ref 0.0–0.2)

## 2021-07-25 LAB — RETICULOCYTES
Immature Retic Fract: 7.2 % (ref 2.3–15.9)
RBC.: 3.49 MIL/uL — ABNORMAL LOW (ref 4.22–5.81)
Retic Count, Absolute: 35.6 10*3/uL (ref 19.0–186.0)
Retic Ct Pct: 1 % (ref 0.4–3.1)

## 2021-07-25 LAB — IRON AND TIBC
Iron: 124 ug/dL (ref 45–182)
Saturation Ratios: 47 % — ABNORMAL HIGH (ref 17.9–39.5)
TIBC: 263 ug/dL (ref 250–450)
UIBC: 139 ug/dL

## 2021-07-25 LAB — LACTATE DEHYDROGENASE: LDH: 134 U/L (ref 98–192)

## 2021-07-25 LAB — FERRITIN: Ferritin: 113 ng/mL (ref 24–336)

## 2021-07-26 LAB — COPPER, SERUM: Copper: 101 ug/dL (ref 69–132)

## 2021-07-28 IMAGING — US US EXTREM LOW VENOUS
1 series · 13 of 24 positions shown · non-contrast
Comparison: None.

CLINICAL DATA: Pulmonary embolus.



[Series 1: us extrem low venous · 0.08mm/px · 13 of 62 slices shown]
[im 1/62]
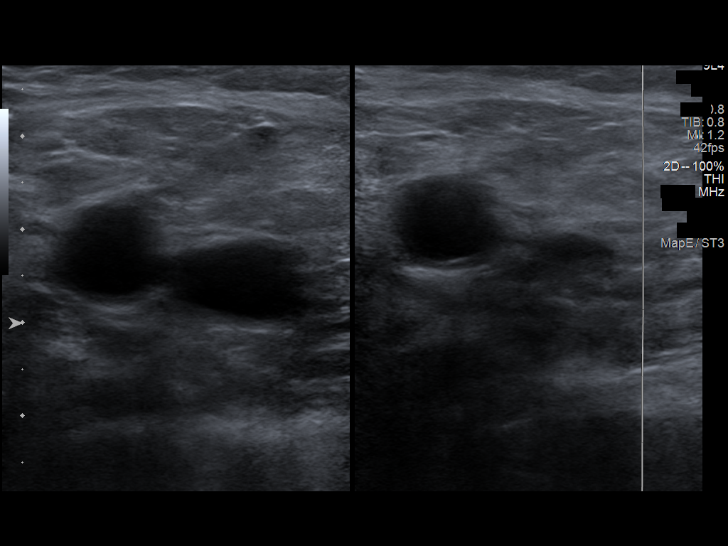
[im 6/62]
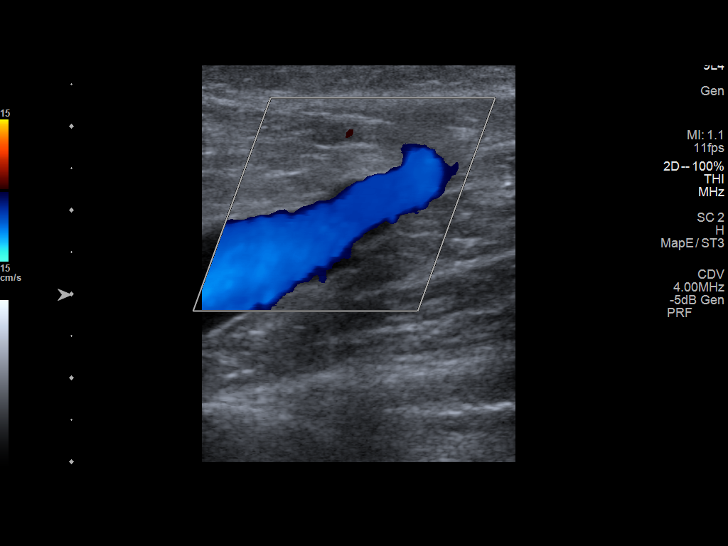
[im 11/62]
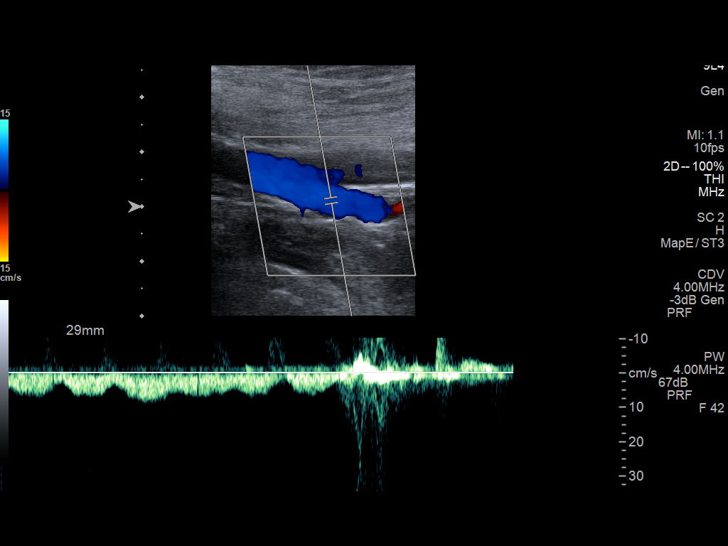
[im 16/62]
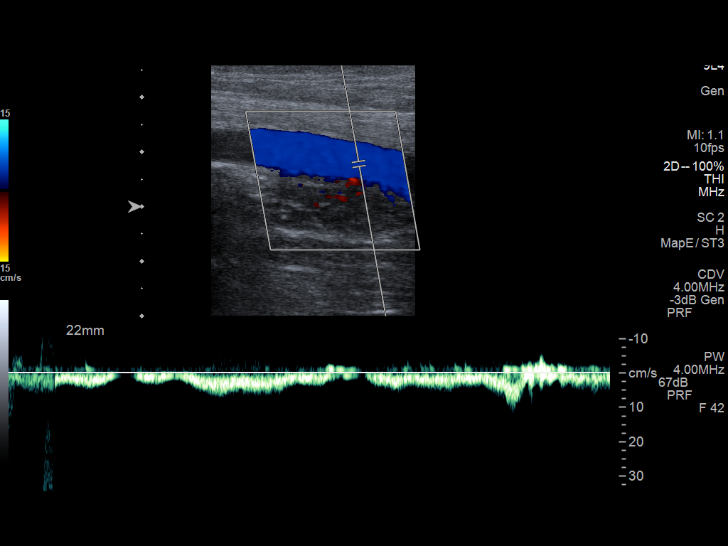
[im 22/62]
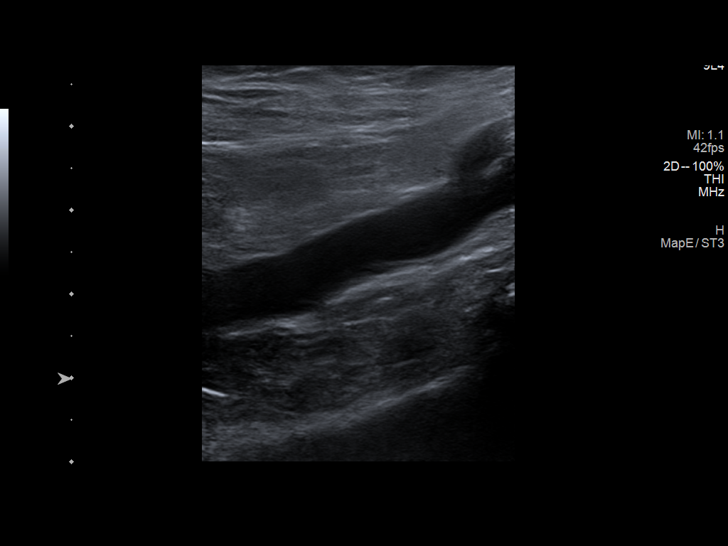
[im 27/62]
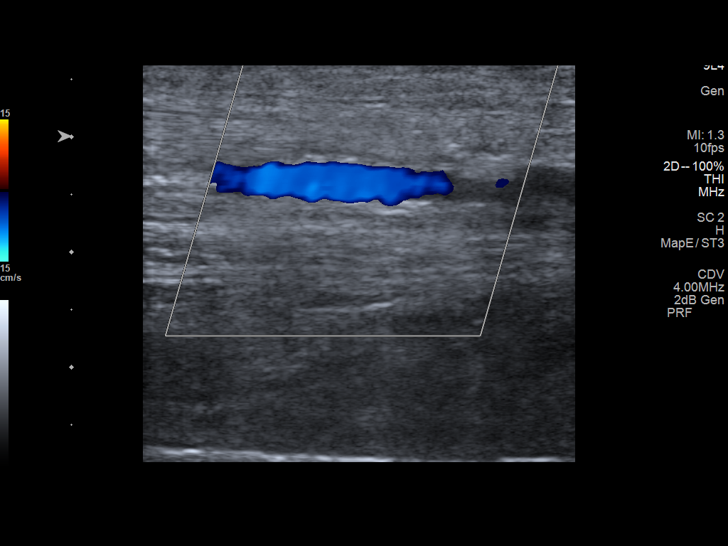
[im 32/62]
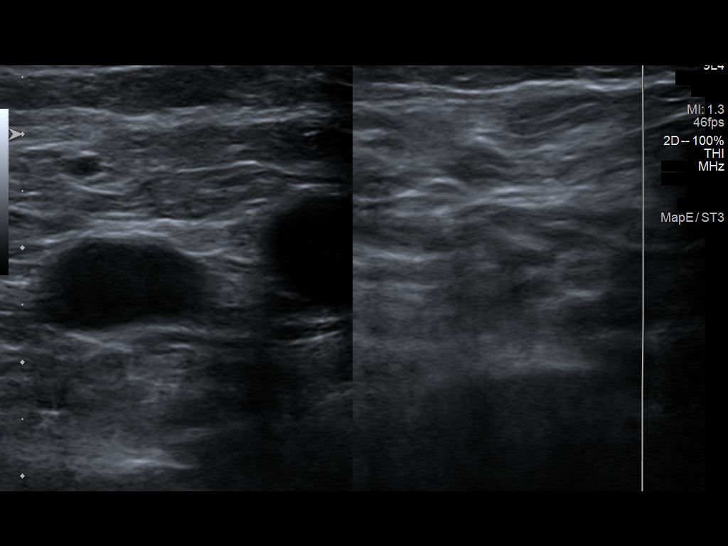
[im 35/62]
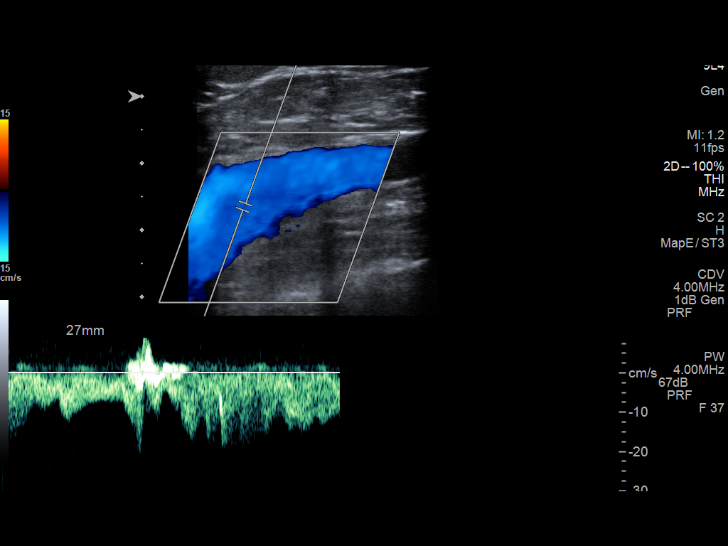
[im 40/62]
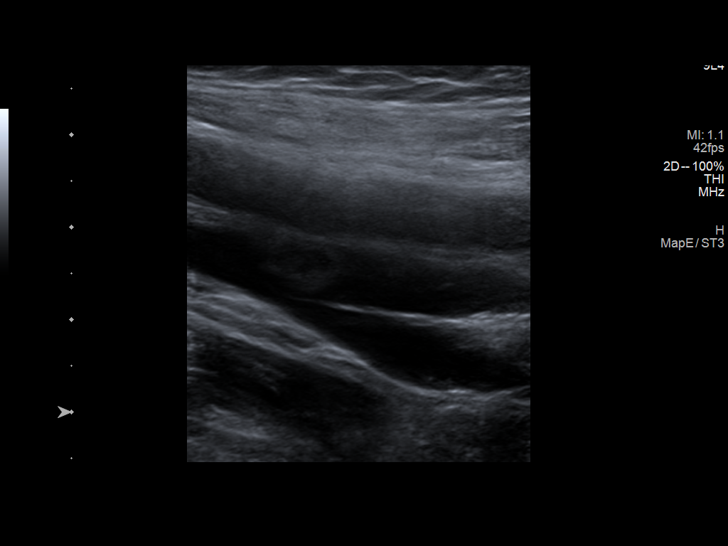
[im 46/62]
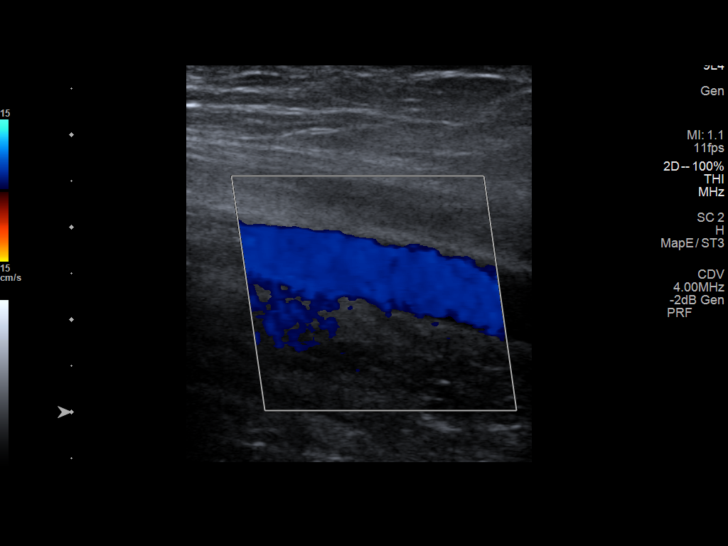
[im 51/62]
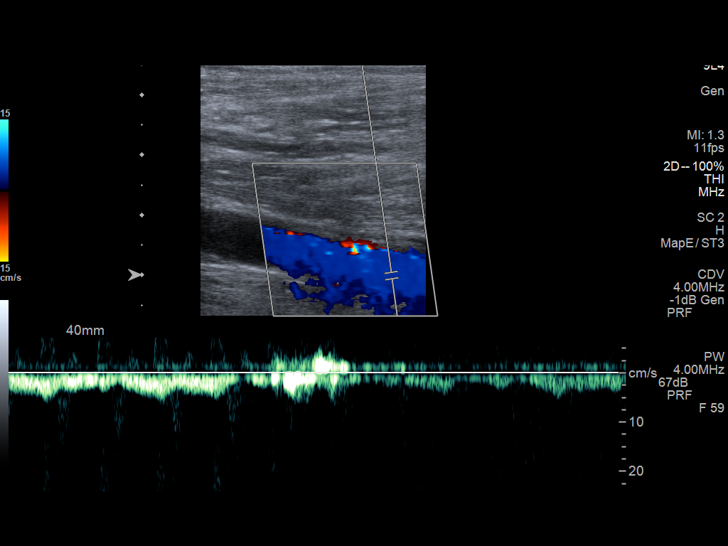
[im 56/62]
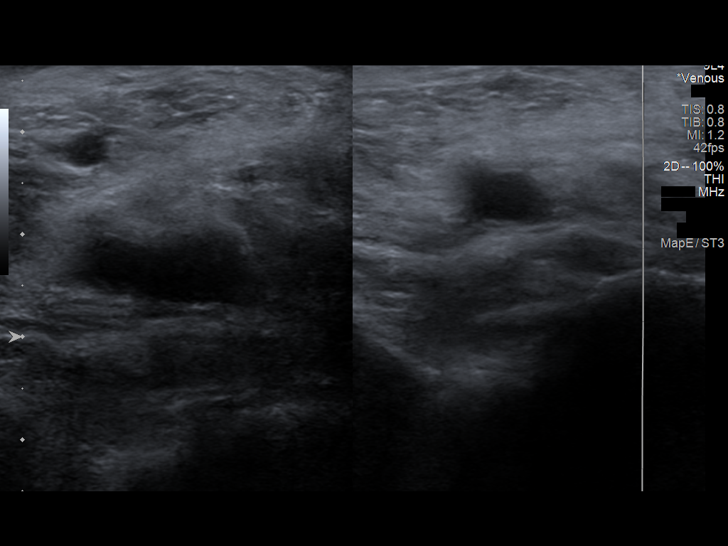
[im 62/62]
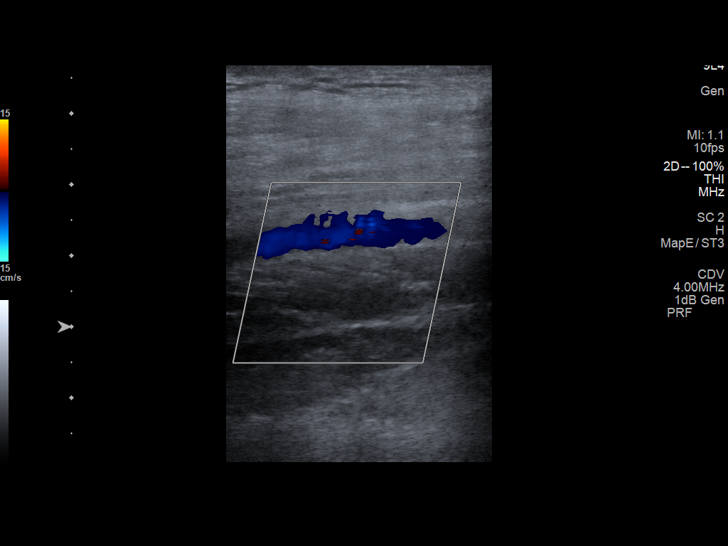

[13 of 24 positions shown; findings below may reference images not displayed]

FINDINGS: RIGHT LOWER EXTREMITY

Common Femoral Vein: No evidence of thrombus. Normal
compressibility, respiratory phasicity and response to augmentation.

Saphenofemoral Junction: No evidence of thrombus. Normal
compressibility and flow on color Doppler imaging.

Profunda Femoral Vein: No evidence of thrombus. Normal
compressibility and flow on color Doppler imaging.

Femoral Vein: No evidence of thrombus. Normal compressibility,
respiratory phasicity and response to augmentation.

Popliteal Vein: No evidence of thrombus. Normal compressibility,
respiratory phasicity and response to augmentation.

Calf Veins: No evidence of thrombus. Normal compressibility and flow
on color Doppler imaging.

Superficial Great Saphenous Vein: No evidence of thrombus. Normal
compressibility.

Venous Reflux:  None.

Other Findings:  None.

LEFT LOWER EXTREMITY

Common Femoral Vein: No evidence of thrombus. Normal
compressibility, respiratory phasicity and response to augmentation.

Saphenofemoral Junction: No evidence of thrombus. Normal
compressibility and flow on color Doppler imaging.

Profunda Femoral Vein: No evidence of thrombus. Normal
compressibility and flow on color Doppler imaging.

Femoral Vein: No evidence of thrombus. Normal compressibility,
respiratory phasicity and response to augmentation.

Popliteal Vein: No evidence of thrombus. Normal compressibility,
respiratory phasicity and response to augmentation.

Calf Veins: No evidence of thrombus. Normal compressibility and flow
on color Doppler imaging.

Superficial Great Saphenous Vein: No evidence of thrombus. Normal
compressibility.

Venous Reflux:  None.

Other Findings:  None.
IMPRESSION: No evidence of deep venous thrombosis in either lower extremity.

## 2021-07-31 NOTE — Progress Notes (Signed)
[place], [place] [address]   CLINIC:  Medical Oncology/Hematology  PCP:  Celene Squibb, MD [address] [name] [address] [phone]   REASON FOR VISIT:  Follow-up for history of bilateral pulmonary embolism and moderate thrombocytopenia  CURRENT THERAPY: Eliquis 5 mg twice daily  INTERVAL HISTORY:  [name] 75 y.o. male returns for routine follow-up of his thrombocytopenia and his history of unprovoked pulmonary embolisms.  At today's visit, he reports feeling about the same.  No recent hospitalizations, surgeries, or changes in baseline health status.  Regarding his thrombocytopenia, he admits to easy bruising.  He denies petechial rash.  He has occasional scant epistaxis, notes some streaks of blood when he blows his nose first thing in the morning.  He denies any major bleeding episodes, no hematemesis, hematochezia, melena, or hematuria.    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  He has chronic dyspnea on exertion, but denies any changes in breathing patterns.  No shortness of breath at rest, chest pain, cough, hemoptysis, or palpitations.  He denies fever/chills and night sweats.  However, he does note that he has had decreased appetite, and has lost approximately 15 pounds since December 2021.  He has 40% energy and 60% appetite.    REVIEW OF SYSTEMS:  Review of Systems  Constitutional:  Positive for appetite change, fatigue and unexpected weight change. Negative for chills, diaphoresis and fever.  HENT:   Negative for lump/mass and nosebleeds.   Eyes:  Negative for eye problems.  Respiratory:  Positive for shortness of breath (with  exertion, chronic). Negative for cough and hemoptysis.   Cardiovascular:  Positive for leg swelling (right > left, chronic). Negative for chest pain and palpitations.  Gastrointestinal:  Positive for constipation and nausea. Negative for abdominal pain, blood in stool, diarrhea and vomiting.  Genitourinary:  Negative for hematuria.        Urinary urgency  Skin: Negative.   Neurological:  Positive for dizziness and headaches. Negative for light-headedness.  Hematological:  Does not bruise/bleed easily.  Psychiatric/Behavioral:  Positive for sleep disturbance.      PAST MEDICAL/SURGICAL HISTORY:  Past Medical History:  Diagnosis Date   Bursitis    l shoulder   Cataract    Elevated bilirubin    H/O: rheumatic fever    Kidney stones    Migraines    Pulmonary embolism (HCC)    Rheumatic fever    Tendonitis    L shoulder   Past Surgical History:  Procedure Laterality Date   ABDOMINAL SURGERY     CATARACT EXTRACTION, BILATERAL     CHOLECYSTECTOMY     CRANIOTOMY Bilateral 09/06/2018   Procedure: BILATERAL CRANIOTOMIES FOR SUBDURAL HEMATOMA EVACUATION;  Surgeon: Erline Levine, MD;  Location: [facility];  Service: Neurosurgery;  Laterality: Bilateral;   EYE SURGERY       SOCIAL HISTORY:  Social History   Socioeconomic History   Marital status: Widowed    Spouse name: Not on file   Number of children: Not on file   Years of education: Not on file   Highest education level: Not on file  Occupational History   Occupation: retired  Tobacco Use  Smoking status: Every Day    Packs/day: 1.00    Years: 50.00    Pack years: 50.00    Types: Cigarettes   Smokeless tobacco: Never  Vaping Use   Vaping Use: Never used  Substance and Sexual Activity   Alcohol use: Yes   Drug use: Never   Sexual activity: Not Currently  Other Topics Concern   Not on file  Social History Narrative   Not on file   Social Determinants of Health   Financial Resource Strain: Not on file  Food  Insecurity: Not on file  Transportation Needs: Not on file  Physical Activity: Not on file  Stress: Not on file  Social Connections: Not on file  Intimate Partner Violence: Not on file    FAMILY HISTORY:  Family History  Problem Relation Age of Onset   Atrial fibrillation Mother    Macular degeneration Mother    Atrial fibrillation Father    Macular degeneration Father    Diabetes Brother    Seizures Brother    Pancreatic cancer Brother    Obesity Brother     CURRENT MEDICATIONS:  Outpatient Encounter Medications as of 08/01/2021  Medication Sig   AIMOVIG 70 MG/ML SOAJ Inject 1 Syringe into the skin every 30 (thirty) days.   apixaban (ELIQUIS) 5 MG TABS tablet Take 2 tablets by mouth twice a day for 7 days; then start taking 1 tablet by mouth twice a day on daily basis.   cyclobenzaprine (FLEXERIL) 10 MG tablet Take 10 mg by mouth every 8 (eight) hours as needed.  (Patient not taking: Reported on 03/27/2021)   HYDROcodone-acetaminophen (NORCO/VICODIN) 5-325 MG tablet Take 1 tablet by mouth every 6 (six) hours as needed.  (Patient not taking: Reported on 03/27/2021)   ondansetron (ZOFRAN) 4 MG tablet Take 4 mg by mouth every 6 (six) hours as needed for nausea or vomiting.  (Patient not taking: Reported on 03/27/2021)   Polyethyl Glycol-Propyl Glycol 0.4-0.3 % SOLN Apply 1 drop to eye as needed. (Patient not taking: Reported on 03/27/2021)   tadalafil (CIALIS) 5 MG tablet Take 5 mg by mouth at bedtime.   No facility-administered encounter medications on file as of 08/01/2021.    ALLERGIES:  Allergies  Allergen Reactions   Flucloxacillin Anaphylaxis     PHYSICAL EXAM:  ECOG PERFORMANCE STATUS: 1 - Symptomatic but completely ambulatory  There were no vitals filed for this visit. There were no vitals filed for this visit. Physical Exam Constitutional:      Appearance: Normal appearance.     Comments: Patient appears slightly cachectic with temporal muscle wasting and folds of  loose skin indicating recent weight loss.  HENT:     Head: Normocephalic and atraumatic.     Mouth/Throat:     Mouth: Mucous membranes are moist.  Eyes:     Extraocular Movements: Extraocular movements intact.     Pupils: Pupils are equal, round, and reactive to light.  Cardiovascular:     Rate and Rhythm: Normal rate and regular rhythm.     Pulses: Normal pulses.     Heart sounds: Normal heart sounds.  Pulmonary:     Effort: Pulmonary effort is normal.     Breath sounds: Normal breath sounds.  Abdominal:     General: Bowel sounds are normal.     Palpations: Abdomen is soft.     Tenderness: There is no abdominal tenderness.  Musculoskeletal:        General: No swelling.     Right  lower leg: No edema.     Left lower leg: No edema.  Lymphadenopathy:     Cervical: No cervical adenopathy.  Skin:    General: Skin is warm and dry.  Neurological:     General: No focal deficit present.     Mental Status: He is alert and oriented to person, place, and time.  Psychiatric:        Mood and Affect: Mood normal.        Behavior: Behavior normal.     LABORATORY DATA:  I have reviewed the labs as listed.  CBC    Component Value Date/Time   WBC 4.2 07/25/2021 1406   RBC 3.40 (L) 07/25/2021 1406   RBC 3.49 (L) 07/25/2021 1406   HGB 12.6 (L) 07/25/2021 1406   HCT 37.4 (L) 07/25/2021 1406   PLT 77 (L) 07/25/2021 1406   MCV 110.0 (H) 07/25/2021 1406   MCH 37.1 (H) 07/25/2021 1406   MCHC 33.7 07/25/2021 1406   RDW 14.2 07/25/2021 1406   LYMPHSABS 1.0 07/25/2021 1406   MONOABS 0.4 07/25/2021 1406   EOSABS 0.0 07/25/2021 1406   BASOSABS 0.0 07/25/2021 1406   CMP Latest Ref Rng & Units 03/20/2021 11/06/2020 10/01/2020  Glucose 70 - 99 mg/dL 90 - 116(H)  BUN 8 - 23 mg/dL 12 - 13  Creatinine 0.61 - 1.24 mg/dL 1.26(H) - 0.99  Sodium 135 - 145 mmol/L 136 - 136  Potassium 3.5 - 5.1 mmol/L 3.6 - 4.1  Chloride 98 - 111 mmol/L 103 - 105  CO2 22 - 32 mmol/L 27 - 26  Calcium 8.9 - 10.3  mg/dL 8.9 - 8.3(L)  Total Protein 6.5 - 8.1 g/dL 6.5 6.8 -  Total Bilirubin 0.3 - 1.2 mg/dL 1.6(H) 2.2(H) -  Alkaline Phos 38 - 126 U/L 90 91 -  AST 15 - 41 U/L 26 27 -  ALT 0 - 44 U/L 17 17 -    DIAGNOSTIC IMAGING:  I have independently reviewed the relevant imaging and discussed with the patient.  ASSESSMENT & PLAN: 1.  Moderate thrombocytopenia - Moderate thrombocytopenia with platelet count between 59-115 since November 2019. - CTAP on 09/19/2019 showed normal spleen and liver. - Nutritional deficiency work-up, SPEP and hepatitis panel negative. - Differential diagnosis includes immune mediated thrombocytopenia versus MDS. - Admits to easy bruising, but denies any major bleeding episodes or petechial rash - Most recent labs (07/25/2021): Platelets 77, remain low but at baseline - PLAN: No indication for treatment at this time.  Recommend continuing Eliquis (see below), but if platelets drop below 50, we will reassess risk/benefits.  Repeat labs and RTC in 3 months.  If any significant derangements in CBC, would consider bone marrow biopsy.  2.  Bilateral pulmonary embolisms, recurrent, unprovoked - Hospital admission from 10/15/2019 through 10/17/2019 with hemoptysis. - CT chest on 10/16/2019 showed acute bilateral pulmonary emboli greater within the left lower lobe with no evidence of right heart strain.  Dopplers showed no evidence of DVT. - Eliquis was apparently discontinued in October 2021. - CT angio on 09/30/2020 shows acute PE in segmental and subsegmental branches supplying the right lower, right middle and left lower and left upper lobes. - He was found to have right leg DVT in November 2021 - " extensive right lower extremity DVT extending throughout femoral vein and deep femoral vein, through popliteal vein and into the calf" - Patient was started back on Eliquis in November 2021 after diagnosis of recurrent PE/DVT - He remains on Eliquis  5 mg twice daily, which he is  tolerating well  - Patient does have some post thrombotic changes of right lower extremity (intermittent right lower extremity edema and right ankle varicose veins), but does not have any acute signs or symptoms concerning for recurrent DVT or PE at this time  - PLAN: Continue indefinite Eliquis.  No bleeding issues reported.  3.  Macrocytic anemia - High MCV intermittent anemia.  B56 and folic acid were normal. - No known history of liver disease, CTAP on 09/19/2019 showed normal spleen and liver. - Differential diagnosis includes early myelodysplasia - Most recent labs (07/25/2021): Hgb mildly low at 12.6 with MCV 110.0.  Normal copper, normal ferritin, mildly elevated iron saturation 47% - PLAN: No indication for treatment at this time, we will continue to monitor with repeat labs (CBC, B12, folate) and RTC in 3 months.  If any significant derangements, would consider bone marrow biopsy.  (Discussed possible bone marrow biopsy with patient, but he is hesitant - reports that he had a bone marrow biopsy in the 1970s, but he does not recall the reason or the results.  Apparently he had some back pain after this and is hesitant to get another biopsy.)  4.  Weight loss - Patient has lost 15 pounds since December 2021 - Reports decreased appetite - PLAN: Discussed with patient some tips to increase caloric intake, including trying adult beverages such as Ensure and Boost.  Referral to dietitian was offered, but patient declined at this time.  If he continues to have significant weight loss in the future, would consider abdominal imaging to rule out occult malignancy.  5.  Tobacco abuse - This patient meets criteria for low-dose CT lung cancer screening (age 74-80 with a 20+ pack year history, current everyday smoker /OR/ quit < 15 years ago, no current signs or symptoms of lung cancer) - He has been smoking 1 PPD x50 years, continues to smoke 1 pack/day cigarettes - The shared decision making  discussion during today's visit included risks and benefits of screening, potential for follow-up, diagnostic testing for abnormal scans, potential for false positive tests, overdiagnosis, discussion about total radiation exposure - Patient stated willingness to undergo diagnostics and treatment as needed - Patient was counseled on smoking cessation to decrease the  risk of lung cancer, pulmonary disease, heart disease, and stroke - Patient has been referred to Lung Cancer Screening Nurse Coordinator for further scheduling of LDCT and for further resources regarding free nicotine replacement therapy and information about smoking cessation classes  - PLAN: We will schedule patient for LDCT scan of chest.   PLAN SUMMARY & DISPOSITION: -LDCT chest - Labs in 3 months - Office visit 1 week after labs  All questions were answered. The patient knows to call the clinic with any problems, questions or concerns.  Medical decision making: Moderate  Time spent on visit: I spent 25 minutes counseling the patient face to face. The total time spent in the appointment was 40 minutes and more than 50% was on counseling.   Harriett Rush, PA-C  08/01/2021 3:12 PM

## 2021-08-01 ENCOUNTER — Other Ambulatory Visit: Payer: Self-pay

## 2021-08-01 ENCOUNTER — Inpatient Hospital Stay (HOSPITAL_BASED_OUTPATIENT_CLINIC_OR_DEPARTMENT_OTHER): Payer: Medicare Other | Admitting: Physician Assistant

## 2021-08-01 VITALS — BP 131/82 | HR 92 | Temp 97.1°F | Resp 19 | Wt 131.4 lb

## 2021-08-01 DIAGNOSIS — Z7901 Long term (current) use of anticoagulants: Secondary | ICD-10-CM

## 2021-08-01 DIAGNOSIS — D696 Thrombocytopenia, unspecified: Secondary | ICD-10-CM

## 2021-08-01 DIAGNOSIS — Z86718 Personal history of other venous thrombosis and embolism: Secondary | ICD-10-CM | POA: Diagnosis not present

## 2021-08-01 DIAGNOSIS — Z87891 Personal history of nicotine dependence: Secondary | ICD-10-CM

## 2021-08-01 DIAGNOSIS — I2699 Other pulmonary embolism without acute cor pulmonale: Secondary | ICD-10-CM | POA: Diagnosis not present

## 2021-08-01 DIAGNOSIS — D539 Nutritional anemia, unspecified: Secondary | ICD-10-CM

## 2021-08-01 NOTE — Patient Instructions (Signed)
[place] at [facility] Discharge Instructions  You were seen today by Tarri Abernethy PA-C for your low platelets, history of blood clots, and anemia.  Your platelets remain low, but are stable at their usual level.  This may be due to immune system dysfunction, but it is also possible that you have an underlying bone marrow abnormality.  If you have any other significant changes in your blood counts, I would strongly suggest considering bone marrow biopsy in the future.  Continue to take Eliquis for your history of blood clots in your legs and lungs.  If your platelets are less than 50 in the future, we will reassess the risks and benefits of continuing Eliquis.  For the time being, please let us know if you have any major bleeding episodes or are diagnosed with another DVT or PE.  We will also refer you for low-dose CT scan (lung cancer screening) due to your history of tobacco use.  Regarding your weight loss and low appetite, please focus on trying to maintain adequate calories and nutritional intake.  You may want to meet with our dietitian in the future, but we will let you tell us when you are ready for this.  You should also consider using adult nutritional supplements, such as Ensure or Boost.  LABS: Return in 3 months for repeat labs  OTHER TESTS: Low-dose CT scan of chest  MEDICATIONS: No changes to home medications  FOLLOW-UP APPOINTMENT: Office visit in 3 months, after labs   Thank you for choosing [place] at [facility] to provide your oncology and hematology care.  To afford each patient quality time with our provider, please arrive at least 15 minutes before your scheduled appointment time.   If you have a lab appointment with the [facility] please come in thru the Main Entrance and check in at the main information desk.  You need to re-schedule your appointment should you arrive 10 or more minutes late.  We strive to  give you quality time with our providers, and arriving late affects you and other patients whose appointments are after yours.  Also, if you no show three or more times for appointments you may be dismissed from the clinic at the providers discretion.     Again, thank you for choosing [facility]  Our hope is that these requests will decrease the amount of time that you wait before being seen by our physicians.       _____________________________________________________________  Should you have questions after your visit to [facility], please contact our office at [phone] and follow the prompts.  Our office hours are 8:00 a.m. and 4:30 p.m. Monday - Friday.  Please note that voicemails left after 4:00 p.m. may not be returned until the following business day.  We are closed weekends and major holidays.  You do have access to a nurse 24-7, just call the main number to the clinic [phone] and do not press any options, hold on the line and a nurse will answer the phone.    For prescription refill requests, have your pharmacy contact our office and allow 72 hours.    Due to Covid, you will need to wear a mask upon entering the hospital. If you do not have a mask, a mask will be given to you at the Main Entrance upon arrival. For doctor visits, patients may have 1 support person age 4 or older with them.  For treatment visits, patients can not have anyone with them due to social distancing guidelines and our immunocompromised population.

## 2021-08-09 ENCOUNTER — Ambulatory Visit (HOSPITAL_COMMUNITY)
Admission: RE | Admit: 2021-08-09 | Discharge: 2021-08-09 | Disposition: A | Discharge status: Home / Self Care | Payer: Medicare Other | Source: Ambulatory Visit | Attending: Physician Assistant | Admitting: Physician Assistant

## 2021-08-09 ENCOUNTER — Other Ambulatory Visit: Payer: Self-pay

## 2021-08-09 DIAGNOSIS — Z87891 Personal history of nicotine dependence: Secondary | ICD-10-CM | POA: Diagnosis present

## 2021-10-02 ENCOUNTER — Telehealth: Payer: Self-pay | Admitting: Acute Care

## 2021-10-02 NOTE — Telephone Encounter (Signed)
Please call patient / send result letter and let them  know their  low dose Ct was read as a Lung RADS 2: nodules that are benign in appearance and behavior with a very low likelihood of becoming a clinically active cancer due to size or lack of growth. Recommendation per radiology is for a repeat LDCT in 12 months. .Please let them  know we will order and schedule their  annual screening scan for 08/2022. Please let them  know there was notation of CAD on their  scan.  Please remind the patient  that this is a non-gated exam therefore degree or severity of disease  cannot be determined. Please have them  follow up with their PCP regarding potential risk factor modification, dietary therapy or pharmacologic therapy if clinically indicated. Pt.  is not  currently on statin therapy. Please place order for annual  screening scan for  08/2022 and fax results to PCP. Thanks so much.

## 2021-10-29 ENCOUNTER — Other Ambulatory Visit (HOSPITAL_COMMUNITY): Payer: Self-pay

## 2021-10-29 DIAGNOSIS — Z122 Encounter for screening for malignant neoplasm of respiratory organs: Secondary | ICD-10-CM

## 2021-10-29 DIAGNOSIS — Z87891 Personal history of nicotine dependence: Secondary | ICD-10-CM

## 2021-10-31 ENCOUNTER — Inpatient Hospital Stay (HOSPITAL_COMMUNITY): Payer: Medicare Other | Attending: Hematology

## 2021-10-31 DIAGNOSIS — R634 Abnormal weight loss: Secondary | ICD-10-CM | POA: Diagnosis not present

## 2021-10-31 DIAGNOSIS — D696 Thrombocytopenia, unspecified: Secondary | ICD-10-CM | POA: Insufficient documentation

## 2021-10-31 DIAGNOSIS — F17210 Nicotine dependence, cigarettes, uncomplicated: Secondary | ICD-10-CM | POA: Insufficient documentation

## 2021-10-31 DIAGNOSIS — Z86711 Personal history of pulmonary embolism: Secondary | ICD-10-CM | POA: Insufficient documentation

## 2021-10-31 DIAGNOSIS — Z7901 Long term (current) use of anticoagulants: Secondary | ICD-10-CM | POA: Diagnosis not present

## 2021-10-31 DIAGNOSIS — D649 Anemia, unspecified: Secondary | ICD-10-CM | POA: Insufficient documentation

## 2021-10-31 DIAGNOSIS — D539 Nutritional anemia, unspecified: Secondary | ICD-10-CM

## 2021-10-31 LAB — TSH: TSH: 1.836 u[IU]/mL (ref 0.350–4.500)

## 2021-10-31 LAB — CBC WITH DIFFERENTIAL/PLATELET
Basophils Relative: 1 %
Eosinophils Relative: 3 %
HCT: 40 % (ref 39.0–52.0)
Hemoglobin: 13.3 g/dL (ref 13.0–17.0)
Lymphocytes Relative: 31 %
MCH: 36.5 pg — ABNORMAL HIGH (ref 26.0–34.0)
MCHC: 33.3 g/dL (ref 30.0–36.0)
MCV: 109.9 fL — ABNORMAL HIGH (ref 80.0–100.0)
Monocytes Relative: 2 %
Neutrophils Relative %: 63 %
Platelets: 98 10*3/uL — ABNORMAL LOW (ref 150–400)
RBC: 3.64 MIL/uL — ABNORMAL LOW (ref 4.22–5.81)
RDW: 14.4 % (ref 11.5–15.5)
WBC: 4.6 10*3/uL (ref 4.0–10.5)
nRBC: 0 % (ref 0.0–0.2)

## 2021-10-31 LAB — IRON AND TIBC
Iron: 145 ug/dL (ref 45–182)
Saturation Ratios: 46 % — ABNORMAL HIGH (ref 17.9–39.5)
TIBC: 318 ug/dL (ref 250–450)
UIBC: 173 ug/dL

## 2021-10-31 LAB — FERRITIN: Ferritin: 172 ng/mL (ref 24–336)

## 2021-10-31 LAB — FOLATE: Folate: 11.5 ng/mL (ref >5.9)

## 2021-10-31 LAB — VITAMIN B12: Vitamin B-12: 309 pg/mL (ref 180–914)

## 2021-11-02 LAB — HOMOCYSTEINE: Homocysteine: 51.1 umol/L — ABNORMAL HIGH (ref 0.0–19.2)

## 2021-11-05 LAB — METHYLMALONIC ACID, SERUM: Methylmalonic Acid, Quantitative: 515 nmol/L — ABNORMAL HIGH (ref 0–378)

## 2021-11-06 NOTE — Progress Notes (Signed)
[place], [place] [address]   CLINIC:  Medical Oncology/Hematology  PCP:  Celene Squibb, MD [name] [place] [address] [phone]   REASON FOR VISIT:  Follow-up for history of bilateral pulmonary embolism and moderate thrombocytopenia   CURRENT THERAPY: Eliquis 5 mg twice daily  INTERVAL HISTORY:  [name] 76 y.o. male returns for routine follow-up of his thrombocytopenia and his history of pulmonary embolism.  He was last seen by Tarri Abernethy PA-C on 08/01/2021.  At today's visit, he reports feeling fair.  No recent hospitalizations, surgeries, or changes in baseline health status.  Regarding his thrombocytopenia, he admits to easy bruising.  He denies any petechial rash.  He reports that he notices occasional red streaks in his phlegm when he coughs in the morning, which has reportedly been ongoing since he started Eliquis.  He denies any epistaxis, hematochezia, melena, or hematemesis.  No B symptoms such as fever, chills, night sweats.  He continues to have weight loss which she attributes to having little to no appetite.  He reports that he "just does not want to eat, but is trying to force himself."  He drinks about 1 Ensure per week.  His PCP is monitoring his weight.  He continues to take Eliquis for his history of PE/DVT.  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  He has chronic dyspnea on exertion, but denies any acute changes in his respiratory status.  No pleuritic chest pain or palpitations.  He has 40% energy and 40% appetite. He endorses that he is maintaining a stable weight.   REVIEW OF SYSTEMS:  Review of Systems  Constitutional:  Positive for appetite change, fatigue and unexpected weight change. Negative for chills, diaphoresis and fever.  HENT:   Positive for trouble swallowing. Negative for lump/mass  and nosebleeds.   Eyes:  Negative for eye problems.  Respiratory:  Positive for cough and shortness of breath. Negative for hemoptysis.   Cardiovascular:  Negative for chest pain, leg swelling and palpitations.  Gastrointestinal:  Negative for abdominal pain, blood in stool, constipation, diarrhea, nausea and vomiting.  Genitourinary:  Negative for hematuria.   Skin: Negative.   Neurological:  Positive for dizziness. Negative for headaches and light-headedness.  Hematological:  Bruises/bleeds easily.  Psychiatric/Behavioral:  The patient is nervous/anxious.      PAST MEDICAL/SURGICAL HISTORY:  Past Medical History:  Diagnosis Date   Bursitis    l shoulder   Cataract    Elevated bilirubin    H/O: rheumatic fever    Kidney stones    Migraines    Pulmonary embolism (HCC)    Rheumatic fever    Tendonitis    L shoulder   Past Surgical History:  Procedure Laterality Date   ABDOMINAL SURGERY     CATARACT EXTRACTION, BILATERAL     CHOLECYSTECTOMY     CRANIOTOMY Bilateral 09/06/2018   Procedure: BILATERAL CRANIOTOMIES FOR SUBDURAL HEMATOMA EVACUATION;  Surgeon: Erline Levine, MD;  Location: [facility];  Service: Neurosurgery;  Laterality: Bilateral;   EYE SURGERY       SOCIAL HISTORY:  Social History   Socioeconomic History   Marital status: Widowed    Spouse name: Not on file   Number of children: Not on file   Years of education: Not on file   Highest education level: Not on file  Occupational History  Occupation: retired  Tobacco Use   Smoking status: Every Day    Packs/day: 1.00    Years: 50.00    Pack years: 50.00    Types: Cigarettes   Smokeless tobacco: Never  Vaping Use   Vaping Use: Never used  Substance and Sexual Activity   Alcohol use: Yes   Drug use: Never   Sexual activity: Not Currently  Other Topics Concern   Not on file  Social History Narrative   Not on file   Social Determinants of Health   Financial Resource Strain: Not on file  Food  Insecurity: Not on file  Transportation Needs: Not on file  Physical Activity: Not on file  Stress: Not on file  Social Connections: Not on file  Intimate Partner Violence: Not on file    FAMILY HISTORY:  Family History  Problem Relation Age of Onset   Atrial fibrillation Mother    Macular degeneration Mother    Atrial fibrillation Father    Macular degeneration Father    Diabetes Brother    Seizures Brother    Pancreatic cancer Brother    Obesity Brother     CURRENT MEDICATIONS:  Outpatient Encounter Medications as of 11/07/2021  Medication Sig   AIMOVIG 140 MG/ML SOAJ SMARTSIG:1 Injection SUB-Q Once a Month   apixaban (ELIQUIS) 5 MG TABS tablet Take 2 tablets by mouth twice a day for 7 days; then start taking 1 tablet by mouth twice a day on daily basis.   cyclobenzaprine (FLEXERIL) 10 MG tablet Take 10 mg by mouth every 8 (eight) hours as needed.  (Patient not taking: Reported on 08/01/2021)   HYDROcodone-acetaminophen (NORCO/VICODIN) 5-325 MG tablet Take 1 tablet by mouth every 6 (six) hours as needed.  (Patient not taking: Reported on 08/01/2021)   ondansetron (ZOFRAN) 4 MG tablet Take 4 mg by mouth every 6 (six) hours as needed for nausea or vomiting.  (Patient not taking: Reported on 08/01/2021)   Polyethyl Glycol-Propyl Glycol 0.4-0.3 % SOLN Apply 1 drop to eye as needed.   tadalafil (CIALIS) 5 MG tablet Take 5 mg by mouth at bedtime.   No facility-administered encounter medications on file as of 11/07/2021.    ALLERGIES:  Allergies  Allergen Reactions   Flucloxacillin Anaphylaxis     PHYSICAL EXAM:  ECOG PERFORMANCE STATUS: 1 - Symptomatic but completely ambulatory  There were no vitals filed for this visit. There were no vitals filed for this visit. Physical Exam Constitutional:      Appearance: Normal appearance.     Comments: Patient appears slightly cachectic with temporal muscle wasting and folds of loose skin indicating recent weight loss.  HENT:     Head:  Normocephalic and atraumatic.     Mouth/Throat:     Mouth: Mucous membranes are moist.  Eyes:     Extraocular Movements: Extraocular movements intact.     Pupils: Pupils are equal, round, and reactive to light.  Cardiovascular:     Rate and Rhythm: Normal rate and regular rhythm.     Pulses: Normal pulses.     Heart sounds: Normal heart sounds.  Pulmonary:     Effort: Pulmonary effort is normal.     Breath sounds: Normal breath sounds. Decreased air movement present.  Abdominal:     General: Bowel sounds are normal.     Palpations: Abdomen is soft.     Tenderness: There is no abdominal tenderness.  Musculoskeletal:        General: No swelling.  Right lower leg: No edema.     Left lower leg: No edema.  Lymphadenopathy:     Cervical: No cervical adenopathy.  Skin:    General: Skin is warm and dry.  Neurological:     General: No focal deficit present.     Mental Status: He is alert and oriented to person, place, and time.  Psychiatric:        Mood and Affect: Mood normal.        Behavior: Behavior normal.     LABORATORY DATA:  I have reviewed the labs as listed.  CBC    Component Value Date/Time   WBC 4.6 10/31/2021 1229   RBC 3.64 (L) 10/31/2021 1229   HGB 13.3 10/31/2021 1229   HCT 40.0 10/31/2021 1229   PLT 98 (L) 10/31/2021 1229   MCV 109.9 (H) 10/31/2021 1229   MCH 36.5 (H) 10/31/2021 1229   MCHC 33.3 10/31/2021 1229   RDW 14.4 10/31/2021 1229   LYMPHSABS 1.0 07/25/2021 1406   MONOABS 0.4 07/25/2021 1406   EOSABS 0.0 07/25/2021 1406   BASOSABS 0.0 07/25/2021 1406   CMP Latest Ref Rng & Units 03/20/2021 11/06/2020 10/01/2020  Glucose 70 - 99 mg/dL 90 - 116(H)  BUN 8 - 23 mg/dL 12 - 13  Creatinine 0.61 - 1.24 mg/dL 1.26(H) - 0.99  Sodium 135 - 145 mmol/L 136 - 136  Potassium 3.5 - 5.1 mmol/L 3.6 - 4.1  Chloride 98 - 111 mmol/L 103 - 105  CO2 22 - 32 mmol/L 27 - 26  Calcium 8.9 - 10.3 mg/dL 8.9 - 8.3(L)  Total Protein 6.5 - 8.1 g/dL 6.5 6.8 -  Total  Bilirubin 0.3 - 1.2 mg/dL 1.6(H) 2.2(H) -  Alkaline Phos 38 - 126 U/L 90 91 -  AST 15 - 41 U/L 26 27 -  ALT 0 - 44 U/L 17 17 -    DIAGNOSTIC IMAGING:  I have independently reviewed the relevant imaging and discussed with the patient.  ASSESSMENT & PLAN: 1.  Moderate thrombocytopenia - Moderate thrombocytopenia with platelet count between 59-115 since November 2019. - CTAP on 09/19/2019 showed normal spleen and liver. - Nutritional deficiency work-up, SPEP and hepatitis panel negative. - Differential diagnosis includes immune mediated thrombocytopenia versus MDS. - Admits to easy bruising, but denies any major bleeding episodes or petechial rash  - Most recent labs (10/31/2021): Platelets 98, stable at baseline. - Nutritional panel (10/31/2021) showed normal B12 (309), normal folate (11.5), but with elevated methylmalonic acid (515) and homocystine (51.1). - Given his extremely poor appetite and malnutrition, patient may have some nutritional aspect to his thrombocytopenia. - PLAN: Due to elevated MMA and homocystine, we will start patient on vitamin B12 500 mcg daily and folate 400 mcg daily.  - Recommend continuing Eliquis (see below), but if platelets drop below 50, we will reassess risk/benefits. - Repeat labs and RTC in 4 months. - If any significant derangements in CBC, would consider bone marrow biopsy.  (Patient has declined bone marrow biopsy for the time being)   2.  Bilateral pulmonary embolisms/right leg DVT, recurrent, unprovoked - Hospital admission from 10/15/2019 through 10/17/2019 with hemoptysis. - CT chest on 10/16/2019 showed acute bilateral pulmonary emboli greater within the left lower lobe with no evidence of right heart strain.  Dopplers showed no evidence of DVT. - Eliquis was apparently discontinued in October 2021. - CT angio on 09/30/2020 shows acute PE in segmental and subsegmental branches supplying the right lower, right middle and left lower and  left upper  lobes. - He was found to have right leg DVT in November 2021 - " extensive right lower extremity DVT extending throughout femoral vein and deep femoral vein, through popliteal vein and into the calf" - Patient was started back on Eliquis in November 2021 after diagnosis of recurrent PE/DVT - He remains on Eliquis 5 mg twice daily, which he is tolerating well  - Patient does have some post thrombotic changes of right lower extremity (intermittent right lower extremity edema and right ankle varicose veins), but does not have any acute signs or symptoms concerning for recurrent DVT or PE at this time  - PLAN: Continue indefinite Eliquis.  No bleeding issues reported.   3.  Macrocytosis +/- anemia - High MCV with intermittent anemia.  - No known history of liver disease, CTAP on 09/19/2019 showed normal spleen and liver. - TSH normal (10/31/2021) - Differential diagnosis includes early myelodysplasia versus nutritional deficiency - Most recent labs (10/31/2021): MCV 109.9 with normal Hgb 13.3 - Nutritional panel (10/31/2021) showed normal B12 (309), normal folate (11.5), but with elevated methylmalonic acid (515) and homocystine (51.1). - No B symptoms  - PLAN: Due to elevated MMA and homocystine, we will start patient on vitamin B12 500 mcg daily and folate 400 mcg daily. - Repeat labs (CBC, B12, folate, methylmalonic acid, homocystine) and RTC in 4 months. - If any significant derangements, would consider bone marrow biopsy.  (Discussed possible bone marrow biopsy with patient, but he is hesitant - reports that he had a bone marrow biopsy in the 1970s, but he does not recall the reason or the results.  Apparently he had some back pain after this and is hesitant to get another biopsy.)   4.  Weight loss - Patient has lost 15-20 pounds since December 2021 - Weight today is 128 pounds, down 3 lbs from last visit in September 2022. - Reports decreased appetite  - LDCT chest (October 2022) did not  show any signs of active malignancy - PLAN: Discussed with patient some tips to increase caloric intake, including trying adult beverages such as Ensure and Boost.  Referral to dietitian was offered, but patient declined at this time.  Patient declined appetite stimulant medications.  Patient declined further work-up such as CT A/P to evaluate for occult malignancy.   5.  Tobacco abuse - He has been smoking 1 PPD x50 years, continues to smoke 1 pack/day cigarettes - LDCT scan of chest (08/09/2021): Lung RADS 2, benign appearance or behavior; recommended repeat LDCT chest in 12 months. - PLAN: We will schedule patient for repeat LDCT scan of chest in October 2023.   PLAN SUMMARY & DISPOSITION: Labs in 4 months RTC the week after labs  All questions were answered. The patient knows to call the clinic with any problems, questions or concerns.  Medical decision making: Moderate  Time spent on visit: I spent 20 minutes counseling the patient face to face. The total time spent in the appointment was 30 minutes and more than 50% was on counseling.   Harriett Rush, PA-C  11/07/2021 7:44 PM

## 2021-11-07 ENCOUNTER — Other Ambulatory Visit: Payer: Self-pay

## 2021-11-07 ENCOUNTER — Inpatient Hospital Stay (HOSPITAL_COMMUNITY): Payer: Medicare Other | Attending: Hematology | Admitting: Physician Assistant

## 2021-11-07 VITALS — BP 144/92 | HR 85 | Temp 98.9°F | Resp 16 | Ht 70.0 in | Wt 129.0 lb

## 2021-11-07 DIAGNOSIS — D649 Anemia, unspecified: Secondary | ICD-10-CM | POA: Insufficient documentation

## 2021-11-07 DIAGNOSIS — D696 Thrombocytopenia, unspecified: Secondary | ICD-10-CM | POA: Diagnosis present

## 2021-11-07 DIAGNOSIS — D539 Nutritional anemia, unspecified: Secondary | ICD-10-CM | POA: Diagnosis not present

## 2021-11-07 DIAGNOSIS — Z86711 Personal history of pulmonary embolism: Secondary | ICD-10-CM | POA: Insufficient documentation

## 2021-11-07 DIAGNOSIS — E538 Deficiency of other specified B group vitamins: Secondary | ICD-10-CM | POA: Diagnosis not present

## 2021-11-07 DIAGNOSIS — Z7901 Long term (current) use of anticoagulants: Secondary | ICD-10-CM | POA: Insufficient documentation

## 2021-11-07 MED ORDER — FOLIC ACID 400 MCG PO TABS
400.0000 ug | ORAL_TABLET | Freq: Every day | ORAL | 11 refills | Status: AC
Start: 2021-11-07 — End: ?

## 2021-11-07 MED ORDER — CYANOCOBALAMIN 500 MCG PO TABS
500.0000 ug | ORAL_TABLET | Freq: Every day | ORAL | 11 refills | Status: DC
Start: 2021-11-07 — End: 2024-08-08

## 2021-11-07 NOTE — Patient Instructions (Signed)
[facility] at [facility] Discharge Instructions  You were seen today by Rojelio Brenner PA-C for your low platelets and your history of blood clots in your legs/lungs.  Your platelets are low, but stable at their usual level.  Most recent platelets were 98.  Your labs showed some borderline deficiency and vitamin B12 and folate.  We will start you on B12 and folic acid supplement.  Continue Eliquis indefinitely for prevention of recurrent blood clots.  Seek immediate medical attention if you experience any signs of major bleeding events.  Regarding your weight loss, make sure that you are eating adequate nutrition.  Drink at least 1 Ensure beverage per day.  If you continue to lose weight, we will likely need to start you on appetite stimulating medications and run other tests such as CT scan.  LABS: Return in 4 months for repeat labs  OTHER TESTS: None at this time  MEDICATIONS: - Continue Eliquis - START vitamin B12 (cyanocobalamin) 500 mcg daily - START folic acid 400 mcg daily  FOLLOW-UP APPOINTMENT: Office visit in 4 months, the week after labs   Thank you for choosing [facility] at [facility] to provide your oncology and hematology care.  To afford each patient quality time with our provider, please arrive at least 15 minutes before your scheduled appointment time.   If you have a lab appointment with the Cancer Center please come in thru the Main Entrance and check in at the main information desk.  You need to re-schedule your appointment should you arrive 10 or more minutes late.  We strive to give you quality time with our providers, and arriving late affects you and other patients whose appointments are after yours.  Also, if you no show three or more times for appointments you may be dismissed from the clinic at the providers discretion.     Again, thank you for choosing [facility].  Our hope is that these  requests will decrease the amount of time that you wait before being seen by our physicians.       _____________________________________________________________  Should you have questions after your visit to [facility], please contact our office at [phone] and follow the prompts.  Our office hours are 8:00 a.m. and 4:30 p.m. Monday - Friday.  Please note that voicemails left after 4:00 p.m. may not be returned until the following business day.  We are closed weekends and major holidays.  You do have access to a nurse 24-7, just call the main number to the clinic [phone] and do not press any options, hold on the line and a nurse will answer the phone.    For prescription refill requests, have your pharmacy contact our office and allow 72 hours.    Due to Covid, you will need to wear a mask upon entering the hospital. If you do not have a mask, a mask will be given to you at the Main Entrance upon arrival. For doctor visits, patients may have 1 support person age 40 or older with them. For treatment visits, patients can not have anyone with them due to social distancing guidelines and our immunocompromised population.

## 2021-11-21 ENCOUNTER — Emergency Department (HOSPITAL_COMMUNITY): Payer: Medicare Other

## 2021-11-21 ENCOUNTER — Other Ambulatory Visit: Payer: Self-pay

## 2021-11-21 ENCOUNTER — Observation Stay (HOSPITAL_BASED_OUTPATIENT_CLINIC_OR_DEPARTMENT_OTHER): Payer: Medicare Other

## 2021-11-21 ENCOUNTER — Encounter (HOSPITAL_COMMUNITY): Payer: Self-pay

## 2021-11-21 ENCOUNTER — Observation Stay (HOSPITAL_COMMUNITY): Payer: Medicare Other

## 2021-11-21 ENCOUNTER — Inpatient Hospital Stay (HOSPITAL_COMMUNITY)
Admission: EM | Admit: 2021-11-21 | Discharge: 2021-11-23 | DRG: 682 | Disposition: A | Discharge status: Home / Self Care | Payer: Medicare Other | Attending: Family Medicine | Admitting: Family Medicine

## 2021-11-21 DIAGNOSIS — N179 Acute kidney failure, unspecified: Principal | ICD-10-CM | POA: Diagnosis present

## 2021-11-21 DIAGNOSIS — E538 Deficiency of other specified B group vitamins: Secondary | ICD-10-CM | POA: Diagnosis present

## 2021-11-21 DIAGNOSIS — Z880 Allergy status to penicillin: Secondary | ICD-10-CM

## 2021-11-21 DIAGNOSIS — Z7901 Long term (current) use of anticoagulants: Secondary | ICD-10-CM

## 2021-11-21 DIAGNOSIS — Z800 Family history of malignant neoplasm of digestive organs: Secondary | ICD-10-CM

## 2021-11-21 DIAGNOSIS — D696 Thrombocytopenia, unspecified: Secondary | ICD-10-CM | POA: Diagnosis present

## 2021-11-21 DIAGNOSIS — R7401 Elevation of levels of liver transaminase levels: Secondary | ICD-10-CM | POA: Diagnosis present

## 2021-11-21 DIAGNOSIS — Z87442 Personal history of urinary calculi: Secondary | ICD-10-CM

## 2021-11-21 DIAGNOSIS — E43 Unspecified severe protein-calorie malnutrition: Secondary | ICD-10-CM | POA: Diagnosis present

## 2021-11-21 DIAGNOSIS — R55 Syncope and collapse: Secondary | ICD-10-CM | POA: Diagnosis present

## 2021-11-21 DIAGNOSIS — D649 Anemia, unspecified: Secondary | ICD-10-CM

## 2021-11-21 DIAGNOSIS — R7989 Other specified abnormal findings of blood chemistry: Secondary | ICD-10-CM

## 2021-11-21 DIAGNOSIS — I2699 Other pulmonary embolism without acute cor pulmonale: Secondary | ICD-10-CM | POA: Diagnosis present

## 2021-11-21 DIAGNOSIS — F17210 Nicotine dependence, cigarettes, uncomplicated: Secondary | ICD-10-CM | POA: Diagnosis present

## 2021-11-21 DIAGNOSIS — Z833 Family history of diabetes mellitus: Secondary | ICD-10-CM

## 2021-11-21 DIAGNOSIS — N182 Chronic kidney disease, stage 2 (mild): Secondary | ICD-10-CM | POA: Diagnosis present

## 2021-11-21 DIAGNOSIS — Z20822 Contact with and (suspected) exposure to covid-19: Secondary | ICD-10-CM | POA: Diagnosis present

## 2021-11-21 DIAGNOSIS — Z681 Body mass index (BMI) 19 or less, adult: Secondary | ICD-10-CM

## 2021-11-21 DIAGNOSIS — Z86711 Personal history of pulmonary embolism: Secondary | ICD-10-CM

## 2021-11-21 DIAGNOSIS — Z79899 Other long term (current) drug therapy: Secondary | ICD-10-CM

## 2021-11-21 LAB — ECHOCARDIOGRAM COMPLETE
AR max vel: 2.87 cm2
AV Area VTI: 3.07 cm2
AV Area mean vel: 2.85 cm2
AV Mean grad: 2.6 mmHg
AV Peak grad: 4.2 mmHg
Ao pk vel: 1.03 m/s
Area-P 1/2: 2.83 cm2
Height: 70 in
S' Lateral: 2.4 cm
Weight: 2048 oz

## 2021-11-21 LAB — COMPREHENSIVE METABOLIC PANEL
ALT: 36 U/L (ref 0–44)
AST: 69 U/L — ABNORMAL HIGH (ref 15–41)
Albumin: 3.4 g/dL — ABNORMAL LOW (ref 3.5–5.0)
Alkaline Phosphatase: 114 U/L (ref 38–126)
Anion gap: 8 (ref 5–15)
BUN: 15 mg/dL (ref 8–23)
CO2: 24 mmol/L (ref 22–32)
Calcium: 8.7 mg/dL — ABNORMAL LOW (ref 8.9–10.3)
Chloride: 106 mmol/L (ref 98–111)
Creatinine, Ser: 1.47 mg/dL — ABNORMAL HIGH (ref 0.61–1.24)
GFR, Estimated: 49 mL/min — ABNORMAL LOW (ref >60)
Glucose, Bld: 82 mg/dL (ref 70–99)
Potassium: 4.3 mmol/L (ref 3.5–5.1)
Sodium: 138 mmol/L (ref 135–145)
Total Bilirubin: 1.3 mg/dL — ABNORMAL HIGH (ref 0.3–1.2)
Total Protein: 5.7 g/dL — ABNORMAL LOW (ref 6.5–8.1)

## 2021-11-21 LAB — URINALYSIS, ROUTINE W REFLEX MICROSCOPIC
Bacteria, UA: NONE SEEN
Bilirubin Urine: NEGATIVE
Glucose, UA: NEGATIVE mg/dL
Ketones, ur: NEGATIVE mg/dL
Leukocytes,Ua: NEGATIVE
Nitrite: NEGATIVE
Protein, ur: NEGATIVE mg/dL
Specific Gravity, Urine: 1.004 — ABNORMAL LOW (ref 1.005–1.030)
pH: 6 (ref 5.0–8.0)

## 2021-11-21 LAB — POC OCCULT BLOOD, ED: 11800305105: NEGATIVE

## 2021-11-21 LAB — CBC WITH DIFFERENTIAL/PLATELET
Abs Immature Granulocytes: 0.02 10*3/uL (ref 0.00–0.07)
Basophils Absolute: 0 10*3/uL (ref 0.0–0.1)
Basophils Relative: 0 %
Eosinophils Absolute: 0 10*3/uL (ref 0.0–0.5)
Eosinophils Relative: 0 %
HCT: 31.4 % — ABNORMAL LOW (ref 39.0–52.0)
Hemoglobin: 10.7 g/dL — ABNORMAL LOW (ref 13.0–17.0)
Immature Granulocytes: 0 %
Lymphocytes Relative: 12 %
Lymphs Abs: 0.7 10*3/uL (ref 0.7–4.0)
MCH: 37.5 pg — ABNORMAL HIGH (ref 26.0–34.0)
MCHC: 34.1 g/dL (ref 30.0–36.0)
MCV: 110.2 fL — ABNORMAL HIGH (ref 80.0–100.0)
Monocytes Absolute: 0.7 10*3/uL (ref 0.1–1.0)
Monocytes Relative: 11 %
Neutro Abs: 4.7 10*3/uL (ref 1.7–7.7)
Neutrophils Relative %: 77 %
Platelets: 76 10*3/uL — ABNORMAL LOW (ref 150–400)
RBC: 2.85 MIL/uL — ABNORMAL LOW (ref 4.22–5.81)
RDW: 14.7 % (ref 11.5–15.5)
WBC: 6.1 10*3/uL (ref 4.0–10.5)
nRBC: 0 % (ref 0.0–0.2)

## 2021-11-21 LAB — RESP PANEL BY RT-PCR (FLU A&B, COVID) ARPGX2
Influenza A by PCR: NEGATIVE
Influenza B by PCR: NEGATIVE
SARS Coronavirus 2 by RT PCR: NEGATIVE

## 2021-11-21 LAB — PROTIME-INR
INR: 1.3 — ABNORMAL HIGH (ref 0.8–1.2)
Prothrombin Time: 16.2 seconds — ABNORMAL HIGH (ref 11.4–15.2)

## 2021-11-21 LAB — TSH: TSH: 1.068 u[IU]/mL (ref 0.350–4.500)

## 2021-11-21 MED ORDER — SENNOSIDES-DOCUSATE SODIUM 8.6-50 MG PO TABS: 1.0000 | ORAL_TABLET | Freq: Every evening | ORAL | Status: DC | PRN

## 2021-11-21 MED ORDER — VITAMIN B-12 100 MCG PO TABS
500.0000 ug | ORAL_TABLET | Freq: Every day | ORAL | Status: DC
  Administered 2021-11-22 – 2021-11-23 (×2): 500 ug via ORAL
  Filled 2021-11-21 (×2): qty 5

## 2021-11-21 MED ORDER — FOLIC ACID 1 MG PO TABS
500.0000 ug | ORAL_TABLET | Freq: Every day | ORAL | Status: DC
  Administered 2021-11-22 – 2021-11-23 (×2): 0.5 mg via ORAL
  Filled 2021-11-21 (×2): qty 1

## 2021-11-21 MED ORDER — ACETAMINOPHEN 325 MG PO TABS: 650.0000 mg | ORAL_TABLET | Freq: Four times a day (QID) | ORAL | Status: DC | PRN

## 2021-11-21 MED ORDER — HYDROCODONE-ACETAMINOPHEN 5-325 MG PO TABS
1.0000 | ORAL_TABLET | Freq: Four times a day (QID) | ORAL | Status: DC | PRN
  Administered 2021-11-21 – 2021-11-23 (×5): 1 via ORAL
  Filled 2021-11-21 (×5): qty 1

## 2021-11-21 MED ORDER — APIXABAN 5 MG PO TABS: 5.0000 mg | ORAL_TABLET | Freq: Two times a day (BID) | ORAL | Status: DC

## 2021-11-21 MED ORDER — CYCLOBENZAPRINE HCL 10 MG PO TABS
10.0000 mg | ORAL_TABLET | Freq: Three times a day (TID) | ORAL | Status: DC | PRN
  Administered 2021-11-21 – 2021-11-22 (×2): 10 mg via ORAL
  Filled 2021-11-21 (×2): qty 1

## 2021-11-21 MED ORDER — ENSURE ENLIVE PO LIQD
237.0000 mL | Freq: Two times a day (BID) | ORAL | Status: DC
  Administered 2021-11-22 – 2021-11-23 (×3): 237 mL via ORAL

## 2021-11-21 MED ORDER — ONDANSETRON HCL 4 MG/2ML IJ SOLN: 4.0000 mg | Freq: Four times a day (QID) | INTRAMUSCULAR | Status: DC | PRN

## 2021-11-21 MED ORDER — ONDANSETRON HCL 4 MG PO TABS: 4.0000 mg | ORAL_TABLET | Freq: Four times a day (QID) | ORAL | Status: DC | PRN

## 2021-11-21 MED ORDER — SODIUM CHLORIDE 0.9% FLUSH
3.0000 mL | Freq: Two times a day (BID) | INTRAVENOUS | Status: DC
  Administered 2021-11-22 (×2): 3 mL via INTRAVENOUS

## 2021-11-21 MED ORDER — PERFLUTREN LIPID MICROSPHERE
1.0000 mL | INTRAVENOUS | Status: AC | PRN
  Administered 2021-11-21: 2 mL via INTRAVENOUS
  Filled 2021-11-21: qty 10

## 2021-11-21 MED ORDER — ACETAMINOPHEN 650 MG RE SUPP: 650.0000 mg | Freq: Four times a day (QID) | RECTAL | Status: DC | PRN

## 2021-11-21 MED ORDER — APIXABAN 5 MG PO TABS
5.0000 mg | ORAL_TABLET | Freq: Two times a day (BID) | ORAL | Status: DC
  Administered 2021-11-21 – 2021-11-23 (×4): 5 mg via ORAL
  Filled 2021-11-21 (×4): qty 1

## 2021-11-21 MED ORDER — SODIUM CHLORIDE 0.9 % IV SOLN: INTRAVENOUS | Status: DC

## 2021-11-21 NOTE — H&P (Signed)
History and Physical    Brian Stark WNI:[id number] DOB: 1946-07-11 DOA: 11/21/2021  PCP: Benita Stabile, MD   Patient coming from:  home.  I have personally briefly reviewed patient's old medical records in [facility].  Chief Complaint: Syncope after taking shower.  HPI: Brian Stark is a 76 y.o. male with PMH significant for history of blood clots, muscle spasm,  vitamin B12 and folate deficiency, thrombocytopenia presented in the ED s/p syncopal episode after taking shower.  Patient reports that he was in his usual state of health in the morning and then he went to take shower,  immediately after he finished shower, he passed out.  He denies any prodromal symptoms ,denies any dizziness ,  head injury,  stated he was in the tub for some time,  does not remember the time,  His son was able to get him out of the tub,  he was able to walk towards the bed.  Patient has been ambulating well, denies any headache, dizziness, nausea, vomiting, palpitations.  Patient does report having similar episodes in the past and found to have subdural hematoma.  Patient has been having problem with platelet count.  ED Course: He was hemodynamically stable. HR 88, RR 19, BP 107/65, temp 98.5, SPO2 98% on room air Labs include sodium 138, potassium 4.3, chloride 106, bicarb 24, glucose 85, BUN 15, creatinine 1.47, calcium 8.7, anion gap 8, alkaline phosphatase 114, albumin 3.4, AST 69, ALT 39, total protein 5.7, total bilirubin 1.3, WBC 6.1, hemoglobin 10.7, hematocrit 31.4, MCV 110.2, platelets 76, PT 16.2, INR 1.3, glucose 82, TSH 1.068, COVID-negative, influenza negative, UA: Moderate hemoglobin, nitrates negative, leukocyte esterase negative bacteria negative.  CT head: No acute intracranial abnormality or hemorrhage. CT C-spine no acute fracture or malalignment of cervical spine. CT chest: No abnormality noted. X-ray pelvis no fracture.  Review of Systems:  Review of Systems  Constitutional:  Negative.   HENT: Negative.    Eyes: Negative.   Respiratory: Negative.    Cardiovascular: Negative.   Gastrointestinal: Negative.   Genitourinary: Negative.   Musculoskeletal:  Positive for back pain and falls.  Skin: Negative.   Neurological:        Syncopal episode.  Endo/Heme/Allergies: Negative.   Psychiatric/Behavioral: Negative.     Past Medical History:  Diagnosis Date   Bursitis    l shoulder   Cataract    Elevated bilirubin    H/O: rheumatic fever    Kidney stones    Migraines    Pulmonary embolism (HCC)    Rheumatic fever    Tendonitis    L shoulder    Past Surgical History:  Procedure Laterality Date   ABDOMINAL SURGERY     CATARACT EXTRACTION, BILATERAL     CHOLECYSTECTOMY     CRANIOTOMY Bilateral 09/06/2018   Procedure: BILATERAL CRANIOTOMIES FOR SUBDURAL HEMATOMA EVACUATION;  Surgeon: Maeola Harman, MD;  Location: [facility] OR;  Service: Neurosurgery;  Laterality: Bilateral;   EYE SURGERY       reports that he has been smoking cigarettes. He has a 50.00 pack-year smoking history. He has never used smokeless tobacco. He reports current alcohol use. He reports that he does not use drugs.  Allergies  Allergen Reactions   Flucloxacillin Anaphylaxis    Family History  Problem Relation Age of Onset   Atrial fibrillation Mother    Macular degeneration Mother    Atrial fibrillation Father    Macular degeneration Father    Diabetes Brother  Seizures Brother    Pancreatic cancer Brother    Obesity Brother     Family history reviewed and not pertinent.  Prior to Admission medications   Medication Sig Start Date End Date Taking? Authorizing Provider  AIMOVIG 140 MG/ML SOAJ SMARTSIG:1 Injection SUB-Q Once a Month 05/28/21   [provider]  apixaban (ELIQUIS) 5 MG TABS tablet Take 2 tablets by mouth twice a day for 7 days; then start taking 1 tablet by mouth twice a day on daily basis. 10/01/20   Barton Dubois, MD  cyclobenzaprine (FLEXERIL) 10 MG  tablet Take 10 mg by mouth every 8 (eight) hours as needed. 08/18/19   [provider]  folic acid (FOLVITE) A999333 MCG tablet Take 1 tablet (400 mcg total) by mouth daily. 11/07/21   Harriett Rush, PA-C  HYDROcodone-acetaminophen (NORCO/VICODIN) 5-325 MG tablet Take 1 tablet by mouth every 6 (six) hours as needed. 08/18/19   [provider]  ondansetron (ZOFRAN) 4 MG tablet Take 4 mg by mouth every 6 (six) hours as needed for nausea or vomiting.  Patient not taking: Reported on 11/07/2021    [provider]  Polyethyl Glycol-Propyl Glycol 0.4-0.3 % SOLN Apply 1 drop to eye as needed.    [provider]  vitamin B-12 (CYANOCOBALAMIN) 500 MCG tablet Take 1 tablet (500 mcg total) by mouth daily. 11/07/21   Harriett Rush, PA-C    Physical Exam: Vitals:   11/21/21 1200 11/21/21 1230 11/21/21 1300 11/21/21 1330  BP: 129/74 120/77 131/83 (!) 110/59  Pulse: 89 88 95 89  Resp: (!) 21 15 (!) 26 16  Temp:      TempSrc:      SpO2: 99% 96% 98% 96%  Weight:      Height:        Constitutional: Appears comfortable, not in any acute distress. Vitals:   11/21/21 1200 11/21/21 1230 11/21/21 1300 11/21/21 1330  BP: 129/74 120/77 131/83 (!) 110/59  Pulse: 89 88 95 89  Resp: (!) 21 15 (!) 26 16  Temp:      TempSrc:      SpO2: 99% 96% 98% 96%  Weight:      Height:       Eyes: PERRL, lids and conjunctivae normal ENMT: Mucous membranes are moist. Posterior pharynx without exudate.   Neck: normal, supple, no masses, no thyromegaly Respiratory: Clear to auscultation bilaterally, no wheezing, no crackles, no accessory muscle use. Cardiovascular: S1-S2 heard, regular rate and rhythm, no murmur. Abdomen: Abdomen soft, nontender, nondistended, BS+ Musculoskeletal: no clubbing / cyanosis. No joint deformity upper and lower extremities. Good ROM, no contractures. Normal muscle tone.  Skin: no rashes, lesions, ulcers. No induration Neurologic: CN 2-12 grossly  intact. Sensation intact, DTR normal. Strength 5/5 in all 4.  Psychiatric: Normal judgment and insight. Alert and oriented x 3. Normal mood.    Labs on Admission: I have personally reviewed following labs and imaging studies  CBC: Recent Labs  Lab 11/21/21 1142  WBC 6.1  NEUTROABS 4.7  HGB 10.7*  HCT 31.4*  MCV 110.2*  PLT 76*   Basic Metabolic Panel: Recent Labs  Lab 11/21/21 1142  NA 138  K 4.3  CL 106  CO2 24  GLUCOSE 82  BUN 15  CREATININE 1.47*  CALCIUM 8.7*   GFR: Estimated Creatinine Clearance: 35.7 mL/min (A) (by C-G formula based on SCr of 1.47 mg/dL (H)). Liver Function Tests: Recent Labs  Lab 11/21/21 1142  AST 69*  ALT 36  ALKPHOS 114  BILITOT 1.3*  PROT 5.7*  ALBUMIN 3.4*   No results for input(s): LIPASE, AMYLASE in the last 168 hours. No results for input(s): AMMONIA in the last 168 hours. Coagulation Profile: Recent Labs  Lab 11/21/21 1142  INR 1.3*   Cardiac Enzymes: No results for input(s): CKTOTAL, CKMB, CKMBINDEX, TROPONINI in the last 168 hours. BNP (last 3 results) No results for input(s): PROBNP in the last 8760 hours. HbA1C: No results for input(s): HGBA1C in the last 72 hours. CBG: No results for input(s): GLUCAP in the last 168 hours. Lipid Profile: No results for input(s): CHOL, HDL, LDLCALC, TRIG, CHOLHDL, LDLDIRECT in the last 72 hours. Thyroid Function Tests: No results for input(s): TSH, T4TOTAL, FREET4, T3FREE, THYROIDAB in the last 72 hours. Anemia Panel: No results for input(s): VITAMINB12, FOLATE, FERRITIN, TIBC, IRON, RETICCTPCT in the last 72 hours. Urine analysis:    Component Value Date/Time   COLORURINE YELLOW 11/21/2021 [place] 11/21/2021 1223   LABSPEC 1.004 (L) 11/21/2021 1223   PHURINE 6.0 11/21/2021 1223   GLUCOSEU NEGATIVE 11/21/2021 1223   HGBUR MODERATE (A) 11/21/2021 1223   BILIRUBINUR NEGATIVE 11/21/2021 1223   KETONESUR NEGATIVE 11/21/2021 1223   PROTEINUR NEGATIVE  11/21/2021 1223   NITRITE NEGATIVE 11/21/2021 1223   LEUKOCYTESUR NEGATIVE 11/21/2021 1223    Radiological Exams on Admission: DG Pelvis 1-2 Views  Result Date: 11/21/2021 CLINICAL DATA:  Fall. EXAM: PELVIS - 1-2 VIEW COMPARISON:  CT abdomen pelvis dated September 19, 2019. FINDINGS: There is no evidence of pelvic fracture or diastasis. No pelvic bone lesions are seen. IMPRESSION: Negative. Electronically Signed   By: Titus Dubin M.D.   On: 11/21/2021 13:12   CT Head Wo Contrast  Result Date: 11/21/2021 CLINICAL DATA:  Fall, trauma EXAM: CT HEAD WITHOUT CONTRAST TECHNIQUE: Contiguous axial images were obtained from the base of the skull through the vertex without intravenous contrast. RADIATION DOSE REDUCTION: This exam was performed according to the departmental dose-optimization program which includes automated exposure control, adjustment of the mA and/or kV according to patient size and/or use of iterative reconstruction technique. COMPARISON:  CT head 10/21/2018 FINDINGS: Brain: There is no evidence of acute intracranial hemorrhage, extra-axial fluid collection, or acute infarct. Parenchymal volume is within normal limits. The ventricles are normal in size. Patchy hypodensity in the subcortical and periventricular white matter likely reflects sequela of mild chronic white matter microangiopathy. There is no mass lesion.  There is no midline shift. Vascular: No hyperdense vessel or unexpected calcification. Skull: Postsurgical changes reflecting bifrontal craniotomy are noted. There is no calvarial fracture or suspicious osseous lesion. Sinuses/Orbits: The imaged paranasal sinuses are clear. Bilateral lens implants are in place. The globes and orbits are otherwise unremarkable. Other: None. IMPRESSION: No acute intracranial hemorrhage or calvarial fracture. Electronically Signed   By: Valetta Mole M.D.   On: 11/21/2021 13:13   CT Chest Wo Contrast  Result Date: 11/21/2021 CLINICAL DATA:  Fall,  tenderness to palpation in midline T-spine and posterior right ribs EXAM: CT CHEST WITHOUT CONTRAST TECHNIQUE: Multidetector CT imaging of the chest was performed following the standard protocol without IV contrast. RADIATION DOSE REDUCTION: This exam was performed according to the departmental dose-optimization program which includes automated exposure control, adjustment of the mA and/or kV according to patient size and/or use of iterative reconstruction technique. COMPARISON:  CT chest 08/09/2021 FINDINGS: Cardiovascular: The heart is not enlarged. There is no pericardial effusion. There is minimal calcification of the thoracic aorta. The vasculature  is otherwise unremarkable, as can be evaluated in the absence of intravenous contrast. Mediastinum/Nodes: The thyroid is unremarkable. The esophagus is diffusely patulous, unchanged. There is no mediastinal or axillary lymphadenopathy. There is no bulky hilar adenopathy. Lungs/Pleura: The trachea and central airways are patent. The lungs are well inflated. Linear opacities in the right base likely reflect atelectasis and/or scar. There is no focal consolidation. There is no pulmonary edema. There is no pleural effusion or pneumothorax. There is no evidence of traumatic parenchymal injury. There are no new or enlarging pulmonary nodules. Upper Abdomen: A calcification in the right hepatic lobe is unchanged. Cholecystectomy clips are again noted. There is a left upper pole renal cyst. There is moderate left hydronephrosis, incompletely imaged, similar in appearance to the partially imaged kidney from the CTA chest from 09/30/2020. Musculoskeletal: No rib fracture is seen. There is no evidence of thoracic spine fracture. There is no suspicious osseous lesion. IMPRESSION: 1. No evidence of acute traumatic injury in the chest. 2. No focal airspace disease or pleural effusion. Electronically Signed   By: Valetta Mole M.D.   On: 11/21/2021 13:05   CT Cervical Spine Wo  Contrast  Result Date: 11/21/2021 CLINICAL DATA:  Trauma EXAM: CT CERVICAL SPINE WITHOUT CONTRAST TECHNIQUE: Multidetector CT imaging of the cervical spine was performed without intravenous contrast. Multiplanar CT image reconstructions were also generated. RADIATION DOSE REDUCTION: This exam was performed according to the departmental dose-optimization program which includes automated exposure control, adjustment of the mA and/or kV according to patient size and/or use of iterative reconstruction technique. COMPARISON:  None. FINDINGS: Alignment: There is grade 1 retrolisthesis of C3 on C4. There is no jumped or perched facets or other evidence of traumatic malalignment. Skull base and vertebrae: Skull base alignment is maintained. Vertebral body heights are preserved. There is no evidence of acute fracture. Soft tissues and spinal canal: No prevertebral fluid or swelling. No visible canal hematoma. Disc levels: There is mild multilevel disc space narrowing, degenerative endplate change, and facet arthropathy. There is no high-grade spinal canal stenosis. There is severe left neural foraminal stenosis at C3-C4 and C4-C5, and moderate left neural foraminal stenosis at C5-C6. Upper chest: The lungs are assessed on the separately dictated CT chest. Other: None. IMPRESSION: 1. No acute fracture or traumatic malalignment of the cervical spine. 2. Multilevel degenerative changes as above. Electronically Signed   By: Valetta Mole M.D.   On: 11/21/2021 13:09    EKG: Independently reviewed.  Normal sinus rhythm, left anterior fascicular block.  Assessment/Plan Principal Problem:   Syncope and collapse Active Problems:   PE (pulmonary thromboembolism) (HCC)   Thrombocytopenia (HCC)   Vitamin B 12 deficiency  Syncope: Patient briefly passed out after taking shower. Patient denies any prodromal symptoms before passing out. Patient reports feeling generalized weakness otherwise denies any other symptoms. Admit  for observation Continue telemetry monitoring Obtain 2D echocardiogram, carotid duplex. CT head and other imaging unremarkable. PT and OT evaluation.  Continue gentle IV hydration  History of pulmonary embolism: Continue Eliquis  Thrombocytopenia: No signs of any bleeding, continue to monitor.  Vitamin B12 deficiency: Continue vitamin B12 supplementation. Continue folic acid    DVT prophylaxis: Eliquis Code Status: Full code. Family Communication: No family at bed side. Disposition Plan:   Status is: Observation  The patient remains OBS appropriate and will d/c before 2 midnights.   Admitted for syncope workup.   Consults called: None Admission status: Observation   Shawna Clamp MD [facility]   If  7PM-7AM, please contact night-coverage   11/21/2021, 2:20 PM

## 2021-11-21 NOTE — ED Notes (Signed)
Echo at bedside

## 2021-11-21 NOTE — ED Provider Notes (Signed)
[facility] EMERGENCY DEPARTMENT Provider Note   CSN: [id number] Arrival date & time: 11/21/21  [id number]     History  Chief Complaint  Patient presents with   Brian Stark is a 76 y.o. male presenting for evaluation of syncope.  Patient states today he had finished showering when he suddenly passed out.  He did not go from sitting to standing.  He did not feel dizzy or lightheaded prior to the episode.  He fell into the tub, and was unable to get out for several hours.  Eventually his son was able to help him out.  He has ambulated since then, but is overall weak when compared to his baseline.  He has pain in both shoulders and his left knee.  He is on Eliquis due to blood clots.  He has been having lots of issues with his platelet levels recently.  He does not know if he hit his head, does not know if he lost consciousness.  No neck or back pain He reports recent dysuria, but no hematuria or urinary frequency.  No fevers, nausea, vomiting, abd pain.  No chest pain, shortness breath, cough or normal bowel movements.  He has been eating and drinking well  Patient states several years ago he had a subdural bleed, states the episode today was similar.   HPI     Home Medications Prior to Admission medications   Medication Sig Start Date End Date Taking? Authorizing Provider  AIMOVIG 140 MG/ML SOAJ SMARTSIG:1 Injection SUB-Q Once a Month 05/28/21   [provider]  apixaban (ELIQUIS) 5 MG TABS tablet Take 2 tablets by mouth twice a day for 7 days; then start taking 1 tablet by mouth twice a day on daily basis. 10/01/20   Barton Dubois, MD  cyclobenzaprine (FLEXERIL) 10 MG tablet Take 10 mg by mouth every 8 (eight) hours as needed. 08/18/19   [provider]  folic acid (FOLVITE) A999333 MCG tablet Take 1 tablet (400 mcg total) by mouth daily. 11/07/21   Harriett Rush, PA-C  HYDROcodone-acetaminophen (NORCO/VICODIN) 5-325 MG tablet Take 1 tablet by mouth every 6  (six) hours as needed. 08/18/19   [provider]  ondansetron (ZOFRAN) 4 MG tablet Take 4 mg by mouth every 6 (six) hours as needed for nausea or vomiting.  Patient not taking: Reported on 11/07/2021    [provider]  Polyethyl Glycol-Propyl Glycol 0.4-0.3 % SOLN Apply 1 drop to eye as needed.    [provider]  vitamin B-12 (CYANOCOBALAMIN) 500 MCG tablet Take 1 tablet (500 mcg total) by mouth daily. 11/07/21   Harriett Rush, PA-C      Allergies    Flucloxacillin    Review of Systems   Review of Systems  Musculoskeletal:  Positive for arthralgias.  Neurological:  Positive for syncope.  Hematological:  Bruises/bleeds easily.  All other systems reviewed and are negative.  Physical Exam Updated Vital Signs BP 130/74    Pulse 88    Temp 98.5 F (36.9 C) (Oral)    Resp (!) 24    Ht 5\' 10"  (1.778 m)    Wt 58.1 kg    SpO2 97%    BMI 18.37 kg/m  Physical Exam Vitals and nursing note reviewed.  Constitutional:      General: He is not in acute distress.    Appearance: Normal appearance.     Comments: nontoxic  HENT:     Head: Normocephalic and atraumatic.  Mouth/Throat:     Mouth: Mucous membranes are dry.  Eyes:     Conjunctiva/sclera: Conjunctivae normal.     Pupils: Pupils are equal, round, and reactive to light.  Neck:     Comments: Mild tenderness palpation midline C-spine pain or step-offs or deformities. Cardiovascular:     Rate and Rhythm: Normal rate and regular rhythm.     Pulses: Normal pulses.  Pulmonary:     Effort: Pulmonary effort is normal. No respiratory distress.     Breath sounds: Normal breath sounds. No wheezing.     Comments: Speaking in full sentences.  Clear lung sounds in all fields. Abdominal:     General: There is no distension.     Palpations: Abdomen is soft. There is no mass.     Tenderness: There is no abdominal tenderness. There is no guarding or rebound.  Musculoskeletal:        General: Normal range of  motion.     Cervical back: Normal range of motion and neck supple.     Comments: Tenderness palpation of the right side posterior ribs and mid thoracic spine.  No step-offs or deformities.  No tenderness palpation elsewhere the back.  No deformity of upper or lower extremities.  Full active range of motion of shoulders, elbows, knees without difficulty.  Skin:    General: Skin is warm and dry.     Capillary Refill: Capillary refill takes less than 2 seconds.  Neurological:     Mental Status: He is alert and oriented to person, place, and time.  Psychiatric:        Mood and Affect: Mood and affect normal.        Speech: Speech normal.        Behavior: Behavior normal.    ED Results / Procedures / Treatments   Labs (all labs ordered are listed, but only abnormal results are displayed) Labs Reviewed  CBC WITH DIFFERENTIAL/PLATELET - Abnormal; Notable for the following components:      Result Value   RBC 2.85 (*)    Hemoglobin 10.7 (*)    HCT 31.4 (*)    MCV 110.2 (*)    MCH 37.5 (*)    Platelets 76 (*)    All other components within normal limits  COMPREHENSIVE METABOLIC PANEL - Abnormal; Notable for the following components:   Creatinine, Ser 1.47 (*)    Calcium 8.7 (*)    Total Protein 5.7 (*)    Albumin 3.4 (*)    AST 69 (*)    Total Bilirubin 1.3 (*)    GFR, Estimated 49 (*)    All other components within normal limits  PROTIME-INR - Abnormal; Notable for the following components:   Prothrombin Time 16.2 (*)    INR 1.3 (*)    All other components within normal limits  URINALYSIS, ROUTINE W REFLEX MICROSCOPIC - Abnormal; Notable for the following components:   Specific Gravity, Urine 1.004 (*)    Hgb urine dipstick MODERATE (*)    All other components within normal limits  RESP PANEL BY RT-PCR (FLU A&B, COVID) [id number]  TSH  POC OCCULT BLOOD, ED    EKG EKG Interpretation  Date/Time:  Thursday November 21 2021 11:08:52 EST Ventricular Rate:  88 PR Interval:  157 QRS  Duration: 87 QT Interval:  360 QTC Calculation: 436 R Axis:   -64 Text Interpretation: Sinus rhythm Left anterior fascicular block Abnormal R-wave progression, late transition No significant change since last tracing Confirmed by Dorie Rank (  [id number]) on 11/21/2021 11:14:11 AM  Radiology DG Pelvis 1-2 Views  Result Date: 11/21/2021 CLINICAL DATA:  Fall. EXAM: PELVIS - 1-2 VIEW COMPARISON:  CT abdomen pelvis dated September 19, 2019. FINDINGS: There is no evidence of pelvic fracture or diastasis. No pelvic bone lesions are seen. IMPRESSION: Negative. Electronically Signed   By: Titus Dubin M.D.   On: 11/21/2021 13:12   CT Head Wo Contrast  Result Date: 11/21/2021 CLINICAL DATA:  Fall, trauma EXAM: CT HEAD WITHOUT CONTRAST TECHNIQUE: Contiguous axial images were obtained from the base of the skull through the vertex without intravenous contrast. RADIATION DOSE REDUCTION: This exam was performed according to the departmental dose-optimization program which includes automated exposure control, adjustment of the mA and/or kV according to patient size and/or use of iterative reconstruction technique. COMPARISON:  CT head 10/21/2018 FINDINGS: Brain: There is no evidence of acute intracranial hemorrhage, extra-axial fluid collection, or acute infarct. Parenchymal volume is within normal limits. The ventricles are normal in size. Patchy hypodensity in the subcortical and periventricular white matter likely reflects sequela of mild chronic white matter microangiopathy. There is no mass lesion.  There is no midline shift. Vascular: No hyperdense vessel or unexpected calcification. Skull: Postsurgical changes reflecting bifrontal craniotomy are noted. There is no calvarial fracture or suspicious osseous lesion. Sinuses/Orbits: The imaged paranasal sinuses are clear. Bilateral lens implants are in place. The globes and orbits are otherwise unremarkable. Other: None. IMPRESSION: No acute intracranial hemorrhage or  calvarial fracture. Electronically Signed   By: Valetta Mole M.D.   On: 11/21/2021 13:13   CT Chest Wo Contrast  Result Date: 11/21/2021 CLINICAL DATA:  Fall, tenderness to palpation in midline T-spine and posterior right ribs EXAM: CT CHEST WITHOUT CONTRAST TECHNIQUE: Multidetector CT imaging of the chest was performed following the standard protocol without IV contrast. RADIATION DOSE REDUCTION: This exam was performed according to the departmental dose-optimization program which includes automated exposure control, adjustment of the mA and/or kV according to patient size and/or use of iterative reconstruction technique. COMPARISON:  CT chest 08/09/2021 FINDINGS: Cardiovascular: The heart is not enlarged. There is no pericardial effusion. There is minimal calcification of the thoracic aorta. The vasculature is otherwise unremarkable, as can be evaluated in the absence of intravenous contrast. Mediastinum/Nodes: The thyroid is unremarkable. The esophagus is diffusely patulous, unchanged. There is no mediastinal or axillary lymphadenopathy. There is no bulky hilar adenopathy. Lungs/Pleura: The trachea and central airways are patent. The lungs are well inflated. Linear opacities in the right base likely reflect atelectasis and/or scar. There is no focal consolidation. There is no pulmonary edema. There is no pleural effusion or pneumothorax. There is no evidence of traumatic parenchymal injury. There are no new or enlarging pulmonary nodules. Upper Abdomen: A calcification in the right hepatic lobe is unchanged. Cholecystectomy clips are again noted. There is a left upper pole renal cyst. There is moderate left hydronephrosis, incompletely imaged, similar in appearance to the partially imaged kidney from the CTA chest from 09/30/2020. Musculoskeletal: No rib fracture is seen. There is no evidence of thoracic spine fracture. There is no suspicious osseous lesion. IMPRESSION: 1. No evidence of acute traumatic  injury in the chest. 2. No focal airspace disease or pleural effusion. Electronically Signed   By: Valetta Mole M.D.   On: 11/21/2021 13:05   CT Cervical Spine Wo Contrast  Result Date: 11/21/2021 CLINICAL DATA:  Trauma EXAM: CT CERVICAL SPINE WITHOUT CONTRAST TECHNIQUE: Multidetector CT imaging of the cervical spine was performed without intravenous  contrast. Multiplanar CT image reconstructions were also generated. RADIATION DOSE REDUCTION: This exam was performed according to the departmental dose-optimization program which includes automated exposure control, adjustment of the mA and/or kV according to patient size and/or use of iterative reconstruction technique. COMPARISON:  None. FINDINGS: Alignment: There is grade 1 retrolisthesis of C3 on C4. There is no jumped or perched facets or other evidence of traumatic malalignment. Skull base and vertebrae: Skull base alignment is maintained. Vertebral body heights are preserved. There is no evidence of acute fracture. Soft tissues and spinal canal: No prevertebral fluid or swelling. No visible canal hematoma. Disc levels: There is mild multilevel disc space narrowing, degenerative endplate change, and facet arthropathy. There is no high-grade spinal canal stenosis. There is severe left neural foraminal stenosis at C3-C4 and C4-C5, and moderate left neural foraminal stenosis at C5-C6. Upper chest: The lungs are assessed on the separately dictated CT chest. Other: None. IMPRESSION: 1. No acute fracture or traumatic malalignment of the cervical spine. 2. Multilevel degenerative changes as above. Electronically Signed   By: Valetta Mole M.D.   On: 11/21/2021 13:09    Procedures Procedures    Medications Ordered in ED Medications  HYDROcodone-acetaminophen (NORCO/VICODIN) 5-325 MG per tablet 1 tablet (has no administration in time range)  folic acid (FOLVITE) tablet 0.5 mg (has no administration in time range)  vitamin B-12 (CYANOCOBALAMIN) tablet 500 mcg  (has no administration in time range)  cyclobenzaprine (FLEXERIL) tablet 10 mg (has no administration in time range)  sodium chloride flush (NS) 0.9 % injection 3 mL (has no administration in time range)  0.9 %  sodium chloride infusion (has no administration in time range)  acetaminophen (TYLENOL) tablet 650 mg (has no administration in time range)    Or  acetaminophen (TYLENOL) suppository 650 mg (has no administration in time range)  ondansetron (ZOFRAN) tablet 4 mg (has no administration in time range)    Or  ondansetron (ZOFRAN) injection 4 mg (has no administration in time range)  senna-docusate (Senokot-S) tablet 1 tablet (has no administration in time range)  apixaban (ELIQUIS) tablet 5 mg (has no administration in time range)    ED Course/ Medical Decision Making/ A&P                           Medical Decision Making Amount and/or Complexity of Data Reviewed Labs: ordered. Radiology: ordered.  Risk Decision regarding hospitalization.    This patient presents to the ED for concern of syncope. This involves a number of treatment options, and is a complaint that carries with it a high risk of complications and morbidity.  The differential diagnosis includes Subdural bleed, anemia, dehydration, electrolyte abnormality, infection, arrhythmia   Co morbidities: PE on anticoagulation, history of subdural bleed, thrombocytopenia   Additional history: Reviewed recent hemeonc notes   Lab Tests:  I ordered, and personally interpreted labs.  The pertinent results include: Anemia of 10, significant in that patient was 13 3 weeks ago.  Will do rectal exam and add on Hemoccult. Chronic thrombocytopenia. UA negative for infection.   Hemoccult negative. No gross blood on rectal exam   Imaging Studies:  I ordered imaging studies including ct head, ct neck, ct chest, dg pelvis.  I independently visualized and interpreted imaging which showed no bleed, fx, dislocation.  I agree  with the radiologist interpretation   Cardiac Monitoring:  The patient was maintained on a cardiac monitor.  I personally viewed and interpreted the  cardiac monitored which showed an underlying rhythm of: nsr   Disposition:  After consideration of the diagnostic results and the patients response to treatment, I feel that the patent would benefit from admission for syncope and trending of hemoglobin.  Discussed findings and plan with patient and son.  Discussed recommendations for admission for syncope work-up, especially in the setting of hemoglobin drop.  Patient and son are agreeable.  Discussed with Dr. Dwyane Dee from [facility], patient to be admitted   Final Clinical Impression(s) / ED Diagnoses Final diagnoses:  Syncope, unspecified syncope type  Anemia, unspecified type    Rx / DC Orders ED Discharge Orders     None         Franchot Heidelberg, PA-C 11/21/21 [id number]    Dorie Rank, MD 11/22/21 [phone]

## 2021-11-21 NOTE — Progress Notes (Signed)
*  PRELIMINARY RESULTS* Echocardiogram 2D Echocardiogram has been performed with Definity.  Stacey Drain 11/21/2021, 4:20 PM

## 2021-11-21 NOTE — Plan of Care (Signed)
Pt brought up from the ED via w/c. Pt alert and oriented x 4. No pain at this time.

## 2021-11-21 NOTE — ED Triage Notes (Signed)
Pt says is on eloquis and passed out in the shower last night.   Says unsure if he hit his head when he passed out.  C/O feeling sore all over.  C/O generalized weakness.

## 2021-11-21 NOTE — Plan of Care (Signed)

## 2021-11-21 NOTE — ED Notes (Signed)
US at bedside

## 2021-11-22 ENCOUNTER — Observation Stay (HOSPITAL_COMMUNITY): Payer: Medicare Other

## 2021-11-22 DIAGNOSIS — Z87442 Personal history of urinary calculi: Secondary | ICD-10-CM | POA: Diagnosis not present

## 2021-11-22 DIAGNOSIS — E43 Unspecified severe protein-calorie malnutrition: Secondary | ICD-10-CM | POA: Diagnosis present

## 2021-11-22 DIAGNOSIS — Z681 Body mass index (BMI) 19 or less, adult: Secondary | ICD-10-CM | POA: Diagnosis not present

## 2021-11-22 DIAGNOSIS — D696 Thrombocytopenia, unspecified: Secondary | ICD-10-CM | POA: Diagnosis present

## 2021-11-22 DIAGNOSIS — Z20822 Contact with and (suspected) exposure to covid-19: Secondary | ICD-10-CM | POA: Diagnosis present

## 2021-11-22 DIAGNOSIS — N182 Chronic kidney disease, stage 2 (mild): Secondary | ICD-10-CM | POA: Diagnosis present

## 2021-11-22 DIAGNOSIS — Z833 Family history of diabetes mellitus: Secondary | ICD-10-CM | POA: Diagnosis not present

## 2021-11-22 DIAGNOSIS — Z79899 Other long term (current) drug therapy: Secondary | ICD-10-CM | POA: Diagnosis not present

## 2021-11-22 DIAGNOSIS — R7401 Elevation of levels of liver transaminase levels: Secondary | ICD-10-CM | POA: Diagnosis present

## 2021-11-22 DIAGNOSIS — R55 Syncope and collapse: Secondary | ICD-10-CM | POA: Diagnosis present

## 2021-11-22 DIAGNOSIS — N179 Acute kidney failure, unspecified: Secondary | ICD-10-CM | POA: Diagnosis present

## 2021-11-22 DIAGNOSIS — E538 Deficiency of other specified B group vitamins: Secondary | ICD-10-CM | POA: Diagnosis present

## 2021-11-22 DIAGNOSIS — Z86711 Personal history of pulmonary embolism: Secondary | ICD-10-CM | POA: Diagnosis not present

## 2021-11-22 DIAGNOSIS — Z800 Family history of malignant neoplasm of digestive organs: Secondary | ICD-10-CM | POA: Diagnosis not present

## 2021-11-22 DIAGNOSIS — Z880 Allergy status to penicillin: Secondary | ICD-10-CM | POA: Diagnosis not present

## 2021-11-22 DIAGNOSIS — Z7901 Long term (current) use of anticoagulants: Secondary | ICD-10-CM | POA: Diagnosis not present

## 2021-11-22 DIAGNOSIS — F17210 Nicotine dependence, cigarettes, uncomplicated: Secondary | ICD-10-CM | POA: Diagnosis present

## 2021-11-22 LAB — HEPATITIS PANEL, ACUTE
HCV Ab: NONREACTIVE
Hep A IgM: NONREACTIVE
Hep B C IgM: NONREACTIVE
Hepatitis B Surface Ag: NONREACTIVE

## 2021-11-22 LAB — COMPREHENSIVE METABOLIC PANEL
ALT: 147 U/L — ABNORMAL HIGH (ref 0–44)
AST: 379 U/L — ABNORMAL HIGH (ref 15–41)
Albumin: 3.3 g/dL — ABNORMAL LOW (ref 3.5–5.0)
Alkaline Phosphatase: 205 U/L — ABNORMAL HIGH (ref 38–126)
Anion gap: 8 (ref 5–15)
BUN: 17 mg/dL (ref 8–23)
CO2: 27 mmol/L (ref 22–32)
Calcium: 9.1 mg/dL (ref 8.9–10.3)
Chloride: 106 mmol/L (ref 98–111)
Creatinine, Ser: 1.25 mg/dL — ABNORMAL HIGH (ref 0.61–1.24)
GFR, Estimated: >60 mL/min (ref >60)
Glucose, Bld: 86 mg/dL (ref 70–99)
Potassium: 3.6 mmol/L (ref 3.5–5.1)
Sodium: 141 mmol/L (ref 135–145)
Total Bilirubin: 5 mg/dL — ABNORMAL HIGH (ref 0.3–1.2)
Total Protein: 6 g/dL — ABNORMAL LOW (ref 6.5–8.1)

## 2021-11-22 LAB — CBC
HCT: 34.8 % — ABNORMAL LOW (ref 39.0–52.0)
Hemoglobin: 11.5 g/dL — ABNORMAL LOW (ref 13.0–17.0)
MCH: 36.9 pg — ABNORMAL HIGH (ref 26.0–34.0)
MCHC: 33 g/dL (ref 30.0–36.0)
MCV: 111.5 fL — ABNORMAL HIGH (ref 80.0–100.0)
Platelets: 80 10*3/uL — ABNORMAL LOW (ref 150–400)
RBC: 3.12 MIL/uL — ABNORMAL LOW (ref 4.22–5.81)
RDW: 15.1 % (ref 11.5–15.5)
WBC: 4.2 10*3/uL (ref 4.0–10.5)
nRBC: 0 % (ref 0.0–0.2)

## 2021-11-22 LAB — MAGNESIUM: Magnesium: 2.2 mg/dL (ref 1.7–2.4)

## 2021-11-22 LAB — PHOSPHORUS: Phosphorus: 2.7 mg/dL (ref 2.5–4.6)

## 2021-11-22 NOTE — Progress Notes (Addendum)
Patient is alert and oriented. Patient complained of back pain at start of shift. He was medicated per request and he rested throughout the night. Patient stated Dr. Margo Aye told him to take flexeril and Vicodin at night together. Son confirmed.

## 2021-11-22 NOTE — Evaluation (Signed)
Physical Therapy Evaluation Patient Details Name: Brian Stark MRN: [id number] DOB: 1946-06-10 Today's Date: 11/22/2021  History of Present Illness  Brian Stark is a 76 y.o. male with PMH significant for history of blood clots, muscle spasm,  vitamin B12 and folate deficiency, thrombocytopenia presented in the ED s/p syncopal episode after taking shower.  Patient reports that he was in his usual state of health in the morning and then he went to take shower,  immediately after he finished shower, he passed out.  He denies any prodromal symptoms ,denies any dizziness ,  head injury,  stated he was in the tub for some time,  does not remember the time,  His son was able to get him out of the tub,  he was able to walk towards the bed.  Patient has been ambulating well, denies any headache, dizziness, nausea, vomiting, palpitations.  Patient does report having similar episodes in the past and found to have subdural hematoma.  Patient has been having problem with platelet count.   Clinical Impression  Patient seated EOB at beginning of session. Patient able to don shoes independently and demonstrates good sitting balance. He transfers to standing with SPC and is unsteady upon initial standing reaching for nearby objects for support. Patient ambulates with SPC with slightly unsteady cadence that improves with increased time. Patient eager to return home. Educated patient and son on possible benefit from attending outpatient PT to improve gait, balance, and strength but patient not wishing to pursue at this time due to other health appointments. Patient overall functioning near baseline. Patient discharged to care of nursing for ambulation daily as tolerated for length of stay.      Recommendations for follow up therapy are one component of a multi-disciplinary discharge planning process, led by the attending physician.  Recommendations may be updated based on patient status, additional functional  criteria and insurance authorization.  Follow Up Recommendations No PT follow up    Assistance Recommended at Discharge Intermittent Supervision/Assistance  Patient can return home with the following  A little help with walking and/or transfers;Assistance with cooking/housework;Help with stairs or ramp for entrance    Equipment Recommendations None recommended by PT  Recommendations for Other Services       Functional Status Assessment Patient has had a recent decline in their functional status and demonstrates the ability to make significant improvements in function in a reasonable and predictable amount of time.     Precautions / Restrictions Precautions Precautions: Fall Precaution Comments: recent syncope episode at home. Restrictions Weight Bearing Restrictions: No      Mobility  Bed Mobility               General bed mobility comments: seated EOB at beginning of session    Transfers Overall transfer level: Needs assistance Equipment used: Straight cane Transfers: Sit to/from Stand Sit to Stand: Min guard           General transfer comment: assist due to unsteadiness upon standing    Ambulation/Gait Ambulation/Gait assistance: Min guard Gait Distance (Feet): 200 Feet Assistive device: Straight cane Gait Pattern/deviations: Step-through pattern, Trunk flexed Gait velocity: decreased     General Gait Details: slightly unsteady cadence with SPC, gait improves with further ambulation  Stairs            Wheelchair Mobility    Modified Rankin (Stroke Patients Only)       Balance Overall balance assessment: Needs assistance Sitting-balance support: No upper extremity supported, Feet supported  Sitting balance-Leahy Scale: Good Sitting balance - Comments: seated EOB   Standing balance support: Single extremity supported Standing balance-Leahy Scale: Fair Standing balance comment: with SPC                              Pertinent Vitals/Pain Pain Assessment Pain Assessment: Faces Faces Pain Scale: Hurts little more Pain Location: entire length of spine Pain Descriptors / Indicators: Aching, Constant Pain Intervention(s): Limited activity within patient's tolerance, Monitored during session, Premedicated before session    [place] expects to be discharged to:: Private residence Living Arrangements: Alone Available Help at Discharge: Family;Available PRN/intermittently Type of Home: House (townhouse) Home Access: Stairs to enter Entrance Stairs-Rails: None Entrance Stairs-Number of Steps: 1 Alternate Level Stairs-Number of Steps: 15 Home Layout: Two level Home Equipment: Cane - single point;Shower seat;Rollator (4 wheels)      Prior Function Prior Level of Function : Independent/Modified Independent;History of Falls (last six months)                     Hand Dominance   Dominant Hand: Right    Extremity/Trunk Assessment   Upper Extremity Assessment Upper Extremity Assessment: Defer to OT evaluation    Lower Extremity Assessment Lower Extremity Assessment: Generalized weakness       Communication   Communication: No difficulties  Cognition Arousal/Alertness: Awake/alert Behavior During Therapy: WFL for tasks assessed/performed Overall Cognitive Status: Within Functional Limits for tasks assessed                                          General Comments General comments (skin integrity, edema, etc.): during seated activity while seated on EOB.    Exercises     Assessment/Plan    PT Assessment Patient does not need any further PT services  PT Problem List         PT Treatment Interventions      PT Goals (Current goals can be found in the Care Plan section)  Acute Rehab PT Goals Patient Stated Goal: return home PT Goal Formulation: With patient Time For Goal Achievement: 11/22/21 Potential to Achieve Goals: Good     Frequency       Co-evaluation               AM-PAC PT "6 Clicks" Mobility  Outcome Measure Help needed turning from your back to your side while in a flat bed without using bedrails?: None Help needed moving from lying on your back to sitting on the side of a flat bed without using bedrails?: None Help needed moving to and from a bed to a chair (including a wheelchair)?: None Help needed standing up from a chair using your arms (e.g., wheelchair or bedside chair)?: None Help needed to walk in hospital room?: A Little Help needed climbing 3-5 steps with a railing? : A Little 6 Click Score: 22    End of Session Equipment Utilized During Treatment: Gait belt Activity Tolerance: Patient tolerated treatment well Patient left: in bed;with family/visitor present;with call bell/phone within reach Nurse Communication: Mobility status PT Visit Diagnosis: Unsteadiness on feet (R26.81);Other abnormalities of gait and mobility (R26.89);History of falling (Z91.81)    Time: [id number] PT Time Calculation (min) (ACUTE ONLY): 14 min   Charges:   PT Evaluation $PT Eval Low Complexity: 1 Low  2:39 PM, 11/22/21 Mearl Latin PT, DPT Physical Therapist at [facility]

## 2021-11-22 NOTE — Progress Notes (Signed)
Initial Nutrition Assessment  DOCUMENTATION CODES:   Severe malnutrition in context of chronic illness  INTERVENTION:  Ensure Enlive po BID   Soft diet  Multivitamin daily   NUTRITION DIAGNOSIS:   Severe Malnutrition related to poor appetite, early satiety, chronic illness as evidenced by per patient/family report, energy intake < or equal to 75% for > or equal to 1 month, moderate muscle depletion, severe muscle depletion, moderate fat depletion, severe fat depletion (BMI 18.6).   GOAL:  Patient will meet greater than or equal to 90% of their needs   MONITOR:  PO intake, Supplement acceptance, Labs, Skin, Weight trends  REASON FOR ASSESSMENT:   Malnutrition Screening Tool    ASSESSMENT: Patient is from home. Lives alone. A 76 yo male with hx of kidney stones, Migraines, abdominal surgery,and craniotomy. B-12 and folate deficiency. Presents to ED complaint of syncope   Patient ate only 10% of breakfast and 25% of lunch. He is drinking a Glucerna during RD visit. At home he eats only when he feels hungry. Patient likes tomato sandwiches, soup, bologna and cheese. Refers to himself as a "simple eater". OT has assessed.   Encouraged patient to consider eating by the clock since he either doesn't often feel hungry or he fill up quickly. Strongly recommend ONS at least BID daily -going forward for at least 1 month.  Patient weight has ranged 58-62 kg the past 7 months. Last January wt 64.6 kg. Currently -58.8 kg. A loss of 9% this year. Underweight BMI. PT note reviewed.   Medications reviewed and include: B-12, Folic acid.   IVF- NS @ 50 ml/hr   Labs: BMP Latest Ref Rng & Units 11/22/2021 11/21/2021 03/20/2021  Glucose 70 - 99 mg/dL 86 82 90  BUN 8 - 23 mg/dL 17 15 12   Creatinine 0.61 - 1.24 mg/dL ) 7.84(O) 9.62(X)  Sodium 135 - 145 mmol/L 141 138 136  Potassium 3.5 - 5.1 mmol/L 3.6 4.3 3.6  Chloride 98 - 111 mmol/L 106 106 103  CO2 22 - 32 mmol/L 27 24 27   Calcium  8.9 - 10.3 mg/dL 9.1 5.28(U) 8.9      NUTRITION - FOCUSED PHYSICAL EXAM:  Flowsheet Row Most Recent Value  Orbital Region Moderate depletion  Upper Arm Region Severe depletion  Thoracic and Lumbar Region Moderate depletion  Buccal Region Moderate depletion  Temple Region Moderate depletion  Clavicle Bone Region Moderate depletion  Clavicle and Acromion Bone Region Severe depletion  Scapular Bone Region Severe depletion  Dorsal Hand Severe depletion  Patellar Region Unable to assess  Anterior Thigh Region Unable to assess  Posterior Calf Region Unable to assess  Edema (RD Assessment) None  Hair Reviewed  Eyes Reviewed  Mouth Reviewed  Skin Reviewed  Nails Reviewed       Diet Order:   Diet Order             DIET SOFT Room service appropriate? Yes; Fluid consistency: Thin  Diet effective now                   EDUCATION NEEDS:  Education needs have been addressed  Skin:  Skin Assessment: Reviewed RN Assessment  Last BM:  11/18  Height:   Ht Readings from Last 1 Encounters:  11/21/21 5\' 10"  (1.778 m)    Weight:   Wt Readings from Last 1 Encounters:  11/22/21 58.8 kg    Ideal Body Weight:   75 kg  BMI:  Body mass index is 18.6 kg/m.  Estimated Nutritional Needs:   Kcal:  2065-2234  Protein:  83-89 gr  Fluid:  >1700 ml daily  Royann Shivers MS,RD,CSG,LDN Contact: Loretha Stapler

## 2021-11-22 NOTE — Evaluation (Signed)
Occupational Therapy Evaluation Patient Details Name: Brian Stark MRN: [id number] DOB: December 06, 1945 Today's Date: 11/22/2021   History of Present Illness This 76 years old male with PMH significant for history of PE, muscle spasm, vitamin B12 and folate deficiency, thrombocytopenia presented to the ED s/p syncopal episode after taking shower.   Clinical Impression   Patient is awake in bed upon therapy arrival with Son visiting. Patient is agreeable to OT evaluation although due to present back pain is requesting that he not get out of bed. Patient is currently functioning at baseline of modified independence. He moves at a slower pace due to new onset of back pain. X-ray is negative. Due not see any follow OT needs upon discharging back home. Thank you for the referral.       Recommendations for follow up therapy are one component of a multi-disciplinary discharge planning process, led by the attending physician.  Recommendations may be updated based on patient status, additional functional criteria and insurance authorization.   Follow Up Recommendations  No OT follow up    Assistance Recommended at Discharge PRN  Patient can return home with the following Other (comment) (PRN assist from family with ADL tasks.)    Functional Status Assessment  Patient has had a recent decline in their functional status and demonstrates the ability to make significant improvements in function in a reasonable and predictable amount of time.  Equipment Recommendations  None recommended by OT    Recommendations for Other Services       Precautions / Restrictions Precautions Precautions: Fall Precaution Comments: recent syncope episode at home. Restrictions Weight Bearing Restrictions: No      Mobility Bed Mobility Overal bed mobility: Modified Independent        Transfers       General transfer comment: did not transfer out of bed due to pain level. Patient request.      Balance  Overall balance assessment: No apparent balance deficits (not formally assessed)             ADL either performed or assessed with clinical judgement   ADL Overall ADL's : At baseline;Modified independent             Vision Baseline Vision/History: 0 No visual deficits              Pertinent Vitals/Pain Pain Assessment Pain Assessment: 0-10 Pain Score: 5  (with activity 7/10) Pain Location: entire length of spine Pain Descriptors / Indicators: Aching, Constant Pain Intervention(s): Limited activity within patient's tolerance, Monitored during session, Premedicated before session     Hand Dominance Right   Extremity/Trunk Assessment Upper Extremity Assessment Upper Extremity Assessment: Overall WFL for tasks assessed   Lower Extremity Assessment Lower Extremity Assessment: Defer to PT evaluation       Communication Communication Communication: No difficulties   Cognition Arousal/Alertness: Awake/alert Behavior During Therapy: WFL for tasks assessed/performed Overall Cognitive Status: Within Functional Limits for tasks assessed         General Comments  during seated activity while seated on EOB.            Home Living Family/patient expects to be discharged to:: Private residence Living Arrangements: Alone Available Help at Discharge: Family;Available PRN/intermittently Type of Home: House (townhouse) Home Access: Stairs to enter [employer] of Steps: 1 Entrance Stairs-Rails: None Home Layout: Two level Alternate Level Stairs-Number of Steps: 15 Alternate Level Stairs-Rails: Right;Left (full length rail on the right then a small length railing on the left near  the top of the stairs) Bathroom Shower/Tub: Chief Strategy Officer: Standard Bathroom Accessibility: Yes   Home Equipment: Cane - single point;Shower seat;Rollator (4 wheels)          Prior Functioning/Environment Prior Level of Function : Independent/Modified  Independent;History of Falls (last six months)              OT Problem List: Pain      OT Treatment/Interventions:      OT Goals(Current goals can be found in the care plan section) Acute Rehab OT Goals Patient Stated Goal: to go home with decreased pain and no more passing out.  OT Frequency:  One visit only       AM-PAC OT "6 Clicks" Daily Activity     Outcome Measure Help from another person eating meals?: None Help from another person taking care of personal grooming?: None Help from another person toileting, which includes using toliet, bedpan, or urinal?: None Help from another person bathing (including washing, rinsing, drying)?: None Help from another person to put on and taking off regular upper body clothing?: None Help from another person to put on and taking off regular lower body clothing?: None 6 Click Score: 24   End of Session    Activity Tolerance: Patient tolerated treatment well;Patient limited by pain Patient left: in bed;with call bell/phone within reach;with family/visitor present  OT Visit Diagnosis: Muscle weakness (generalized) (M62.81);History of falling (Z91.81)                Time: 2694-8546 OT Time Calculation (min): 11 min Charges:  OT General Charges $OT Visit: 1 Visit OT Evaluation $OT Eval Low Complexity: 1 Low  Limmie Patricia, OTR/L,CBIS  [phone]   Lizbeth Feijoo, Charisse March 11/22/2021, 12:54 PM

## 2021-11-22 NOTE — Progress Notes (Addendum)
PROGRESS NOTE    Brian Stark  [id number] DOB: January 15, 1946 DOA: 11/21/2021 PCP: Celene Squibb, MD   Brief Narrative: This 76 years old male with PMH significant for history of PE, muscle spasm, vitamin B12 and folate deficiency, thrombocytopenia presented to the ED s/p syncopal episode after taking shower.  Patient denies any prodromal symptoms before syncope, patient remained in the tub for some time,  does not remember the duration, was not able to get up, his son has found him in the tub and has get him out of the tub,  he was able to walk towards the back.  Patient has been ambulating well, denies any headache, dizziness, nausea vomiting or palpitations. Patient is admitted for syncope work-up.  CT head and skeletal survey unremarkable.  Repeat labs showed elevated AST,  ALT and total bilirubin.  Abdominal ultrasound showed no significant changes.  Assessment & Plan:   Principal Problem:   Syncope and collapse Active Problems:   PE (pulmonary thromboembolism) (HCC)   Thrombocytopenia (HCC)   Vitamin B 12 deficiency  Syncope: Patient briefly passed out after taking shower. Patient denies any prodromal symptoms before passing out. Patient reports feeling generalized weakness otherwise denies any other symptoms. Admitted for observation. Continue telemetry monitoring 2D echo: LVEF 60 to 65%, no regional wall motion abnormalities. Carotid duplex: Right ICA: Less than 50% stenosis, left ICA WNL CT head and other imaging unremarkable. PT and OT evaluation.  Continue gentle IV hydration.  AKI on CKD II:  Baseline serum creatinine remains between 1.1-1.2. Presented with serum creatinine 1.47 Continue IV hydration, serum creatinine improving.  Elevated liver enzymes: There is significant uptrend in liver enzymes. Total bilirubin elevated, ultrasound: Nephrolithiasis. Obtain hepatitis panel.  Continue to monitor liver enzyme   History of pulmonary embolism: Continue Eliquis    Thrombocytopenia: No signs of any bleeding, continue to monitor.   Vitamin B12 deficiency: Continue vitamin B12 supplementation. Continue folic acid     DVT prophylaxis: Eliquis Code Status: Full code Family Communication: Son at bedside Disposition Plan:   Status is: Observation  The patient remains OBS appropriate and will d/c before 2 midnights.  Admitted for syncope work-up, now found to have elevated liver enzymes and total bilirubin.  Requiring abdominal ultrasound and further work-up.     Consultants:   None  Procedures: CT head, CT C-spine, ultrasound abdomen Antimicrobials: None  Subjective: Patient was seen and examined at bedside.  Overnight events noted.   Patient reports feeling improved.  Denies any abdominal pain, nausea, vomiting, diarrhea.  Objective: Vitals:   11/21/21 2108 11/22/21 0044 11/22/21 0602 11/22/21 0629  BP: 133/74 120/74 123/81   Pulse: 89 86 78   Resp: 19 19 19    Temp: 98.2 F (36.8 C) (!) 97 F (36.1 C) 98 F (36.7 C)   TempSrc: Oral  Oral   SpO2: 96% 96% 97%   Weight:    58.8 kg  Height:        Intake/Output Summary (Last 24 hours) at 11/22/2021 1124 Last data filed at 11/22/2021 0941 Gross per 24 hour  Intake 483 ml  Output 300 ml  Net 183 ml   Filed Weights   11/21/21 1053 11/22/21 0629  Weight: 58.1 kg 58.8 kg    Examination:  General exam: Appears comfortable, not in any acute distress. Respiratory system: Clear to auscultation bilaterally, respiratory effort normal, RR 15 Cardiovascular system: S1 & S2 heard, regular rate and rhythm, no murmur Gastrointestinal system: Abdomen is soft, nontender, nondistended, BS+  Central nervous system: Alert and oriented X3. No focal neurological deficits. Extremities: No edema, no cyanosis, no clubbing. Skin: No rashes, lesions or ulcers Psychiatry: Judgement and insight appear normal. Mood & affect appropriate.     Data Reviewed: I have personally reviewed following  labs and imaging studies  CBC: Recent Labs  Lab 11/21/21 1142 11/22/21 0423  WBC 6.1 4.2  NEUTROABS 4.7  --   HGB 10.7* 11.5*  HCT 31.4* 34.8*  MCV 110.2* 111.5*  PLT 76* 80*   Basic Metabolic Panel: Recent Labs  Lab 11/21/21 1142 11/22/21 0423  NA 138 141  K 4.3 3.6  CL 106 106  CO2 24 27  GLUCOSE 82 86  BUN 15 17  CREATININE 1.47* 1.25*  CALCIUM 8.7* 9.1  MG  --  2.2  PHOS  --  2.7   GFR: Estimated Creatinine Clearance: 42.5 mL/min (A) (by C-G formula based on SCr of 1.25 mg/dL (H)). Liver Function Tests: Recent Labs  Lab 11/21/21 1142 11/22/21 0423  AST 69* 379*  ALT 36 147*  ALKPHOS 114 205*  BILITOT 1.3* 5.0*  PROT 5.7* 6.0*  ALBUMIN 3.4* 3.3*   No results for input(s): LIPASE, AMYLASE in the last 168 hours. No results for input(s): AMMONIA in the last 168 hours. Coagulation Profile: Recent Labs  Lab 11/21/21 1142  INR 1.3*   Cardiac Enzymes: No results for input(s): CKTOTAL, CKMB, CKMBINDEX, TROPONINI in the last 168 hours. BNP (last 3 results) No results for input(s): PROBNP in the last 8760 hours. HbA1C: No results for input(s): HGBA1C in the last 72 hours. CBG: No results for input(s): GLUCAP in the last 168 hours. Lipid Profile: No results for input(s): CHOL, HDL, LDLCALC, TRIG, CHOLHDL, LDLDIRECT in the last 72 hours. Thyroid Function Tests: Recent Labs    11/21/21 1143  TSH 1.068   Anemia Panel: No results for input(s): VITAMINB12, FOLATE, FERRITIN, TIBC, IRON, RETICCTPCT in the last 72 hours. Sepsis Labs: No results for input(s): PROCALCITON, LATICACIDVEN in the last 168 hours.  Recent Results (from the past 240 hour(s))  Resp Panel by RT-PCR (Flu A&B, Covid) Nasopharyngeal Swab     Status: None   Collection Time: 11/21/21 11:47 AM   Specimen: Nasopharyngeal Swab; Nasopharyngeal(NP) swabs in vial transport medium  Result Value Ref Range Status   SARS Coronavirus 2 by RT PCR NEGATIVE NEGATIVE Final    Comment:  (NOTE) SARS-CoV-2 target nucleic acids are NOT DETECTED.  The SARS-CoV-2 RNA is generally detectable in upper respiratory specimens during the acute phase of infection. The lowest concentration of SARS-CoV-2 viral copies this assay can detect is 138 copies/mL. A negative result does not preclude SARS-Cov-2 infection and should not be used as the sole basis for treatment or other patient management decisions. A negative result may occur with  improper specimen collection/handling, submission of specimen other than nasopharyngeal swab, presence of viral mutation(s) within the areas targeted by this assay, and inadequate number of viral copies(<138 copies/mL). A negative result must be combined with clinical observations, patient history, and epidemiological information. The expected result is Negative.  Fact Sheet for Patients:  EntrepreneurPulse.com.au  Fact Sheet for Healthcare Providers:  IncredibleEmployment.be  This test is no t yet approved or cleared by the [place] FDA and  has been authorized for detection and/or diagnosis of SARS-CoV-2 by FDA under an Emergency Use Authorization (EUA). This EUA will remain  in effect (meaning this test can be used) for the duration of the COVID-19 declaration under Section 564(b)(1) of  the Act, 21 U.S.C.section 360bbb-3(b)(1), unless the authorization is terminated  or revoked sooner.       Influenza A by PCR NEGATIVE NEGATIVE Final   Influenza B by PCR NEGATIVE NEGATIVE Final    Comment: (NOTE) The Xpert Xpress SARS-CoV-2/FLU/RSV plus assay is intended as an aid in the diagnosis of influenza from Nasopharyngeal swab specimens and should not be used as a sole basis for treatment. Nasal washings and aspirates are unacceptable for Xpert Xpress SARS-CoV-2/FLU/RSV testing.  Fact Sheet for Patients: EntrepreneurPulse.com.au  Fact Sheet for Healthcare  Providers: IncredibleEmployment.be  This test is not yet approved or cleared by the [place] FDA and has been authorized for detection and/or diagnosis of SARS-CoV-2 by FDA under an Emergency Use Authorization (EUA). This EUA will remain in effect (meaning this test can be used) for the duration of the COVID-19 declaration under Section 564(b)(1) of the Act, 21 U.S.C. section 360bbb-3(b)(1), unless the authorization is terminated or revoked.  Performed at [facility], [address], [place], [place] [address]     Radiology Studies: DG Pelvis 1-2 Views  Result Date: 11/21/2021 CLINICAL DATA:  Fall. EXAM: PELVIS - 1-2 VIEW COMPARISON:  CT abdomen pelvis dated September 19, 2019. FINDINGS: There is no evidence of pelvic fracture or diastasis. No pelvic bone lesions are seen. IMPRESSION: Negative. Electronically Signed   By: Titus Dubin M.D.   On: 11/21/2021 13:12   CT Head Wo Contrast  Result Date: 11/21/2021 CLINICAL DATA:  Fall, trauma EXAM: CT HEAD WITHOUT CONTRAST TECHNIQUE: Contiguous axial images were obtained from the base of the skull through the vertex without intravenous contrast. RADIATION DOSE REDUCTION: This exam was performed according to the departmental dose-optimization program which includes automated exposure control, adjustment of the mA and/or kV according to patient size and/or use of iterative reconstruction technique. COMPARISON:  CT head 10/21/2018 FINDINGS: Brain: There is no evidence of acute intracranial hemorrhage, extra-axial fluid collection, or acute infarct. Parenchymal volume is within normal limits. The ventricles are normal in size. Patchy hypodensity in the subcortical and periventricular white matter likely reflects sequela of mild chronic white matter microangiopathy. There is no mass lesion.  There is no midline shift. Vascular: No hyperdense vessel or unexpected calcification. Skull: Postsurgical changes reflecting bifrontal  craniotomy are noted. There is no calvarial fracture or suspicious osseous lesion. Sinuses/Orbits: The imaged paranasal sinuses are clear. Bilateral lens implants are in place. The globes and orbits are otherwise unremarkable. Other: None. IMPRESSION: No acute intracranial hemorrhage or calvarial fracture. Electronically Signed   By: Valetta Mole M.D.   On: 11/21/2021 13:13   CT Chest Wo Contrast  Result Date: 11/21/2021 CLINICAL DATA:  Fall, tenderness to palpation in midline T-spine and posterior right ribs EXAM: CT CHEST WITHOUT CONTRAST TECHNIQUE: Multidetector CT imaging of the chest was performed following the standard protocol without IV contrast. RADIATION DOSE REDUCTION: This exam was performed according to the departmental dose-optimization program which includes automated exposure control, adjustment of the mA and/or kV according to patient size and/or use of iterative reconstruction technique. COMPARISON:  CT chest 08/09/2021 FINDINGS: Cardiovascular: The heart is not enlarged. There is no pericardial effusion. There is minimal calcification of the thoracic aorta. The vasculature is otherwise unremarkable, as can be evaluated in the absence of intravenous contrast. Mediastinum/Nodes: The thyroid is unremarkable. The esophagus is diffusely patulous, unchanged. There is no mediastinal or axillary lymphadenopathy. There is no bulky hilar adenopathy. Lungs/Pleura: The trachea and central airways are patent. The lungs are well inflated.  Linear opacities in the right base likely reflect atelectasis and/or scar. There is no focal consolidation. There is no pulmonary edema. There is no pleural effusion or pneumothorax. There is no evidence of traumatic parenchymal injury. There are no new or enlarging pulmonary nodules. Upper Abdomen: A calcification in the right hepatic lobe is unchanged. Cholecystectomy clips are again noted. There is a left upper pole renal cyst. There is moderate left hydronephrosis,  incompletely imaged, similar in appearance to the partially imaged kidney from the CTA chest from 09/30/2020. Musculoskeletal: No rib fracture is seen. There is no evidence of thoracic spine fracture. There is no suspicious osseous lesion. IMPRESSION: 1. No evidence of acute traumatic injury in the chest. 2. No focal airspace disease or pleural effusion. Electronically Signed   By: Valetta Mole M.D.   On: 11/21/2021 13:05   CT Cervical Spine Wo Contrast  Result Date: 11/21/2021 CLINICAL DATA:  Trauma EXAM: CT CERVICAL SPINE WITHOUT CONTRAST TECHNIQUE: Multidetector CT imaging of the cervical spine was performed without intravenous contrast. Multiplanar CT image reconstructions were also generated. RADIATION DOSE REDUCTION: This exam was performed according to the departmental dose-optimization program which includes automated exposure control, adjustment of the mA and/or kV according to patient size and/or use of iterative reconstruction technique. COMPARISON:  None. FINDINGS: Alignment: There is grade 1 retrolisthesis of C3 on C4. There is no jumped or perched facets or other evidence of traumatic malalignment. Skull base and vertebrae: Skull base alignment is maintained. Vertebral body heights are preserved. There is no evidence of acute fracture. Soft tissues and spinal canal: No prevertebral fluid or swelling. No visible canal hematoma. Disc levels: There is mild multilevel disc space narrowing, degenerative endplate change, and facet arthropathy. There is no high-grade spinal canal stenosis. There is severe left neural foraminal stenosis at C3-C4 and C4-C5, and moderate left neural foraminal stenosis at C5-C6. Upper chest: The lungs are assessed on the separately dictated CT chest. Other: None. IMPRESSION: 1. No acute fracture or traumatic malalignment of the cervical spine. 2. Multilevel degenerative changes as above. Electronically Signed   By: Valetta Mole M.D.   On: 11/21/2021 13:09   US Abdomen  Complete  Result Date: 11/22/2021 CLINICAL DATA:  Abnormal LFTs EXAM: ABDOMEN ULTRASOUND COMPLETE COMPARISON:  None. FINDINGS: Gallbladder: Surgically absent. Common bile duct: Diameter: 9 mm, within normal limits status post cholecystectomy. Liver: No focal lesion identified. Within normal limits in parenchymal echogenicity. Portal vein is patent on color Doppler imaging with normal direction of blood flow towards the liver. IVC: Limited evaluation due to overlying bowel gas Pancreas: Limited evaluation due to overlying bowel gas. Spleen: Size and appearance within normal limits. Right Kidney: Length: 8.4. Echogenicity within normal limits. No mass or hydronephrosis visualized. Calculus of the mid region of the right kidney measuring 7.2 mm. Left Kidney: Length: 8.5. Echogenicity within normal limits. Mild hydronephrosis. Simple cyst of the mid region of the left kidney measuring 2.5 x 2.0 x 1.7 cm. Simple cyst of the lower pole of the left kidney measuring 2.7 x 2.7 x 2.5 cm. Abdominal aorta: No aneurysm visualized. Evaluation somewhat limited due to overlying bowel gas. Other findings: None. IMPRESSION: 1. Mild left hydronephrosis. 2. Right nephrolithiasis. 3. Simple cysts of the left kidney. Electronically Signed   By: Yetta Glassman M.D.   On: 11/22/2021 09:51   US Carotid Bilateral  Result Date: 11/21/2021 EXAM: BILATERAL CAROTID DUPLEX ULTRASOUND TECHNIQUE: Pearline Cables scale imaging, color Doppler and duplex ultrasound were performed of bilateral carotid and vertebral  arteries in the neck. COMPARISON:  None. FINDINGS: Criteria: Quantification of carotid stenosis is based on velocity parameters that correlate the residual internal carotid diameter with NASCET-based stenosis levels, using the diameter of the distal internal carotid lumen as the denominator for stenosis measurement. The following velocity measurements were obtained: RIGHT ICA: 149/18 cm/sec CCA: [id number] cm/sec SYSTOLIC ICA/CCA RATIO:  1.6 ECA:  175 cm/sec LEFT ICA: 124/25 cm/sec CCA: [id number] cm/sec SYSTOLIC ICA/CCA RATIO:  1.3 ECA: 116 cm/sec RIGHT CAROTID ARTERY: There is hypoechoic/fibrofatty plaque in the right carotid bulb which results in less than 50% stenosis. Borderline elevated velocities, but normal ICA/CCA ratio. Normal low resistance waveforms in the internal carotid artery. RIGHT VERTEBRAL ARTERY:  Antegrade flow LEFT CAROTID ARTERY: No significant plaque appreciated in the left carotid circulation. LEFT VERTEBRAL ARTERY:  Antegrade flow IMPRESSION: 1. There is atherosclerotic plaque in the right carotid bulb which results in less than 50% stenosis by Doppler criteria. 2. No significant plaque appreciated in the left carotid circulation. 3. Bilateral vertebral arteries demonstrate normal antegrade flow. Electronically Signed   By: Albin Felling M.D.   On: 11/21/2021 15:30   ECHOCARDIOGRAM COMPLETE  Result Date: 11/21/2021    ECHOCARDIOGRAM REPORT   Patient Name:   Brian Stark Date of Exam: 11/21/2021 Medical Rec #:  [id number]       Height:       70.0 in Accession #:    [id number]      Weight:       128.0 lb Date of Birth:  Aug 29, 1946       BSA:          1.727 m Patient Age:    69 years        BP:           107/65 mmHg Patient Gender: M               HR:           88 bpm. Exam Location:  [facility] Procedure: 2D Echo, Cardiac Doppler and Color Doppler Indications:    Syncope R55  History:        Patient has prior history of Echocardiogram examinations, most                 recent 10/16/2019. Acute deep vein thrombosis (DVT) of femoral                 vein of right lower extremity ([place]), Acute pulmonary embolism                 ([place]), H/O: rheumatic fever (From Hx).  Sonographer:    Alvino Chapel RCS Referring Phys: [place]  1. Left ventricular ejection fraction, by estimation, is 65 to 70%. The left ventricle has normal function. The left ventricle has no regional wall motion abnormalities. Left ventricular  diastolic parameters are consistent with Grade I diastolic dysfunction (impaired relaxation).  2. Right ventricular systolic function is normal. The right ventricular size is normal. Tricuspid regurgitation signal is inadequate for assessing PA pressure.  3. The mitral valve is normal in structure. No evidence of mitral valve regurgitation. No evidence of mitral stenosis.  4. The aortic valve was not well visualized. Aortic valve regurgitation is not visualized. No aortic stenosis is present.  5. Aortic dilatation noted. There is mild dilatation of the aortic root, measuring 42 mm.  6. The inferior vena cava is normal in size with greater than 50% respiratory variability, suggesting right atrial  pressure of 3 mmHg. FINDINGS  Left Ventricle: Left ventricular ejection fraction, by estimation, is 65 to 70%. The left ventricle has normal function. The left ventricle has no regional wall motion abnormalities. Definity contrast agent was given IV to delineate the left ventricular  endocardial borders. The left ventricular internal cavity size was normal in size. There is no left ventricular hypertrophy. Left ventricular diastolic parameters are consistent with Grade I diastolic dysfunction (impaired relaxation). Normal left ventricular filling pressure. Right Ventricle: The right ventricular size is normal. Right vetricular wall thickness was not well visualized. Right ventricular systolic function is normal. Tricuspid regurgitation signal is inadequate for assessing PA pressure. Left Atrium: Left atrial size was normal in size. Right Atrium: Right atrial size was normal in size. Pericardium: There is no evidence of pericardial effusion. Mitral Valve: The mitral valve is normal in structure. No evidence of mitral valve regurgitation. No evidence of mitral valve stenosis. Tricuspid Valve: The tricuspid valve is normal in structure. Tricuspid valve regurgitation is not demonstrated. No evidence of tricuspid stenosis. Aortic  Valve: The aortic valve was not well visualized. Aortic valve regurgitation is not visualized. No aortic stenosis is present. Aortic valve mean gradient measures 2.6 mmHg. Aortic valve peak gradient measures 4.2 mmHg. Aortic valve area, by VTI measures 3.07 cm. Pulmonic Valve: The pulmonic valve was not well visualized. Pulmonic valve regurgitation is not visualized. No evidence of pulmonic stenosis. Aorta: Aortic dilatation noted. There is mild dilatation of the aortic root, measuring 42 mm. Venous: The inferior vena cava is normal in size with greater than 50% respiratory variability, suggesting right atrial pressure of 3 mmHg. IAS/Shunts: The interatrial septum was not well visualized.  LEFT VENTRICLE PLAX 2D LVIDd:         4.20 cm   Diastology LVIDs:         2.40 cm   LV e' medial:    8.59 cm/s LV PW:         1.00 cm   LV E/e' medial:  7.6 LV IVS:        0.90 cm   LV e' lateral:   11.60 cm/s LVOT diam:     2.20 cm   LV E/e' lateral: 5.7 LV SV:         69 LV SV Index:   40 LVOT Area:     3.80 cm  RIGHT VENTRICLE RV S prime:     14.50 cm/s LEFT ATRIUM             Index LA diam:        2.05 cm 1.19 cm/m LA Vol (A2C):   28.1 ml 16.27 ml/m LA Vol (A4C):   27.3 ml 15.81 ml/m LA Biplane Vol: 29.3 ml 16.97 ml/m  AORTIC VALVE AV Area (Vmax):    2.87 cm AV Area (Vmean):   2.85 cm AV Area (VTI):     3.07 cm AV Vmax:           102.94 cm/s AV Vmean:          76.302 cm/s AV VTI:            0.226 m AV Peak Grad:      4.2 mmHg AV Mean Grad:      2.6 mmHg LVOT Vmax:         77.70 cm/s LVOT Vmean:        57.300 cm/s LVOT VTI:          0.182 m LVOT/AV VTI ratio: 0.81  AORTA Ao Root diam: 4.20 cm MITRAL VALVE MV Area (PHT): 2.83 cm    SHUNTS MV Decel Time: 268 msec    Systemic VTI:  0.18 m MV E velocity: 65.60 cm/s  Systemic Diam: 2.20 cm MV A velocity: 90.60 cm/s MV E/A ratio:  0.72 Carlyle Dolly MD Electronically signed by Carlyle Dolly MD Signature Date/Time: 11/21/2021/4:30:20 PM    Final     Scheduled Meds:   apixaban  5 mg Oral BID   feeding supplement  237 mL Oral BID BM   folic acid  [id number] mcg Oral Daily   sodium chloride flush  3 mL Intravenous Q12H   vitamin B-12  500 mcg Oral Daily   Continuous Infusions:  sodium chloride       LOS: 0 days    Time spent: [place], MD [facility]   If 7PM-7AM, please contact night-coverage

## 2021-11-22 NOTE — TOC Progression Note (Signed)
°  Transition of Care [facility]) Screening Note   Patient Details  Name: Brian Stark Date of Birth: 08-May-1946   [facility] of [facility]) CM/SW Contact:    Elliot Gault, LCSW Phone Number: 11/22/2021, 11:11 AM    Transition of Care Department [facility]) has reviewed patient and no TOC needs have been identified at this time. We will continue to monitor patient advancement through interdisciplinary progression rounds. If new patient transition needs arise, please place a TOC consult.

## 2021-11-23 DIAGNOSIS — E43 Unspecified severe protein-calorie malnutrition: Secondary | ICD-10-CM | POA: Insufficient documentation

## 2021-11-23 LAB — COMPREHENSIVE METABOLIC PANEL
ALT: 81 U/L — ABNORMAL HIGH (ref 0–44)
AST: 112 U/L — ABNORMAL HIGH (ref 15–41)
Albumin: 2.8 g/dL — ABNORMAL LOW (ref 3.5–5.0)
Alkaline Phosphatase: 174 U/L — ABNORMAL HIGH (ref 38–126)
Anion gap: 4 — ABNORMAL LOW (ref 5–15)
BUN: 12 mg/dL (ref 8–23)
CO2: 26 mmol/L (ref 22–32)
Calcium: 8.8 mg/dL — ABNORMAL LOW (ref 8.9–10.3)
Chloride: 108 mmol/L (ref 98–111)
Creatinine, Ser: 1.12 mg/dL (ref 0.61–1.24)
GFR, Estimated: >60 mL/min (ref >60)
Glucose, Bld: 84 mg/dL (ref 70–99)
Potassium: 3.5 mmol/L (ref 3.5–5.1)
Sodium: 138 mmol/L (ref 135–145)
Total Bilirubin: 3.4 mg/dL — ABNORMAL HIGH (ref 0.3–1.2)
Total Protein: 5.1 g/dL — ABNORMAL LOW (ref 6.5–8.1)

## 2021-11-23 LAB — CBC
HCT: 28.9 % — ABNORMAL LOW (ref 39.0–52.0)
Hemoglobin: 9.8 g/dL — ABNORMAL LOW (ref 13.0–17.0)
MCH: 37.3 pg — ABNORMAL HIGH (ref 26.0–34.0)
MCHC: 33.9 g/dL (ref 30.0–36.0)
MCV: 109.9 fL — ABNORMAL HIGH (ref 80.0–100.0)
Platelets: 58 10*3/uL — ABNORMAL LOW (ref 150–400)
RBC: 2.63 MIL/uL — ABNORMAL LOW (ref 4.22–5.81)
RDW: 14.8 % (ref 11.5–15.5)
WBC: 2.9 10*3/uL — ABNORMAL LOW (ref 4.0–10.5)
nRBC: 0 % (ref 0.0–0.2)

## 2021-11-23 LAB — MAGNESIUM: Magnesium: 2 mg/dL (ref 1.7–2.4)

## 2021-11-23 LAB — PHOSPHORUS: Phosphorus: 2.4 mg/dL — ABNORMAL LOW (ref 2.5–4.6)

## 2021-11-23 LAB — GLUCOSE, CAPILLARY: Glucose-Capillary: 77 mg/dL (ref 70–99)

## 2021-11-23 NOTE — Discharge Summary (Signed)
Physician Discharge Summary  Brian GashRalph G Stark ZOX:[id number] DOB: [birth year] DOA: 11/21/2021  PCP: Brian Stark  Admit date: 11/21/2021  Discharge date: 11/23/2021  Admitted From: Home.  Disposition:  Home.  Recommendations for Outpatient Follow-up:  Follow up with PCP in 1-2 weeks. Please obtain BMP/CBC in one week. Advised to follow-up with gastroenterologist as scheduled. Advised to follow-up with urology as scheduled for kidney stones. Syncope work-up has completed.  Home Health: None Equipment/Devices: None  Discharge Condition: Stable CODE STATUS: Full code Diet recommendation: Heart Healthy   [facility]rief [facility]: This 76 years old male with PMH significant for history of PE, muscle spasms, vitamin B12 and folate deficiency, thrombocytopenia presented to the ED s/p syncopal episode after taking shower.  Patient denies any prodromal symptoms before syncope, Patient remained in the tub for some time,  does not remember the duration, He was not able to get up, his son has found him in the tub and has get him out of the tub,  He was able to walk towards the back.  Patient has been ambulating well, denies any headache, dizziness, nausea,  vomiting or palpitations.  Patient reports he was taking shower with warm water.  Patient was admitted for syncope work-up. CT head and skeletal survey unremarkable.  Echocardiogram showed LVEF 60 to 65%, no regional wall motion abnormalities.  Carotid duplex shows right ICA less than 50% stenosis,  Left ICA WNL.  Repeat labs showed elevated AST,  ALT and total bilirubin.  Abdominal ultrasound unremarkable except nephrolithiasis.  Patient was continued on IV hydration.  Next day LFTs has improved,  total bilirubin has improved.  Patient feels better and want to be discharged.  Patient is being discharged home,  advised to follow-up with gastroenterologist and follow-up with urologist for kidney stones.    He was managed for below problems.    Discharge Diagnoses:  Principal Problem:   Syncope and collapse Active Problems:   PE (pulmonary thromboembolism) (HCC)   Thrombocytopenia (HCC)   Vitamin B 12 deficiency   Protein-calorie malnutrition, severe  Syncope: Patient briefly passed out after taking shower. Patient denies any prodromal symptoms before passing out. Patient reports feeling generalized weakness otherwise denies any other symptoms. Admitted for observation. Continue telemetry monitoring 2D echo: LVEF 60 to 65%, no regional wall motion abnormalities. Carotid duplex: Right ICA: Less than 50% stenosis, left ICA WNL CT head and other imaging unremarkable. PT and OT evaluation.  Continue gentle IV hydration.   AKI on CKD II:  Baseline serum creatinine remains between 1.1-1.2. Presented with serum creatinine 1.47 Creatinine back to baseline.     Elevated liver enzymes: There is significant uptrend in liver enzymes. Total bilirubin elevated, ultrasound: Nephrolithiasis. Hepatitis panel negative, LFTs trending down.   History of pulmonary embolism: Continue Eliquis   Thrombocytopenia: No signs of any bleeding, continue to monitor.   Vitamin B12 deficiency: Continue vitamin B12 supplementation. Continue folic acid    Discharge Instructions  Discharge Instructions     Call Stark for:  difficulty breathing, headache or visual disturbances   Complete by: As directed    Call Stark for:  persistant dizziness or light-headedness   Complete by: As directed    Call Stark for:  persistant nausea and vomiting   Complete by: As directed    Diet - low sodium heart healthy   Complete by: As directed    Diet Carb Modified   Complete by: As directed    Discharge instructions   Complete by:  As directed    Advised to follow-up with primary care physician in 1 week. Advised to follow-up with gastroenterologist as scheduled. Syncope work-up has completed.   Increase activity slowly   Complete by: As directed        Allergies as of 11/23/2021       Reactions   Flucloxacillin Anaphylaxis        Medication List     TAKE these medications    Aimovig 140 MG/ML Soaj Generic drug: Erenumab-aooe Inject 140 mg into the skin every 30 (thirty) days.   apixaban 5 MG Tabs tablet Commonly known as: ELIQUIS Take 2 tablets by mouth twice a day for 7 days; then start taking 1 tablet by mouth twice a day on daily basis. What changed:  how much to take how to take this when to take this additional instructions   cyclobenzaprine 10 MG tablet Commonly known as: FLEXERIL Take 10 mg by mouth every 8 (eight) hours as needed.   folic acid A999333 MCG tablet Commonly known as: FOLVITE Take 1 tablet (400 mcg total) by mouth daily.   HYDROcodone-acetaminophen 5-325 MG tablet Commonly known as: NORCO/VICODIN Take 1 tablet by mouth every 6 (six) hours as needed.   ondansetron 4 MG tablet Commonly known as: ZOFRAN Take 4 mg by mouth every 6 (six) hours as needed for nausea or vomiting.   vitamin B-12 500 MCG tablet Commonly known as: CYANOCOBALAMIN Take 1 tablet (500 mcg total) by mouth daily.        Follow-up Information     Brian Stark Follow up in 1 week(s).   Specialty: Internal Medicine Contact information: [place] [address] [phone]         [facility] Follow up in 1 week(s).   Specialty: Gastroenterology Contact information: [address] [place] Follow up in 1 week(s).   Contact information: [address] [employer] [place] [id number] [phone]               Allergies  Allergen Reactions   Flucloxacillin Anaphylaxis    Consultations: None   Procedures/Studies: DG Pelvis 1-2 Views  Result Date: 11/21/2021 CLINICAL DATA:  Fall. EXAM: PELVIS - 1-2 VIEW COMPARISON:  CT abdomen pelvis  dated September 19, 2019. FINDINGS: There is no evidence of pelvic fracture or diastasis. No pelvic bone lesions are seen. IMPRESSION: Negative. Electronically Signed   By: Titus Dubin M.D.   On: 11/21/2021 13:12   CT Head Wo Contrast  Result Date: 11/21/2021 CLINICAL DATA:  Fall, trauma EXAM: CT HEAD WITHOUT CONTRAST TECHNIQUE: Contiguous axial images were obtained from the base of the skull through the vertex without intravenous contrast. RADIATION DOSE REDUCTION: This exam was performed according to the departmental dose-optimization program which includes automated exposure control, adjustment of the mA and/or kV according to patient size and/or use of iterative reconstruction technique. COMPARISON:  CT head 10/21/2018 FINDINGS: Brain: There is no evidence of acute intracranial hemorrhage, extra-axial fluid collection, or acute infarct. Parenchymal volume is within normal limits. The ventricles are normal in size. Patchy hypodensity in the subcortical and periventricular white matter likely reflects sequela of mild chronic white matter microangiopathy. There is no mass lesion.  There is no midline shift. Vascular: No hyperdense vessel or unexpected calcification. Skull: Postsurgical changes reflecting bifrontal craniotomy are noted. There is no calvarial fracture  or suspicious osseous lesion. Sinuses/Orbits: The imaged paranasal sinuses are clear. Bilateral lens implants are in place. The globes and orbits are otherwise unremarkable. Other: None. IMPRESSION: No acute intracranial hemorrhage or calvarial fracture. Electronically Signed   By: Valetta Mole M.D.   On: 11/21/2021 13:13   CT Chest Wo Contrast  Result Date: 11/21/2021 CLINICAL DATA:  Fall, tenderness to palpation in midline T-spine and posterior right ribs EXAM: CT CHEST WITHOUT CONTRAST TECHNIQUE: Multidetector CT imaging of the chest was performed following the standard protocol without IV contrast. RADIATION DOSE REDUCTION: This exam was  performed according to the departmental dose-optimization program which includes automated exposure control, adjustment of the mA and/or kV according to patient size and/or use of iterative reconstruction technique. COMPARISON:  CT chest 08/09/2021 FINDINGS: Cardiovascular: The heart is not enlarged. There is no pericardial effusion. There is minimal calcification of the thoracic aorta. The vasculature is otherwise unremarkable, as can be evaluated in the absence of intravenous contrast. Mediastinum/Nodes: The thyroid is unremarkable. The esophagus is diffusely patulous, unchanged. There is no mediastinal or axillary lymphadenopathy. There is no bulky hilar adenopathy. Lungs/Pleura: The trachea and central airways are patent. The lungs are well inflated. Linear opacities in the right base likely reflect atelectasis and/or scar. There is no focal consolidation. There is no pulmonary edema. There is no pleural effusion or pneumothorax. There is no evidence of traumatic parenchymal injury. There are no new or enlarging pulmonary nodules. Upper Abdomen: A calcification in the right hepatic lobe is unchanged. Cholecystectomy clips are again noted. There is a left upper pole renal cyst. There is moderate left hydronephrosis, incompletely imaged, similar in appearance to the partially imaged kidney from the CTA chest from 09/30/2020. Musculoskeletal: No rib fracture is seen. There is no evidence of thoracic spine fracture. There is no suspicious osseous lesion. IMPRESSION: 1. No evidence of acute traumatic injury in the chest. 2. No focal airspace disease or pleural effusion. Electronically Signed   By: Valetta Mole M.D.   On: 11/21/2021 13:05   CT Cervical Spine Wo Contrast  Result Date: 11/21/2021 CLINICAL DATA:  Trauma EXAM: CT CERVICAL SPINE WITHOUT CONTRAST TECHNIQUE: Multidetector CT imaging of the cervical spine was performed without intravenous contrast. Multiplanar CT image reconstructions were also generated.  RADIATION DOSE REDUCTION: This exam was performed according to the departmental dose-optimization program which includes automated exposure control, adjustment of the mA and/or kV according to patient size and/or use of iterative reconstruction technique. COMPARISON:  None. FINDINGS: Alignment: There is grade 1 retrolisthesis of C3 on C4. There is no jumped or perched facets or other evidence of traumatic malalignment. Skull base and vertebrae: Skull base alignment is maintained. Vertebral body heights are preserved. There is no evidence of acute fracture. Soft tissues and spinal canal: No prevertebral fluid or swelling. No visible canal hematoma. Disc levels: There is mild multilevel disc space narrowing, degenerative endplate change, and facet arthropathy. There is no high-grade spinal canal stenosis. There is severe left neural foraminal stenosis at C3-C4 and C4-C5, and moderate left neural foraminal stenosis at C5-C6. Upper chest: The lungs are assessed on the separately dictated CT chest. Other: None. IMPRESSION: 1. No acute fracture or traumatic malalignment of the cervical spine. 2. Multilevel degenerative changes as above. Electronically Signed   By: Valetta Mole M.D.   On: 11/21/2021 13:09   US Abdomen Complete  Result Date: 11/22/2021 CLINICAL DATA:  Abnormal LFTs EXAM: ABDOMEN ULTRASOUND COMPLETE COMPARISON:  None. FINDINGS: Gallbladder: Surgically absent. Common bile duct:  Diameter: 9 mm, within normal limits status post cholecystectomy. Liver: No focal lesion identified. Within normal limits in parenchymal echogenicity. Portal vein is patent on color Doppler imaging with normal direction of blood flow towards the liver. IVC: Limited evaluation due to overlying bowel gas Pancreas: Limited evaluation due to overlying bowel gas. Spleen: Size and appearance within normal limits. Right Kidney: Length: 8.4. Echogenicity within normal limits. No mass or hydronephrosis visualized. Calculus of the mid region  of the right kidney measuring 7.2 mm. Left Kidney: Length: 8.5. Echogenicity within normal limits. Mild hydronephrosis. Simple cyst of the mid region of the left kidney measuring 2.5 x 2.0 x 1.7 cm. Simple cyst of the lower pole of the left kidney measuring 2.7 x 2.7 x 2.5 cm. Abdominal aorta: No aneurysm visualized. Evaluation somewhat limited due to overlying bowel gas. Other findings: None. IMPRESSION: 1. Mild left hydronephrosis. 2. Right nephrolithiasis. 3. Simple cysts of the left kidney. Electronically Signed   By: Yetta Glassman M.D.   On: 11/22/2021 09:51   US Carotid Bilateral  Result Date: 11/21/2021 EXAM: BILATERAL CAROTID DUPLEX ULTRASOUND TECHNIQUE: Pearline Cables scale imaging, color Doppler and duplex ultrasound were performed of bilateral carotid and vertebral arteries in the neck. COMPARISON:  None. FINDINGS: Criteria: Quantification of carotid stenosis is based on velocity parameters that correlate the residual internal carotid diameter with NASCET-based stenosis levels, using the diameter of the distal internal carotid lumen as the denominator for stenosis measurement. The following velocity measurements were obtained: RIGHT ICA: 149/18 cm/sec CCA: [id number] cm/sec SYSTOLIC ICA/CCA RATIO:  1.6 ECA: 175 cm/sec LEFT ICA: 124/25 cm/sec CCA: [id number] cm/sec SYSTOLIC ICA/CCA RATIO:  1.3 ECA: 116 cm/sec RIGHT CAROTID ARTERY: There is hypoechoic/fibrofatty plaque in the right carotid bulb which results in less than 50% stenosis. Borderline elevated velocities, but normal ICA/CCA ratio. Normal low resistance waveforms in the internal carotid artery. RIGHT VERTEBRAL ARTERY:  Antegrade flow LEFT CAROTID ARTERY: No significant plaque appreciated in the left carotid circulation. LEFT VERTEBRAL ARTERY:  Antegrade flow IMPRESSION: 1. There is atherosclerotic plaque in the right carotid bulb which results in less than 50% stenosis by Doppler criteria. 2. No significant plaque appreciated in the left carotid circulation.  3. Bilateral vertebral arteries demonstrate normal antegrade flow. Electronically Signed   By: Albin Felling M.D.   On: 11/21/2021 15:30   ECHOCARDIOGRAM COMPLETE  Result Date: 11/21/2021    ECHOCARDIOGRAM REPORT   Patient Name:   VANSON SOLDAN Date of Exam: 11/21/2021 Medical Rec #:  [id number]       Height:       70.0 in Accession #:    [id number]      Weight:       128.0 lb Date of Birth:  02/25/1946       BSA:          1.727 m Patient Age:    25 years        BP:           107/65 mmHg Patient Gender: M               HR:           88 bpm. Exam Location:  [facility] Procedure: 2D Echo, Cardiac Doppler and Color Doppler Indications:    Syncope R55  History:        Patient has prior history of Echocardiogram examinations, most                 recent 10/16/2019. Acute deep  vein thrombosis (DVT) of femoral                 vein of right lower extremity ([place]), Acute pulmonary embolism                 ([place]), H/O: rheumatic fever (From Hx).  Sonographer:    Alvino Chapel RCS Referring Phys: [place]  1. Left ventricular ejection fraction, by estimation, is 65 to 70%. The left ventricle has normal function. The left ventricle has no regional wall motion abnormalities. Left ventricular diastolic parameters are consistent with Grade I diastolic dysfunction (impaired relaxation).  2. Right ventricular systolic function is normal. The right ventricular size is normal. Tricuspid regurgitation signal is inadequate for assessing PA pressure.  3. The mitral valve is normal in structure. No evidence of mitral valve regurgitation. No evidence of mitral stenosis.  4. The aortic valve was not well visualized. Aortic valve regurgitation is not visualized. No aortic stenosis is present.  5. Aortic dilatation noted. There is mild dilatation of the aortic root, measuring 42 mm.  6. The inferior vena cava is normal in size with greater than 50% respiratory variability, suggesting right atrial pressure of 3 mmHg.  FINDINGS  Left Ventricle: Left ventricular ejection fraction, by estimation, is 65 to 70%. The left ventricle has normal function. The left ventricle has no regional wall motion abnormalities. Definity contrast agent was given IV to delineate the left ventricular  endocardial borders. The left ventricular internal cavity size was normal in size. There is no left ventricular hypertrophy. Left ventricular diastolic parameters are consistent with Grade I diastolic dysfunction (impaired relaxation). Normal left ventricular filling pressure. Right Ventricle: The right ventricular size is normal. Right vetricular wall thickness was not well visualized. Right ventricular systolic function is normal. Tricuspid regurgitation signal is inadequate for assessing PA pressure. Left Atrium: Left atrial size was normal in size. Right Atrium: Right atrial size was normal in size. Pericardium: There is no evidence of pericardial effusion. Mitral Valve: The mitral valve is normal in structure. No evidence of mitral valve regurgitation. No evidence of mitral valve stenosis. Tricuspid Valve: The tricuspid valve is normal in structure. Tricuspid valve regurgitation is not demonstrated. No evidence of tricuspid stenosis. Aortic Valve: The aortic valve was not well visualized. Aortic valve regurgitation is not visualized. No aortic stenosis is present. Aortic valve mean gradient measures 2.6 mmHg. Aortic valve peak gradient measures 4.2 mmHg. Aortic valve area, by VTI measures 3.07 cm. Pulmonic Valve: The pulmonic valve was not well visualized. Pulmonic valve regurgitation is not visualized. No evidence of pulmonic stenosis. Aorta: Aortic dilatation noted. There is mild dilatation of the aortic root, measuring 42 mm. Venous: The inferior vena cava is normal in size with greater than 50% respiratory variability, suggesting right atrial pressure of 3 mmHg. IAS/Shunts: The interatrial septum was not well visualized.  LEFT VENTRICLE PLAX 2D  LVIDd:         4.20 cm   Diastology LVIDs:         2.40 cm   LV e' medial:    8.59 cm/s LV PW:         1.00 cm   LV E/e' medial:  7.6 LV IVS:        0.90 cm   LV e' lateral:   11.60 cm/s LVOT diam:     2.20 cm   LV E/e' lateral: 5.7 LV SV:         69 LV SV Index:  40 LVOT Area:     3.80 cm  RIGHT VENTRICLE RV S prime:     14.50 cm/s LEFT ATRIUM             Index LA diam:        2.05 cm 1.19 cm/m LA Vol (A2C):   28.1 ml 16.27 ml/m LA Vol (A4C):   27.3 ml 15.81 ml/m LA Biplane Vol: 29.3 ml 16.97 ml/m  AORTIC VALVE AV Area (Vmax):    2.87 cm AV Area (Vmean):   2.85 cm AV Area (VTI):     3.07 cm AV Vmax:           102.94 cm/s AV Vmean:          76.302 cm/s AV VTI:            0.226 m AV Peak Grad:      4.2 mmHg AV Mean Grad:      2.6 mmHg LVOT Vmax:         77.70 cm/s LVOT Vmean:        57.300 cm/s LVOT VTI:          0.182 m LVOT/AV VTI ratio: 0.81  AORTA Ao Root diam: 4.20 cm MITRAL VALVE MV Area (PHT): 2.83 cm    SHUNTS MV Decel Time: 268 msec    Systemic VTI:  0.18 m MV E velocity: 65.60 cm/s  Systemic Diam: 2.20 cm MV A velocity: 90.60 cm/s MV E/A ratio:  0.72 Carlyle Dolly Stark Electronically signed by Carlyle Dolly Stark Signature Date/Time: 11/21/2021/4:30:20 PM    Final    Echo, carotid duplex.   Subjective: Patient is seen and examined at bedside.  Overnight events noted.  Patient reports feeling much improved. Denies any symptoms.  Patient is being discharged home.  Discharge Exam: Vitals:   11/22/21 2105 11/23/21 0552  BP: 131/85 133/89  Pulse: 83 78  Resp: 18 14  Temp: 98 F (36.7 C) 97.8 F (36.6 C)  SpO2: 96% 97%   Vitals:   11/22/21 1351 11/22/21 2105 11/23/21 0552 11/23/21 0635  BP: (!) 138/91 131/85 133/89   Pulse: 89 83 78   Resp: 20 18 14    Temp: 98.1 F (36.7 C) 98 F (36.7 C) 97.8 F (36.6 C)   TempSrc: Oral Oral Oral   SpO2: 98% 96% 97%   Weight:    55.6 kg  Height:        General: Pt is alert, awake, not in acute distress Cardiovascular: RRR, S1/S2 +, no  rubs, no gallops Respiratory: CTA bilaterally, no wheezing, no rhonchi Abdominal: Soft, NT, ND, bowel sounds + Extremities: no edema, no cyanosis    The results of significant diagnostics from this hospitalization (including imaging, microbiology, ancillary and laboratory) are listed below for reference.     Microbiology: Recent Results (from the past 240 hour(s))  Resp Panel by RT-PCR (Flu A&B, Covid) Nasopharyngeal Swab     Status: None   Collection Time: 11/21/21 11:47 AM   Specimen: Nasopharyngeal Swab; Nasopharyngeal(NP) swabs in vial transport medium  Result Value Ref Range Status   SARS Coronavirus 2 by RT PCR NEGATIVE NEGATIVE Final    Comment: (NOTE) SARS-CoV-2 target nucleic acids are NOT DETECTED.  The SARS-CoV-2 RNA is generally detectable in upper respiratory specimens during the acute phase of infection. The lowest concentration of SARS-CoV-2 viral copies this assay can detect is 138 copies/mL. A negative result does not preclude SARS-Cov-2 infection and should not be used as the sole basis for  treatment or other patient management decisions. A negative result may occur with  improper specimen collection/handling, submission of specimen other than nasopharyngeal swab, presence of viral mutation(s) within the areas targeted by this assay, and inadequate number of viral copies(<138 copies/mL). A negative result must be combined with clinical observations, patient history, and epidemiological information. The expected result is Negative.  Fact Sheet for Patients:  EntrepreneurPulse.com.au  Fact Sheet for Healthcare Providers:  IncredibleEmployment.be  This test is no t yet approved or cleared by the [place] FDA and  has been authorized for detection and/or diagnosis of SARS-CoV-2 by FDA under an Emergency Use Authorization (EUA). This EUA will remain  in effect (meaning this test can be used) for the duration of  the COVID-19 declaration under Section 564(b)(1) of the Act, 21 U.S.C.section 360bbb-3(b)(1), unless the authorization is terminated  or revoked sooner.       Influenza A by PCR NEGATIVE NEGATIVE Final   Influenza B by PCR NEGATIVE NEGATIVE Final    Comment: (NOTE) The Xpert Xpress SARS-CoV-2/FLU/RSV plus assay is intended as an aid in the diagnosis of influenza from Nasopharyngeal swab specimens and should not be used as a sole basis for treatment. Nasal washings and aspirates are unacceptable for Xpert Xpress SARS-CoV-2/FLU/RSV testing.  Fact Sheet for Patients: EntrepreneurPulse.com.au  Fact Sheet for Healthcare Providers: IncredibleEmployment.be  This test is not yet approved or cleared by the [place] FDA and has been authorized for detection and/or diagnosis of SARS-CoV-2 by FDA under an Emergency Use Authorization (EUA). This EUA will remain in effect (meaning this test can be used) for the duration of the COVID-19 declaration under Section 564(b)(1) of the Act, 21 U.S.C. section 360bbb-3(b)(1), unless the authorization is terminated or revoked.  Performed at [facility], [address], [place], [place] [address]      Labs: BNP (last 3 results) No results for input(s): BNP in the last 8760 hours. Basic Metabolic Panel: Recent Labs  Lab 11/21/21 1142 11/22/21 0423 11/23/21 0401  NA 138 141 138  K 4.3 3.6 3.5  CL 106 106 108  CO2 24 27 26   GLUCOSE 82 86 84  BUN 15 17 12   CREATININE 1.47* 1.25* 1.12  CALCIUM 8.7* 9.1 8.8*  MG  --  2.2 2.0  PHOS  --  2.7 2.4*   Liver Function Tests: Recent Labs  Lab 11/21/21 1142 11/22/21 0423 11/23/21 0401  AST 69* 379* 112*  ALT 36 147* 81*  ALKPHOS 114 205* 174*  BILITOT 1.3* 5.0* 3.4*  PROT 5.7* 6.0* 5.1*  ALBUMIN 3.4* 3.3* 2.8*   No results for input(s): LIPASE, AMYLASE in the last 168 hours. No results for input(s): AMMONIA in the last 168 hours. CBC: Recent Labs   Lab 11/21/21 1142 11/22/21 0423 11/23/21 0401  WBC 6.1 4.2 2.9*  NEUTROABS 4.7  --   --   HGB 10.7* 11.5* 9.8*  HCT 31.4* 34.8* 28.9*  MCV 110.2* 111.5* 109.9*  PLT 76* 80* 58*   Cardiac Enzymes: No results for input(s): CKTOTAL, CKMB, CKMBINDEX, TROPONINI in the last 168 hours. BNP: Invalid input(s): POCBNP CBG: Recent Labs  Lab 11/23/21 0657  GLUCAP 77   D-Dimer No results for input(s): DDIMER in the last 72 hours. Hgb A1c No results for input(s): HGBA1C in the last 72 hours. Lipid Profile No results for input(s): CHOL, HDL, LDLCALC, TRIG, CHOLHDL, LDLDIRECT in the last 72 hours. Thyroid function studies Recent Labs    11/21/21 1143  TSH 1.068   Anemia  work up No results for input(s): VITAMINB12, FOLATE, FERRITIN, TIBC, IRON, RETICCTPCT in the last 72 hours. Urinalysis    Component Value Date/Time   COLORURINE YELLOW 11/21/2021 1223   APPEARANCEUR CLEAR 11/21/2021 1223   LABSPEC 1.004 (L) 11/21/2021 1223   PHURINE 6.0 11/21/2021 1223   GLUCOSEU NEGATIVE 11/21/2021 1223   HGBUR MODERATE (A) 11/21/2021 1223   BILIRUBINUR NEGATIVE 11/21/2021 1223   KETONESUR NEGATIVE 11/21/2021 1223   PROTEINUR NEGATIVE 11/21/2021 1223   NITRITE NEGATIVE 11/21/2021 1223   LEUKOCYTESUR NEGATIVE 11/21/2021 1223   Sepsis Labs Invalid input(s): PROCALCITONIN,  WBC,  LACTICIDVEN Microbiology Recent Results (from the past 240 hour(s))  Resp Panel by RT-PCR (Flu A&B, Covid) Nasopharyngeal Swab     Status: None   Collection Time: 11/21/21 11:47 AM   Specimen: Nasopharyngeal Swab; Nasopharyngeal(NP) swabs in vial transport medium  Result Value Ref Range Status   SARS Coronavirus 2 by RT PCR NEGATIVE NEGATIVE Final    Comment: (NOTE) SARS-CoV-2 target nucleic acids are NOT DETECTED.  The SARS-CoV-2 RNA is generally detectable in upper respiratory specimens during the acute phase of infection. The lowest concentration of SARS-CoV-2 viral copies this assay can detect is 138  copies/mL. A negative result does not preclude SARS-Cov-2 infection and should not be used as the sole basis for treatment or other patient management decisions. A negative result may occur with  improper specimen collection/handling, submission of specimen other than nasopharyngeal swab, presence of viral mutation(s) within the areas targeted by this assay, and inadequate number of viral copies(<138 copies/mL). A negative result must be combined with clinical observations, patient history, and epidemiological information. The expected result is Negative.  Fact Sheet for Patients:  EntrepreneurPulse.com.au  Fact Sheet for Healthcare Providers:  IncredibleEmployment.be  This test is no t yet approved or cleared by the [place] FDA and  has been authorized for detection and/or diagnosis of SARS-CoV-2 by FDA under an Emergency Use Authorization (EUA). This EUA will remain  in effect (meaning this test can be used) for the duration of the COVID-19 declaration under Section 564(b)(1) of the Act, 21 U.S.C.section 360bbb-3(b)(1), unless the authorization is terminated  or revoked sooner.       Influenza A by PCR NEGATIVE NEGATIVE Final   Influenza B by PCR NEGATIVE NEGATIVE Final    Comment: (NOTE) The Xpert Xpress SARS-CoV-2/FLU/RSV plus assay is intended as an aid in the diagnosis of influenza from Nasopharyngeal swab specimens and should not be used as a sole basis for treatment. Nasal washings and aspirates are unacceptable for Xpert Xpress SARS-CoV-2/FLU/RSV testing.  Fact Sheet for Patients: EntrepreneurPulse.com.au  Fact Sheet for Healthcare Providers: IncredibleEmployment.be  This test is not yet approved or cleared by the [place] FDA and has been authorized for detection and/or diagnosis of SARS-CoV-2 by FDA under an Emergency Use Authorization (EUA). This EUA will remain in effect (meaning  this test can be used) for the duration of the COVID-19 declaration under Section 564(b)(1) of the Act, 21 U.S.C. section 360bbb-3(b)(1), unless the authorization is terminated or revoked.  Performed at [facility], [address], [place], [place] [address]      Time coordinating discharge: Over 30 minutes  SIGNED:   Shawna Clamp, Stark  [facility] 11/23/2021, 1:20 PM Pager   If 7PM-7AM, please contact night-coverage

## 2021-11-23 NOTE — Discharge Instructions (Signed)
Advised to follow-up with primary care physician in 1 week. Advised to follow-up with gastroenterologist as scheduled. Advised to follow-up with urology as scheduled. Syncope work-up has completed.

## 2022-03-06 ENCOUNTER — Inpatient Hospital Stay (HOSPITAL_COMMUNITY): Payer: Medicare Other | Attending: Hematology

## 2022-03-06 DIAGNOSIS — Z7901 Long term (current) use of anticoagulants: Secondary | ICD-10-CM | POA: Diagnosis not present

## 2022-03-06 DIAGNOSIS — Z86711 Personal history of pulmonary embolism: Secondary | ICD-10-CM | POA: Diagnosis not present

## 2022-03-06 DIAGNOSIS — D696 Thrombocytopenia, unspecified: Secondary | ICD-10-CM | POA: Diagnosis present

## 2022-03-06 DIAGNOSIS — E538 Deficiency of other specified B group vitamins: Secondary | ICD-10-CM

## 2022-03-06 DIAGNOSIS — D539 Nutritional anemia, unspecified: Secondary | ICD-10-CM

## 2022-03-06 DIAGNOSIS — D649 Anemia, unspecified: Secondary | ICD-10-CM | POA: Insufficient documentation

## 2022-03-06 LAB — CBC WITH DIFFERENTIAL/PLATELET
Abs Immature Granulocytes: 0.01 10*3/uL (ref 0.00–0.07)
Basophils Absolute: 0 10*3/uL (ref 0.0–0.1)
Basophils Relative: 1 %
Eosinophils Absolute: 0.1 10*3/uL (ref 0.0–0.5)
Eosinophils Relative: 2 %
HCT: 33.9 % — ABNORMAL LOW (ref 39.0–52.0)
Hemoglobin: 11.1 g/dL — ABNORMAL LOW (ref 13.0–17.0)
Immature Granulocytes: 0 %
Lymphocytes Relative: 31 %
Lymphs Abs: 1.2 10*3/uL (ref 0.7–4.0)
MCH: 35.4 pg — ABNORMAL HIGH (ref 26.0–34.0)
MCHC: 32.7 g/dL (ref 30.0–36.0)
MCV: 108 fL — ABNORMAL HIGH (ref 80.0–100.0)
Monocytes Absolute: 0.7 10*3/uL (ref 0.1–1.0)
Monocytes Relative: 18 %
Neutro Abs: 1.9 10*3/uL (ref 1.7–7.7)
Neutrophils Relative %: 48 %
Platelets: 132 10*3/uL — ABNORMAL LOW (ref 150–400)
RBC: 3.14 MIL/uL — ABNORMAL LOW (ref 4.22–5.81)
RDW: 14.6 % (ref 11.5–15.5)
WBC: 3.8 10*3/uL — ABNORMAL LOW (ref 4.0–10.5)
nRBC: 0 % (ref 0.0–0.2)

## 2022-03-06 LAB — IRON AND TIBC
Iron: 82 ug/dL (ref 45–182)
Saturation Ratios: 30 % (ref 17.9–39.5)
TIBC: 272 ug/dL (ref 250–450)
UIBC: 190 ug/dL

## 2022-03-06 LAB — FERRITIN: Ferritin: 143 ng/mL (ref 24–336)

## 2022-03-06 LAB — VITAMIN B12: Vitamin B-12: 511 pg/mL (ref 180–914)

## 2022-03-06 LAB — FOLATE: Folate: 9.4 ng/mL (ref >5.9)

## 2022-03-07 LAB — HOMOCYSTEINE: Homocysteine: 29 umol/L — ABNORMAL HIGH (ref 0.0–19.2)

## 2022-03-12 LAB — METHYLMALONIC ACID, SERUM: Methylmalonic Acid, Quantitative: 548 nmol/L — ABNORMAL HIGH (ref 0–378)

## 2022-03-13 ENCOUNTER — Ambulatory Visit (HOSPITAL_COMMUNITY): Payer: Medicare Other | Admitting: Physician Assistant

## 2022-03-14 ENCOUNTER — Emergency Department (HOSPITAL_COMMUNITY): Payer: Medicare Other

## 2022-03-14 ENCOUNTER — Encounter (HOSPITAL_COMMUNITY): Payer: Self-pay

## 2022-03-14 ENCOUNTER — Emergency Department (HOSPITAL_COMMUNITY)
Admission: EM | Admit: 2022-03-14 | Discharge: 2022-03-14 | Disposition: A | Discharge status: Home / Self Care | Payer: Medicare Other | Attending: Emergency Medicine | Admitting: Emergency Medicine

## 2022-03-14 DIAGNOSIS — S0990XA Unspecified injury of head, initial encounter: Secondary | ICD-10-CM | POA: Insufficient documentation

## 2022-03-14 DIAGNOSIS — M25551 Pain in right hip: Secondary | ICD-10-CM | POA: Insufficient documentation

## 2022-03-14 DIAGNOSIS — Y92019 Unspecified place in single-family (private) house as the place of occurrence of the external cause: Secondary | ICD-10-CM | POA: Diagnosis not present

## 2022-03-14 DIAGNOSIS — S299XXA Unspecified injury of thorax, initial encounter: Secondary | ICD-10-CM | POA: Diagnosis present

## 2022-03-14 DIAGNOSIS — M160 Bilateral primary osteoarthritis of hip: Secondary | ICD-10-CM | POA: Diagnosis not present

## 2022-03-14 DIAGNOSIS — Z7901 Long term (current) use of anticoagulants: Secondary | ICD-10-CM | POA: Diagnosis not present

## 2022-03-14 DIAGNOSIS — W010XXA Fall on same level from slipping, tripping and stumbling without subsequent striking against object, initial encounter: Secondary | ICD-10-CM | POA: Diagnosis not present

## 2022-03-14 DIAGNOSIS — S20311A Abrasion of right front wall of thorax, initial encounter: Secondary | ICD-10-CM | POA: Diagnosis not present

## 2022-03-14 DIAGNOSIS — Z79899 Other long term (current) drug therapy: Secondary | ICD-10-CM | POA: Insufficient documentation

## 2022-03-14 DIAGNOSIS — R042 Hemoptysis: Secondary | ICD-10-CM | POA: Diagnosis not present

## 2022-03-14 DIAGNOSIS — R0781 Pleurodynia: Secondary | ICD-10-CM | POA: Diagnosis present

## 2022-03-14 DIAGNOSIS — K0889 Other specified disorders of teeth and supporting structures: Secondary | ICD-10-CM

## 2022-03-14 DIAGNOSIS — M25552 Pain in left hip: Secondary | ICD-10-CM | POA: Diagnosis not present

## 2022-03-14 DIAGNOSIS — W19XXXA Unspecified fall, initial encounter: Secondary | ICD-10-CM

## 2022-03-14 HISTORY — DX: Complete loss of teeth due to other specified cause, unspecified class: K08.199

## 2022-03-14 LAB — CBC WITH DIFFERENTIAL/PLATELET
Abs Immature Granulocytes: 0.01 10*3/uL (ref 0.00–0.07)
Basophils Absolute: 0 10*3/uL (ref 0.0–0.1)
Basophils Relative: 1 %
Eosinophils Absolute: 0.1 10*3/uL (ref 0.0–0.5)
Eosinophils Relative: 2 %
HCT: 31.3 % — ABNORMAL LOW (ref 39.0–52.0)
Hemoglobin: 10.5 g/dL — ABNORMAL LOW (ref 13.0–17.0)
Immature Granulocytes: 0 %
Lymphocytes Relative: 27 %
Lymphs Abs: 0.9 10*3/uL (ref 0.7–4.0)
MCH: 35.8 pg — ABNORMAL HIGH (ref 26.0–34.0)
MCHC: 33.5 g/dL (ref 30.0–36.0)
MCV: 106.8 fL — ABNORMAL HIGH (ref 80.0–100.0)
Monocytes Absolute: 0.4 10*3/uL (ref 0.1–1.0)
Monocytes Relative: 12 %
Neutro Abs: 2 10*3/uL (ref 1.7–7.7)
Neutrophils Relative %: 58 %
Platelets: 77 10*3/uL — ABNORMAL LOW (ref 150–400)
RBC: 2.93 MIL/uL — ABNORMAL LOW (ref 4.22–5.81)
RDW: 14.4 % (ref 11.5–15.5)
WBC: 3.5 10*3/uL — ABNORMAL LOW (ref 4.0–10.5)
nRBC: 0 % (ref 0.0–0.2)

## 2022-03-14 LAB — BASIC METABOLIC PANEL
Anion gap: 6 (ref 5–15)
BUN: 15 mg/dL (ref 8–23)
CO2: 26 mmol/L (ref 22–32)
Calcium: 8.9 mg/dL (ref 8.9–10.3)
Chloride: 105 mmol/L (ref 98–111)
Creatinine, Ser: 1.44 mg/dL — ABNORMAL HIGH (ref 0.61–1.24)
GFR, Estimated: 51 mL/min — ABNORMAL LOW (ref >60)
Glucose, Bld: 91 mg/dL (ref 70–99)
Potassium: 3.7 mmol/L (ref 3.5–5.1)
Sodium: 137 mmol/L (ref 135–145)

## 2022-03-14 LAB — PROTIME-INR
INR: 1.5 — ABNORMAL HIGH (ref 0.8–1.2)
Prothrombin Time: 18.4 seconds — ABNORMAL HIGH (ref 11.4–15.2)

## 2022-03-14 MED ORDER — CLINDAMYCIN HCL 300 MG PO CAPS: 300.0000 mg | ORAL_CAPSULE | Freq: Three times a day (TID) | ORAL | 0 refills | Status: DC

## 2022-03-14 MED ORDER — HYDROCODONE-ACETAMINOPHEN 5-325 MG PO TABS: ORAL_TABLET | ORAL | 0 refills | Status: AC

## 2022-03-14 NOTE — Discharge Instructions (Signed)
Wear the brace as needed for support to your wrist.  Your x-rays today did not show evidence of any broken bones or punctured lung.  Take the antibiotic as directed for your mouth pain.  Please follow-up with your dentist next week for recheck.  Also, keep your upcoming appointment with Dr. Delton Coombes.  Return to the emergency department for any new or worsening symptoms. ?

## 2022-03-14 NOTE — ED Provider Notes (Signed)
?[facility]PART[facility] ?Provider Note ? ? ?CSN: [id number] ?Arrival date & time: 03/14/22  6629 ? ?  ? ?History ? ?Chief Complaint  ?Patient presents with  ? Fall  ? ? ?Brian Stark is a 76 y.o. male. ? ? ?Fall ?Associated symptoms include chest pain (Right rib pain). Pertinent negatives include no abdominal pain, no headaches and no shortness of breath.  ? ?  ? ? ?Brian Stark is a 76 y.o. 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  He states that he tripped on an area on the floor where the carpet and linoleum meet.  Larey Seat to his right side.  Unsure of head injury, but denies loss of consciousness.  Notes began coughing and spitting up blood yesterday.  He endorses having some dental extractions over the last several weeks, he is also noted some tenderness along the right side of his mouth.  He is unsure if this pain is related to the fall or to his recent dental work.  The hemoptysis is intermittent.  He denies any cough or shortness of breath.  He does have some discomfort to the lateral right ribs.  He denies any nausea or vomiting numbness or weakness.  No neck pain. ? ? ?Home Medications ?Prior to Admission medications   ?Medication Sig Start Date End Date Taking? Authorizing Provider  ?AIMOVIG 140 MG/ML SOAJ Inject 140 mg into the skin every 30 (thirty) days. 05/28/21   [provider]  ?apixaban (ELIQUIS) 5 MG TABS tablet Take 2 tablets by mouth twice a day for 7 days; then start taking 1 tablet by mouth twice a day on daily basis. ?Patient taking differently: Take 5 mg by mouth 2 (two) times daily. 10/01/20   Vassie Loll, MD  ?cyclobenzaprine (FLEXERIL) 10 MG tablet Take 10 mg by mouth every 8 (eight) hours as needed. 08/18/19   [provider]  ?folic  acid (FOLVITE) 400 MCG tablet Take 1 tablet (400 mcg total) by mouth daily. 11/07/21   Carnella Guadalajara, PA-C  ?HYDROcodone-acetaminophen (NORCO/VICODIN) 5-325 MG tablet Take 1 tablet by mouth every 6 (six) hours as needed. 08/18/19   [provider]  ?ondansetron (ZOFRAN) 4 MG tablet Take 4 mg by mouth every 6 (six) hours as needed for nausea or vomiting.    [provider]  ?vitamin B-12 (CYANOCOBALAMIN) 500 MCG tablet Take 1 tablet (500 mcg total) by mouth daily. 11/07/21   Carnella Guadalajara, PA-C  ?   ? ?Allergies    ?Floxacillin (flucloxacillin)   ? ?Review of Systems   ?Review of Systems  ?HENT:  Negative for facial swelling and trouble swallowing.   ?     Tenderness of the right upper gums  ?Eyes:  Negative for visual disturbance.  ?Respiratory:  Negative for shortness of breath and wheezing.   ?     Hemoptysis  ?Cardiovascular:  Positive for chest pain (Right rib pain). Negative for leg swelling.  ?Gastrointestinal:  Negative for abdominal pain, nausea and vomiting.  ?Genitourinary:  Negative for dysuria and flank pain.  ?Musculoskeletal:  Positive for arthralgias (Bilateral hip pain, left wrist pain).  ?Skin:   ?     Abrasion right lateral chest and bruising of the left wrist  ?Neurological:  Negative for dizziness, seizures, syncope, weakness, numbness and  headaches.  ?Psychiatric/Behavioral:  Negative for confusion.   ?All other systems reviewed and are negative. ? ?Physical Exam ?Updated Vital Signs ?BP 114/75   Pulse 83   Temp 97.9 ?F (36.6 ?C) (Oral)   Resp 20   Ht  (1.778 m)   Wt 59 kg   SpO2 99%   BMI 18.65 kg/m?  ?Physical Exam ?Vitals and nursing note reviewed.  ?Constitutional:   ?   General: He is not in acute distress. ?   Appearance: Normal appearance. He is not ill-appearing.  ?HENT:  ?   Mouth/Throat:  ?   Mouth: Mucous membranes are moist.  ?   Pharynx: Oropharynx is clear.  ?   Comments: Patient has some tenderness to palpation along the right upper  gingiva.  He is edentulous.  There is no fluctuance or tenting of the gums to suggest abscess.  No oral lesions or bleeding of the mouth. ?Eyes:  ?   Extraocular Movements: Extraocular movements intact.  ?   Conjunctiva/sclera: Conjunctivae normal.  ?   Pupils: Pupils are equal, round, and reactive to light.  ?Cardiovascular:  ?   Rate and Rhythm: Normal rate and regular rhythm.  ?   Pulses: Normal pulses.  ?Pulmonary:  ?   Effort: Pulmonary effort is normal.  ?Chest:  ?   Chest wall: Tenderness (Mild tenderness to the lateral right chest wall.  No bony deformity or crepitus noted.  There is a small abrasion to the lateral right chest without edema or ecchymosis) present.  ?Abdominal:  ?   Palpations: Abdomen is soft.  ?   Tenderness: There is no abdominal tenderness.  ?Musculoskeletal:     ?   General: Normal range of motion.  ?   Cervical back: Normal range of motion. No tenderness.  ?   Comments: Tenderness to palpation of the left distal wrist with ecchymosis noted.  No bony deformity or edema noted.  Patient able to perform full range of motion of the fingers of the left hand.  Patient also has tenderness to the lateral aspect of the right hip to palpation.  No external rotation or shortening of the lower extremities.  Able to perform full range of motion of the bilateral hips.  ?Skin: ?   General: Skin is warm.  ?   Capillary Refill: Capillary refill takes less than 2 seconds.  ?   Findings: No rash.  ?Neurological:  ?   General: No focal deficit present.  ?   Mental Status: He is alert.  ?   Sensory: No sensory deficit.  ?   Motor: No weakness.  ?Psychiatric:     ?   Mood and Affect: Mood normal.  ? ? ?ED Results / Procedures / Treatments   ?Labs ?(all labs ordered are listed, but only abnormal results are displayed) ?Labs Reviewed  ?CBC WITH DIFFERENTIAL/PLATELET - Abnormal; Notable for the following components:  ?    Result Value  ? WBC 3.5 (*)   ? RBC 2.93 (*)   ? Hemoglobin 10.5 (*)   ? HCT 31.3 (*)   ?  MCV 106.8 (*)   ? MCH 35.8 (*)   ? Platelets 77 (*)   ? All other components within normal limits  ?BASIC METABOLIC PANEL - Abnormal; Notable for the following components:  ? Creatinine, Ser 1.44 (*)   ? GFR, Estimated 51 (*)   ? All other components within normal limits  ?PROTIME-INR - Abnormal; Notable for the following components:  ?  Prothrombin Time 18.4 (*)   ? INR 1.5 (*)   ? All other components within normal limits  ? ? ?EKG ?None ? ?Radiology ?DG Wrist Complete Left ? ?Result Date: 03/14/2022 ?CLINICAL DATA:  Larey Seat 2 days ago. Bruising and pain of anterior wrist. EXAM: LEFT WRIST - COMPLETE 3+ VIEW COMPARISON:  None FINDINGS: Neutral ulnar variance. Mild-to-moderate thumb carpometacarpal joint space narrowing. No acute fracture or dislocation. 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 Electronically Signed   By: Neita Garnet M.D.   On: 03/14/2022 12:53  ? ?CT Head Wo Contrast ? ?Result Date: 03/14/2022 ?CLINICAL DATA:  Head trauma, minor. Anticoagulated. Fell 2 days ago. EXAM: CT HEAD WITHOUT CONTRAST TECHNIQUE: Contiguous axial images were obtained from the base of the skull through the vertex without intravenous contrast. RADIATION DOSE REDUCTION: This exam was performed according to the departmental dose-optimization program which includes automated exposure control, adjustment of the mA and/or kV according to patient size and/or use of iterative reconstruction technique. COMPARISON:  CT brain 11/21/2021 FINDINGS: Brain: There is mild-to-moderate cortical atrophy, unchanged from prior within normal limits for patient age. The ventricles are normal in configuration. The basilar cisterns are patent. No mass, mass effect, or midline shift. No acute intracranial hemorrhage is seen. No abnormal extra-axial fluid collection. Mild periventricular white matter hypodensities are similar to prior likely chronic ischemic white matter changes. Preservation of the normal cortical gray-white  interface without CT evidence of an acute major vascular territorial cortical based infarction. Vascular: No hyperdense vessel or unexpected calcification. Skull: Status post bifrontal craniotomy. No acute calvarial fr

## 2022-03-14 NOTE — ED Triage Notes (Signed)
Pt reports he tripped and fell at home on Wednesday, c/o pain to right hip and left wrist. Pt states he is on Eliquis and began coughing up blood yesterday. ?

## 2022-04-07 NOTE — Progress Notes (Unsigned)
[place], [place] [address]   CLINIC:  Medical Oncology/Hematology  PCP:  Celene Squibb, MD [address] [place] [address] [phone]   REASON FOR VISIT:  Follow-up for history of bilateral pulmonary embolism and moderate thrombocytopenia   CURRENT THERAPY: Eliquis 5 mg twice daily  INTERVAL HISTORY:  [name] 76 y.o. male returns for routine follow-up of his thrombocytopenia and his history of pulmonary embolism.  He was last seen by Tarri Abernethy PA-C on 11/07/2021.  At today's visit, he reports feeling fair.  No recent hospitalizations, surgeries, or changes in baseline health status. He had an ED visit on 03/14/2022 due to fall at home after tripping on door threshold, fell onto his right side - ED work-up negative for any acute fracture or major bleeding.  Regarding his thrombocytopenia, he has easy bruising but no petechial rash.  He denies any hemoptysis, epistaxis, hematochezia, melena, or hematemesis.   No B symptoms such as fever, chills, night sweats.  Appetite has improved and his weight loss is stable since his last visit.  He is drinking 1 Ensure daily.  He continues to take Eliquis for his history of PE/DVT.  He has not had any major bleeding events. 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   He has chronic dyspnea on exertion, but denies any acute changes in his respiratory status.   No pleuritic chest pain or palpitations. He has fallen once in the past 6 months.  Patient was prescribed vitamin T46 and folic acid supplements at his last visit 4 months ago, but only took them for about 2 months before stopping.  He has 40% energy and 100% appetite. He endorses that he is maintaining a stable weight.   REVIEW OF SYSTEMS:    Review of Systems  Constitutional:  Positive for fatigue. Negative for appetite  change, chills, diaphoresis and fever.  HENT:   Positive for trouble swallowing (no teeth). Negative for lump/mass and nosebleeds.   Eyes:  Negative for eye problems.  Respiratory:  Positive for shortness of breath (on exertion). Negative for cough and hemoptysis.   Cardiovascular:  Negative for chest pain, leg swelling and palpitations.  Gastrointestinal:  Positive for constipation. Negative for abdominal pain, blood in stool, diarrhea, nausea and vomiting.  Genitourinary:  Negative for hematuria.   Skin: Negative.   Neurological:  Positive for dizziness and headaches (migraines). Negative for light-headedness.  Hematological:  Bruises/bleeds easily.  Psychiatric/Behavioral:  The patient is not nervous/anxious.      PAST MEDICAL/SURGICAL HISTORY:  Past Medical History:  Diagnosis Date   Bursitis    l shoulder   Cataract    Elevated bilirubin    H/O: rheumatic fever    Kidney stones    Loss of teeth due to extraction    Migraines    Pulmonary embolism (HCC)    Rheumatic fever    Tendonitis    L shoulder   Past Surgical History:  Procedure Laterality Date   ABDOMINAL SURGERY     CATARACT EXTRACTION, BILATERAL     CHOLECYSTECTOMY     CRANIOTOMY Bilateral 09/06/2018   Procedure: BILATERAL CRANIOTOMIES FOR SUBDURAL HEMATOMA EVACUATION;  Surgeon: Erline Levine, MD;  Location: [facility];  Service: Neurosurgery;  Laterality: Bilateral;   EYE SURGERY       SOCIAL HISTORY:  Social History   Socioeconomic History  Marital status: Widowed    Spouse name: Not on file   Number of children: Not on file   Years of education: Not on file   Highest education level: Not on file  Occupational History   Occupation: retired  Tobacco Use   Smoking status: Every Day    Packs/day: 1.00    Years: 50.00    Pack years: 50.00    Types: Cigarettes   Smokeless tobacco: Never  Vaping Use   Vaping Use: Never used  Substance and Sexual Activity   Alcohol use: Yes    Comment: twice a week    Drug use: Never   Sexual activity: Not Currently  Other Topics Concern   Not on file  Social History Narrative   Not on file   Social Determinants of Health   Financial Resource Strain: Not on file  Food Insecurity: Not on file  Transportation Needs: Not on file  Physical Activity: Not on file  Stress: Not on file  Social Connections: Not on file  Intimate Partner Violence: Not on file    FAMILY HISTORY:  Family History  Problem Relation Age of Onset   Atrial fibrillation Mother    Macular degeneration Mother    Atrial fibrillation Father    Macular degeneration Father    Diabetes Brother    Seizures Brother    Pancreatic cancer Brother    Obesity Brother     CURRENT MEDICATIONS:  Outpatient Encounter Medications as of 04/08/2022  Medication Sig   AIMOVIG 140 MG/ML SOAJ Inject 140 mg into the skin every 30 (thirty) days.   apixaban (ELIQUIS) 5 MG TABS tablet Take 2 tablets by mouth twice a day for 7 days; then start taking 1 tablet by mouth twice a day on daily basis. (Patient taking differently: Take 5 mg by mouth 2 (two) times daily.)   clindamycin (CLEOCIN) 300 MG capsule Take 1 capsule (300 mg total) by mouth 3 (three) times daily.   cyclobenzaprine (FLEXERIL) 10 MG tablet Take 10 mg by mouth every 8 (eight) hours as needed.   folic acid (FOLVITE) 643 MCG tablet Take 1 tablet (400 mcg total) by mouth daily.   HYDROcodone-acetaminophen (NORCO/VICODIN) 5-325 MG tablet Take 1 tablet by mouth every 6 (six) hours as needed.   HYDROcodone-acetaminophen (NORCO/VICODIN) 5-325 MG tablet Take one half to one tab po q 6 hrs prn pain   ondansetron (ZOFRAN) 4 MG tablet Take 4 mg by mouth every 6 (six) hours as needed for nausea or vomiting.   vitamin B-12 (CYANOCOBALAMIN) 500 MCG tablet Take 1 tablet (500 mcg total) by mouth daily.   No facility-administered encounter medications on file as of 04/08/2022.    ALLERGIES:  Allergies  Allergen Reactions   Floxacillin (Flucloxacillin)  Anaphylaxis     PHYSICAL EXAM:  ECOG PERFORMANCE STATUS: 1 - Symptomatic but completely ambulatory    There were no vitals filed for this visit. There were no vitals filed for this visit. Physical Exam Constitutional:      Appearance: Normal appearance.     Comments: Patient appears slightly cachectic with temporal muscle wasting and folds of loose skin indicating recent weight loss.  HENT:     Head: Normocephalic and atraumatic.     Mouth/Throat:     Mouth: Mucous membranes are moist.  Eyes:     Extraocular Movements: Extraocular movements intact.     Pupils: Pupils are equal, round, and reactive to light.  Cardiovascular:     Rate and Rhythm: Normal rate  and regular rhythm.     Pulses: Normal pulses.     Heart sounds: Normal heart sounds.  Pulmonary:     Effort: Pulmonary effort is normal.     Breath sounds: Normal breath sounds. Decreased air movement present.  Abdominal:     General: Bowel sounds are normal.     Palpations: Abdomen is soft.     Tenderness: There is no abdominal tenderness.  Musculoskeletal:        General: No swelling.     Right lower leg: No edema.     Left lower leg: No edema.  Lymphadenopathy:     Cervical: No cervical adenopathy.  Skin:    General: Skin is warm and dry.  Neurological:     General: No focal deficit present.     Mental Status: He is alert and oriented to person, place, and time.  Psychiatric:        Mood and Affect: Mood normal.        Behavior: Behavior normal.     LABORATORY DATA:  I have reviewed the labs as listed.  CBC    Component Value Date/Time   WBC 3.5 (L) 03/14/2022 1226   RBC 2.93 (L) 03/14/2022 1226   HGB 10.5 (L) 03/14/2022 1226   HCT 31.3 (L) 03/14/2022 1226   PLT 77 (L) 03/14/2022 1226   MCV 106.8 (H) 03/14/2022 1226   MCH 35.8 (H) 03/14/2022 1226   MCHC 33.5 03/14/2022 1226   RDW 14.4 03/14/2022 1226   LYMPHSABS 0.9 03/14/2022 1226   MONOABS 0.4 03/14/2022 1226   EOSABS 0.1 03/14/2022 1226    BASOSABS 0.0 03/14/2022 1226      Latest Ref Rng & Units 03/14/2022   12:26 PM 11/23/2021    4:01 AM 11/22/2021    4:23 AM  CMP  Glucose 70 - 99 mg/dL 91   84   86    BUN 8 - 23 mg/dL 15   12   17     Creatinine 0.61 - 1.24 mg/dL 1.44   1.12   1.25    Sodium 135 - 145 mmol/L 137   138   141    Potassium 3.5 - 5.1 mmol/L 3.7   3.5   3.6    Chloride 98 - 111 mmol/L 105   108   106    CO2 22 - 32 mmol/L 26   26   27     Calcium 8.9 - 10.3 mg/dL 8.9   8.8   9.1    Total Protein 6.5 - 8.1 g/dL  5.1   6.0    Total Bilirubin 0.3 - 1.2 mg/dL  3.4   5.0    Alkaline Phos 38 - 126 U/L  174   205    AST 15 - 41 U/L  112   379    ALT 0 - 44 U/L  81   147      DIAGNOSTIC IMAGING:  I have independently reviewed the relevant imaging and discussed with the patient.  ASSESSMENT & PLAN: 1.  Pancytopenia with macrocytic anemia - Moderate thrombocytopenia and macrocytic anemia since November 2019.  Development of mild leukopenia since January 2023. - No known history of liver disease, CTAP on 09/19/2019 showed normal spleen and liver. - TSH normal (10/31/2021).  SPEP and hepatitis panel negative. - Most recent CBC (03/14/2022): Hgb 10.5/MCV 106.8, WBC 3.5 with normal differential, platelets 77.  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 - Nutritional panel (03/06/2022): Normal B12 (511) and  folate (9.4).  Homocystine elevated (29.0), but down from previous (51.1).  Methylmalonic acid remains elevated at 548.  Normal ferritin 143 with iron saturation 30%. - Admits to easy bruising, but denies any major bleeding episodes or petechial rash.  No bright red blood per rectum or melena.  No B symptoms.    - Patient was started on vitamin Z61 and folic acid supplements since January 2023, but has not been taking them as prescribed. - Differential diagnosis includes early myelodysplasia versus nutritional deficiency.  May have some anemia of CKD, immune mediated thrombocytopenia or neutropenia as well.    - PLAN: Due  to persistently elevated MMA and homocystine, patient instructed to restart his vitamin B12 500 mcg daily and folate 400 mcg daily. - Recommend continuing Eliquis (see below), but if platelets drop below 50, we will reassess risk/benefits. - Repeat labs (CBC, B12, folate, methylmalonic acid, homocystine) and RTC in 4 months. - Moderate suspicion for MDS.  If any significant derangements, would consider bone marrow biopsy.  (Discussed possible bone marrow biopsy with patient, but he is hesitant - reports that he had a bone marrow biopsy in the 1970s, but he does not recall the reason or the results.  Apparently he had some back pain after this and is hesitant to get another biopsy.  He wants to hold off on bone marrow biopsy until it is "absolutely and unavoidably necessary.") - If significant drops in hemoglobin, could consider starting Retacrit for clinical diagnosis of MDS.   2.  Bilateral pulmonary embolisms/right leg DVT, recurrent, unprovoked - Hospital admission from 10/15/2019 through 10/17/2019 with hemoptysis. - CT chest on 10/16/2019 showed acute bilateral pulmonary emboli greater within the left lower lobe with no evidence of right heart strain.  Dopplers showed no evidence of DVT. - Eliquis was apparently discontinued in October 2021. - CT angio on 09/30/2020 shows acute PE in segmental and subsegmental branches supplying the right lower, right middle and left lower and left upper lobes. - He was found to have right leg DVT in November 2021 - " extensive right lower extremity DVT extending throughout femoral vein and deep femoral vein, through popliteal vein and into the calf" - Patient was started back on Eliquis in November 2021 after diagnosis of recurrent PE/DVT - He remains on Eliquis 5 mg twice daily, which he is tolerating well - No major bleeding episodes such as bright blood per rectum or melena.   - ED visit for fall on 03/14/2022, reports that he has fallen only once in the past  6 months. - Patient does have some post thrombotic changes of right lower extremity (intermittent right lower extremity edema and right ankle varicose veins), but does not have any acute signs or symptoms concerning for recurrent DVT or PE at this time  - PLAN: Continue indefinite Eliquis.  No bleeding issues reported. - We discussed increased risk of  fall-associated bleeding including hemorrhagic CVA.  Discussed fall safety and prevention at home.  Patient wishes to continue Eliquis at this time, which is reasonable considering his high risk of recurrent VTE if he were to stop anticoagulation.  3.  Weight loss, RESOLVED - Patient had previously lost 15-20 pounds since December 2021, secondary to poor appetite - Appetite improved, weight stable since January 2023 - LDCT chest (October 2022) did not show any signs of active malignancy - CT scans of head, chest, and pelvis from ED visit on 03/14/2022 negative for any signs of malignancy.     4.  Tobacco abuse - He has been smoking 1 PPD x50 years, continues to smoke 1 pack/day cigarettes - LDCT scan of chest (08/09/2021): Lung RADS 2, benign appearance or behavior; recommended repeat LDCT chest in 12 months. - PLAN: We will schedule patient for repeat LDCT scan of chest in October 2023.   PLAN SUMMARY & DISPOSITION:  Labs in 4 months RTC the week after labs  All questions were answered. The patient knows to call the clinic with any problems, questions or concerns.  Medical decision making: Moderate  Time spent on visit: I spent 25 minutes counseling the patient face to face. The total time spent in the appointment was 40 minutes and more than 50% was on counseling.   Harriett Rush, PA-C  04/08/2022 7:21 PM

## 2022-04-08 ENCOUNTER — Inpatient Hospital Stay (HOSPITAL_COMMUNITY): Payer: Medicare Other | Attending: Hematology | Admitting: Physician Assistant

## 2022-04-08 VITALS — BP 132/96 | HR 101 | Temp 98.1°F | Resp 18 | Ht 70.0 in | Wt 130.0 lb

## 2022-04-08 DIAGNOSIS — D696 Thrombocytopenia, unspecified: Secondary | ICD-10-CM | POA: Diagnosis present

## 2022-04-08 DIAGNOSIS — E538 Deficiency of other specified B group vitamins: Secondary | ICD-10-CM

## 2022-04-08 DIAGNOSIS — Z7901 Long term (current) use of anticoagulants: Secondary | ICD-10-CM | POA: Insufficient documentation

## 2022-04-08 DIAGNOSIS — D539 Nutritional anemia, unspecified: Secondary | ICD-10-CM

## 2022-04-08 DIAGNOSIS — Z86711 Personal history of pulmonary embolism: Secondary | ICD-10-CM | POA: Diagnosis not present

## 2022-04-08 DIAGNOSIS — I2694 Multiple subsegmental pulmonary emboli without acute cor pulmonale: Secondary | ICD-10-CM

## 2022-04-08 DIAGNOSIS — D61818 Other pancytopenia: Secondary | ICD-10-CM | POA: Diagnosis not present

## 2022-04-08 NOTE — Patient Instructions (Signed)
[place] at [facility] Discharge Instructions  You were seen today by Tarri Abernethy PA-C for your low blood cells and your history of blood clots in your legs/lungs.  Your platelets are low, but stable at their usual level.  Most recent platelets were 77.  Your white blood cells and red blood cells are also low.  This may be caused by a type of bone marrow cancer called "MDS" or myelodysplastic syndrome.  If your counts continue to drop, we will need to check a bone marrow biopsy.  Your labs showed some borderline deficiency in vitamin B12 and folate.  You should continue your T02 and folic acid supplement.  Continue Eliquis indefinitely for prevention of recurrent blood clots.  Seek immediate medical attention if you experience any signs of major bleeding events.  Be careful not to fall at home!  Your weight looks better.  Continue to drink at least 1 Ensure beverage per day.    LABS: Return in 4 months for repeat labs  MEDICATIONS: - Continue Eliquis - Continue vitamin B12 (cyanocobalamin) 500 mcg daily - Continue folic acid 409 mcg daily  FOLLOW-UP APPOINTMENT: Office visit in 4 months, the week after labs   Thank you for choosing [place] at [facility] to provide your oncology and hematology care.  To afford each patient quality time with our provider, please arrive at least 15 minutes before your scheduled appointment time.   If you have a lab appointment with the [facility] please come in thru the Main Entrance and check in at the main information desk.  You need to re-schedule your appointment should you arrive 10 or more minutes late.  We strive to give you quality time with our providers, and arriving late affects you and other patients whose appointments are after yours.  Also, if you no show three or more times for appointments you may be dismissed from the clinic at the providers discretion.     Again, thank you for  choosing [facility].  Our hope is that these requests will decrease the amount of time that you wait before being seen by our physicians.       _____________________________________________________________  Should you have questions after your visit to [facility], please contact our office at [phone] and follow the prompts.  Our office hours are 8:00 a.m. and 4:30 p.m. Monday - Friday.  Please note that voicemails left after 4:00 p.m. may not be returned until the following business day.  We are closed weekends and major holidays.  You do have access to a nurse 24-7, just call the main number to the clinic [phone] and do not press any options, hold on the line and a nurse will answer the phone.    For prescription refill requests, have your pharmacy contact our office and allow 72 hours.    Due to Covid, you will need to wear a mask upon entering the hospital. If you do not have a mask, a mask will be given to you at the Main Entrance upon arrival. For doctor visits, patients may have 1 support person age 76 or older with them. For treatment visits, patients can not have anyone with them due to social distancing guidelines and our immunocompromised population.

## 2022-05-28 IMAGING — DX DG CHEST 2V
2 series · 2 of 2 positions shown · non-contrast
Comparison: 04/02/2007

CLINICAL DATA: Hemoptysis.  Short of breath

EXAM:
CHEST - 2 VIEW

[chest pa]
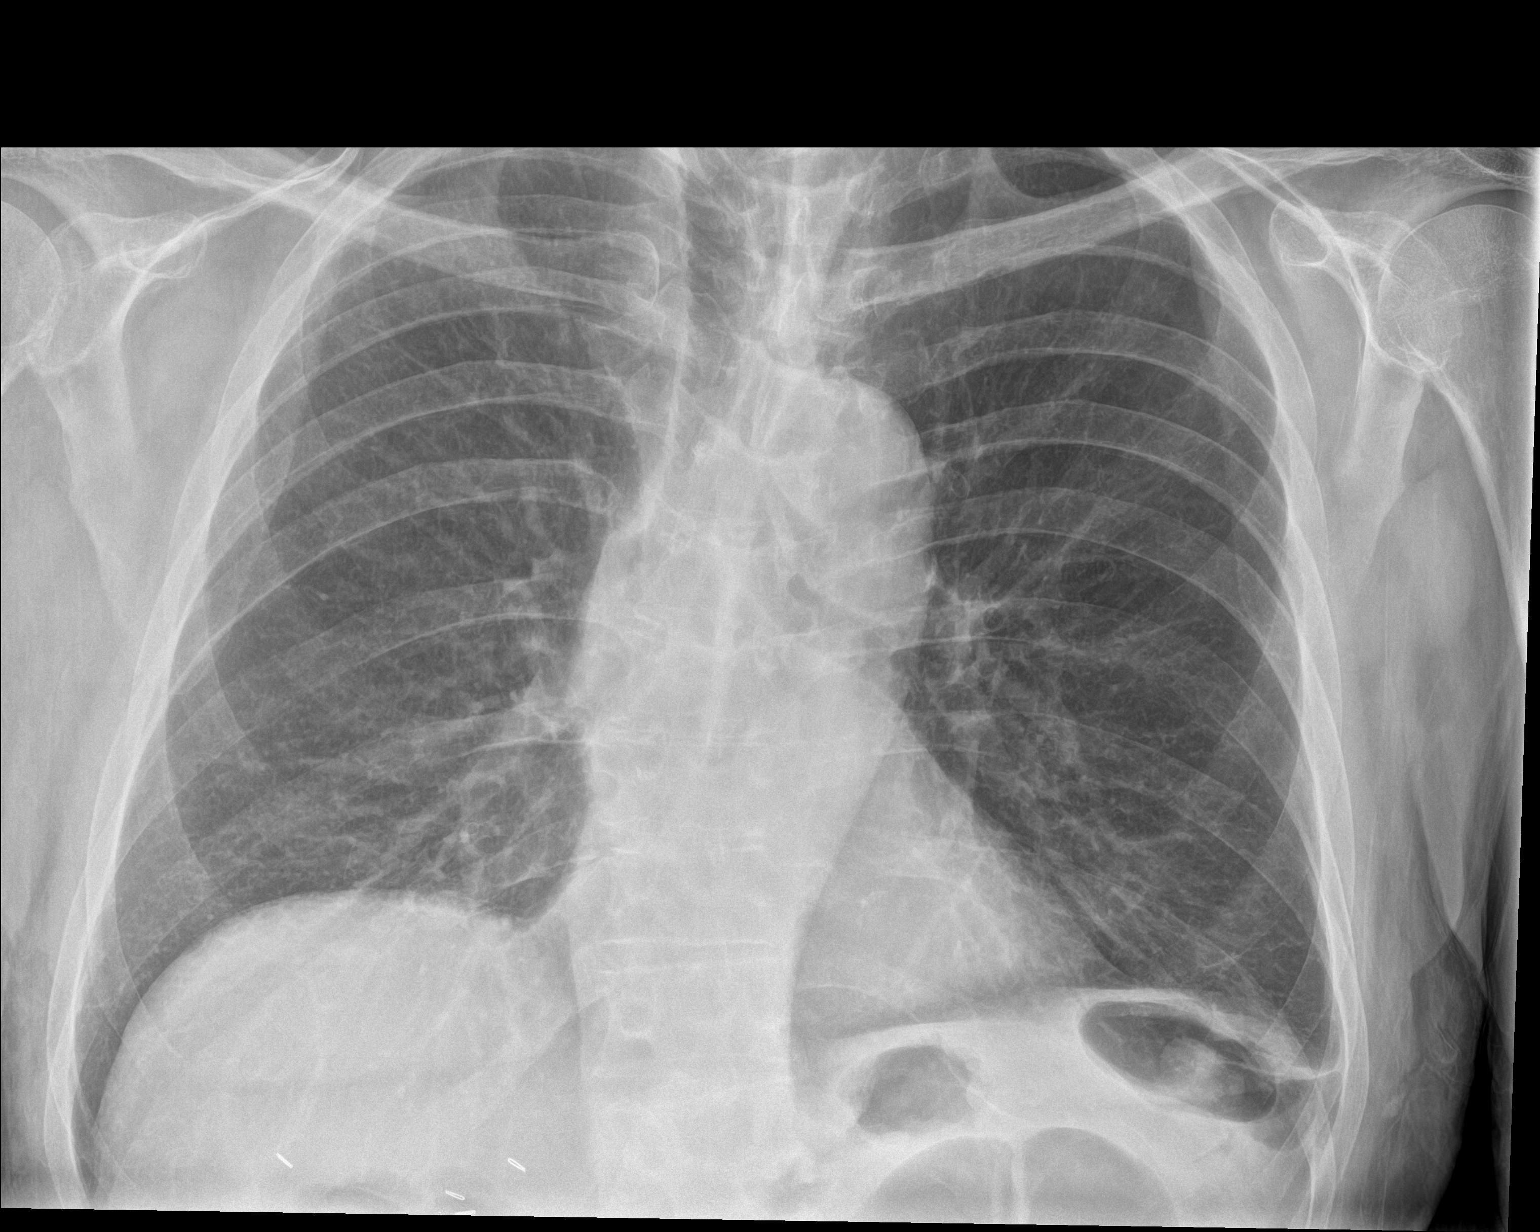

[chest lat]
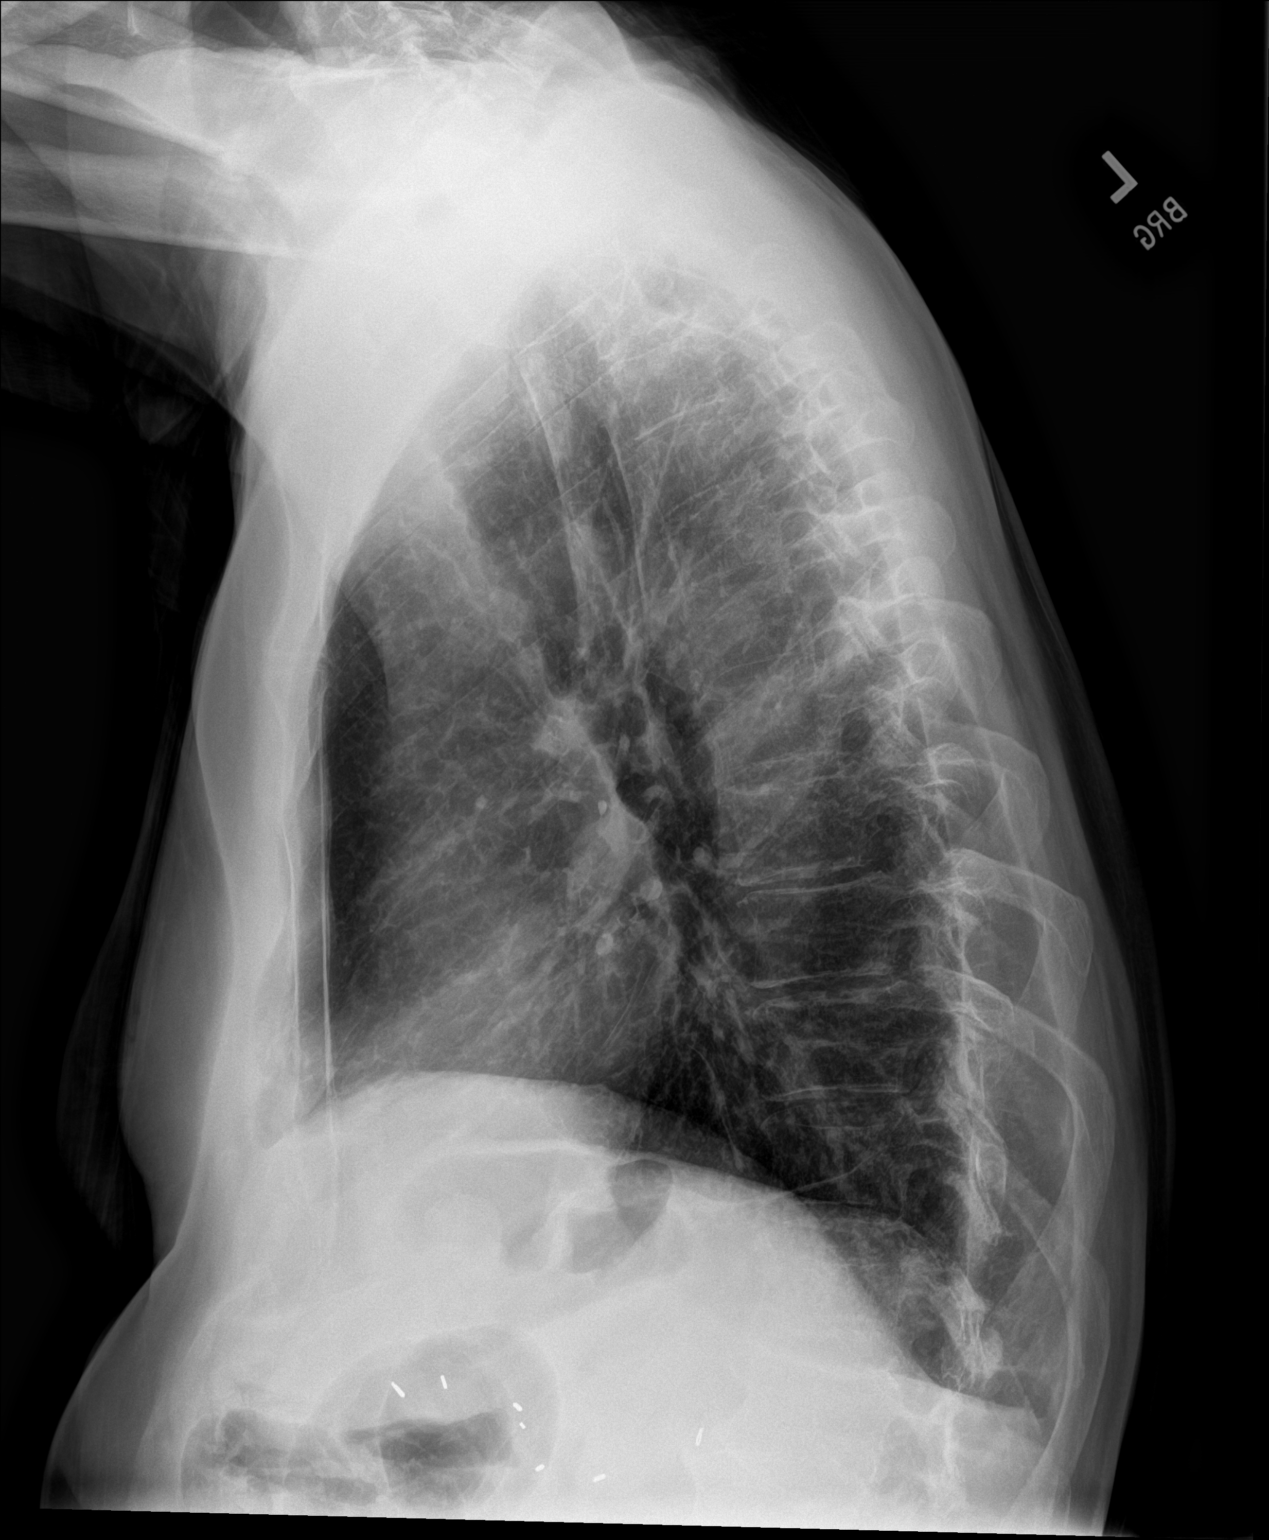

[2 of 2 positions shown; findings below may reference images not displayed]

FINDINGS: Cardiac and mediastinal contours normal. Pulmonary vascularity
normal.

Negative for pneumonia. No infiltrate or effusion. Mild linear
markings left costophrenic angle likely scarring. Mild apical
scarring on the left.
IMPRESSION: No acute abnormality.  Mild scarring left costophrenic angle.

CT may be helpful if hemoptysis persists.

## 2022-08-14 ENCOUNTER — Inpatient Hospital Stay: Payer: Medicare Other | Attending: Physician Assistant

## 2022-08-14 ENCOUNTER — Ambulatory Visit (HOSPITAL_COMMUNITY)
Admission: RE | Admit: 2022-08-14 | Discharge: 2022-08-14 | Disposition: A | Discharge status: Home / Self Care | Payer: Medicare Other | Source: Ambulatory Visit | Attending: Physician Assistant | Admitting: Physician Assistant

## 2022-08-14 DIAGNOSIS — I2510 Atherosclerotic heart disease of native coronary artery without angina pectoris: Secondary | ICD-10-CM | POA: Insufficient documentation

## 2022-08-14 DIAGNOSIS — D696 Thrombocytopenia, unspecified: Secondary | ICD-10-CM | POA: Diagnosis not present

## 2022-08-14 DIAGNOSIS — I700 Atherosclerosis of aorta: Secondary | ICD-10-CM | POA: Insufficient documentation

## 2022-08-14 DIAGNOSIS — Z122 Encounter for screening for malignant neoplasm of respiratory organs: Secondary | ICD-10-CM | POA: Insufficient documentation

## 2022-08-14 DIAGNOSIS — D61818 Other pancytopenia: Secondary | ICD-10-CM

## 2022-08-14 DIAGNOSIS — D649 Anemia, unspecified: Secondary | ICD-10-CM | POA: Diagnosis not present

## 2022-08-14 DIAGNOSIS — E538 Deficiency of other specified B group vitamins: Secondary | ICD-10-CM | POA: Diagnosis not present

## 2022-08-14 DIAGNOSIS — F17210 Nicotine dependence, cigarettes, uncomplicated: Secondary | ICD-10-CM | POA: Insufficient documentation

## 2022-08-14 DIAGNOSIS — Z87891 Personal history of nicotine dependence: Secondary | ICD-10-CM | POA: Insufficient documentation

## 2022-08-14 DIAGNOSIS — J432 Centrilobular emphysema: Secondary | ICD-10-CM | POA: Diagnosis not present

## 2022-08-14 LAB — COMPREHENSIVE METABOLIC PANEL
ALT: 34 U/L (ref 0–44)
AST: 42 U/L — ABNORMAL HIGH (ref 15–41)
Albumin: 3.5 g/dL (ref 3.5–5.0)
Alkaline Phosphatase: 101 U/L (ref 38–126)
Anion gap: 5 (ref 5–15)
BUN: 11 mg/dL (ref 8–23)
CO2: 27 mmol/L (ref 22–32)
Calcium: 8.5 mg/dL — ABNORMAL LOW (ref 8.9–10.3)
Chloride: 110 mmol/L (ref 98–111)
Creatinine, Ser: 1.22 mg/dL (ref 0.61–1.24)
GFR, Estimated: >60 mL/min (ref >60)
Glucose, Bld: 77 mg/dL (ref 70–99)
Potassium: 3.6 mmol/L (ref 3.5–5.1)
Sodium: 142 mmol/L (ref 135–145)
Total Bilirubin: 1.4 mg/dL — ABNORMAL HIGH (ref 0.3–1.2)
Total Protein: 5.8 g/dL — ABNORMAL LOW (ref 6.5–8.1)

## 2022-08-14 LAB — FERRITIN: Ferritin: 92 ng/mL (ref 24–336)

## 2022-08-14 LAB — CBC WITH DIFFERENTIAL/PLATELET
Abs Immature Granulocytes: 0.01 10*3/uL (ref 0.00–0.07)
Basophils Absolute: 0 10*3/uL (ref 0.0–0.1)
Basophils Relative: 1 %
Eosinophils Absolute: 0.1 10*3/uL (ref 0.0–0.5)
Eosinophils Relative: 2 %
HCT: 31.5 % — ABNORMAL LOW (ref 39.0–52.0)
Hemoglobin: 10.4 g/dL — ABNORMAL LOW (ref 13.0–17.0)
Immature Granulocytes: 0 %
Lymphocytes Relative: 36 %
Lymphs Abs: 1 10*3/uL (ref 0.7–4.0)
MCH: 35.1 pg — ABNORMAL HIGH (ref 26.0–34.0)
MCHC: 33 g/dL (ref 30.0–36.0)
MCV: 106.4 fL — ABNORMAL HIGH (ref 80.0–100.0)
Monocytes Absolute: 0.4 10*3/uL (ref 0.1–1.0)
Monocytes Relative: 13 %
Neutro Abs: 1.4 10*3/uL — ABNORMAL LOW (ref 1.7–7.7)
Neutrophils Relative %: 48 %
Platelets: 75 10*3/uL — ABNORMAL LOW (ref 150–400)
RBC: 2.96 MIL/uL — ABNORMAL LOW (ref 4.22–5.81)
RDW: 14.6 % (ref 11.5–15.5)
WBC: 2.9 10*3/uL — ABNORMAL LOW (ref 4.0–10.5)
nRBC: 0 % (ref 0.0–0.2)

## 2022-08-14 LAB — IRON AND TIBC
Iron: 98 ug/dL (ref 45–182)
Saturation Ratios: 30 % (ref 17.9–39.5)
TIBC: 331 ug/dL (ref 250–450)
UIBC: 233 ug/dL

## 2022-08-14 LAB — FOLATE: Folate: 24.7 ng/mL (ref >5.9)

## 2022-08-14 LAB — LACTATE DEHYDROGENASE: LDH: 121 U/L (ref 98–192)

## 2022-08-14 LAB — VITAMIN B12: Vitamin B-12: 682 pg/mL (ref 180–914)

## 2022-08-16 LAB — HOMOCYSTEINE: Homocysteine: 20.2 umol/L — ABNORMAL HIGH (ref 0.0–19.2)

## 2022-08-17 LAB — METHYLMALONIC ACID, SERUM: Methylmalonic Acid, Quantitative: 404 nmol/L — ABNORMAL HIGH (ref 0–378)

## 2022-08-18 NOTE — Progress Notes (Signed)
Malachy Mood with [facility] called with report on patients CT lung screening. Results printed and placed on Tarri Abernethy, PA-C desk for review.

## 2022-08-21 ENCOUNTER — Ambulatory Visit: Payer: Medicare Other | Admitting: Physician Assistant

## 2022-09-03 NOTE — Progress Notes (Unsigned)
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 male returns for routine follow-up of his thrombocytopenia and his history of pulmonary embolism.  He was last seen by Tarri Abernethy PA-C on 04/08/2022.  At today's visit, he reports feeling fair.   No recent hospitalizations, surgeries, or changes in baseline health status.  Regarding his thrombocytopenia, he has easy bruising but no petechial rash.  He denies any hemoptysis, epistaxis, hematochezia, melena, or hematemesis.   No B symptoms such as fever, chills, night sweats.  Appetite has improved and his weight is slightly improved since his last visit.  He is drinking 1 Ensure daily.  He continues to take Eliquis for his history of PE/DVT.  He has not had any major bleeding events. 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  He has chronic dyspnea on exertion, but denies any acute changes in his respiratory status.  No pleuritic chest pain or palpitations.  He has fallen once in the past year.  Patient was prescribed vitamin T03 546 mcg and folic acid 568 mcg supplements at his visit 4 months ago.  He has 25% energy and 75% appetite. He endorses that he is maintaining a stable weight.   REVIEW OF SYSTEMS:    Review of Systems  Constitutional:  Positive for fatigue. Negative for appetite change, chills, diaphoresis and fever.  HENT:   Positive for trouble swallowing (no teeth). Negative for lump/mass and nosebleeds.   Eyes:  Negative for eye problems.  Respiratory:  Positive for shortness of breath (on exertion). Negative  for cough and hemoptysis.   Cardiovascular:  Negative for chest pain, leg swelling and palpitations.  Gastrointestinal:  Positive for constipation. Negative for abdominal pain, blood in stool, diarrhea, nausea and vomiting.  Genitourinary:  Positive for dysuria (Occasional). Negative for hematuria.   Skin: Negative.   Neurological:  Positive for dizziness and headaches (migraines). Negative for light-headedness.  Hematological:  Bruises/bleeds easily.  Psychiatric/Behavioral:  Positive for sleep disturbance. The patient is not nervous/anxious.       PAST MEDICAL/SURGICAL HISTORY:  Past Medical History:  Diagnosis Date   Bursitis    l shoulder   Cataract    Elevated bilirubin    H/O: rheumatic fever    Kidney stones    Loss of teeth due to extraction    Migraines    Pulmonary embolism (HCC)    Rheumatic fever    Tendonitis    L shoulder   Past Surgical History:  Procedure Laterality Date   ABDOMINAL SURGERY     CATARACT EXTRACTION, BILATERAL     CHOLECYSTECTOMY     CRANIOTOMY Bilateral 09/06/2018   Procedure: BILATERAL CRANIOTOMIES FOR SUBDURAL HEMATOMA EVACUATION;  Surgeon: Erline Levine, MD;  Location: [facility];  Service: Neurosurgery;  Laterality: Bilateral;   EYE SURGERY       SOCIAL HISTORY:  Social History   Socioeconomic History   Marital status: Widowed    Spouse name: Not on file   Number of children: Not on file   Years of education: Not on file   Highest education level: Not  on file  Occupational History   Occupation: retired  Tobacco Use   Smoking status: Every Day    Packs/day: 1.00    Years: 50.00    Total pack years: 50.00    Types: Cigarettes   Smokeless tobacco: Never  Vaping Use   Vaping Use: Never used  Substance and Sexual Activity   Alcohol use: Yes    Comment: twice a week   Drug use: Never   Sexual activity: Not Currently  Other Topics Concern   Not on file  Social History Narrative   Not on file   Social Determinants of Health    Financial Resource Strain: Not on file  Food Insecurity: Not on file  Transportation Needs: Not on file  Physical Activity: Not on file  Stress: Not on file  Social Connections: Not on file  Intimate Partner Violence: Not on file    FAMILY HISTORY:  Family History  Problem Relation Age of Onset   Atrial fibrillation Mother    Macular degeneration Mother    Atrial fibrillation Father    Macular degeneration Father    Diabetes Brother    Seizures Brother    Pancreatic cancer Brother    Obesity Brother     CURRENT MEDICATIONS:  Outpatient Encounter Medications as of 09/04/2022  Medication Sig   AIMOVIG 140 MG/ML SOAJ Inject 140 mg into the skin every 30 (thirty) days.   albuterol (VENTOLIN HFA) 108 (90 Base) MCG/ACT inhaler Inhale 2 puffs into the lungs every 4 (four) hours.   apixaban (ELIQUIS) 5 MG TABS tablet Take 2 tablets by mouth twice a day for 7 days; then start taking 1 tablet by mouth twice a day on daily basis. (Patient taking differently: Take 5 mg by mouth 2 (two) times daily.)   clindamycin (CLEOCIN) 300 MG capsule Take 1 capsule (300 mg total) by mouth 3 (three) times daily.   cyclobenzaprine (FLEXERIL) 10 MG tablet Take 10 mg by mouth every 8 (eight) hours as needed.   folic acid (FOLVITE) 176 MCG tablet Take 1 tablet (400 mcg total) by mouth daily.   HYDROcodone-acetaminophen (NORCO/VICODIN) 5-325 MG tablet Take 1 tablet by mouth every 6 (six) hours as needed.   HYDROcodone-acetaminophen (NORCO/VICODIN) 5-325 MG tablet Take one half to one tab po q 6 hrs prn pain   ondansetron (ZOFRAN) 4 MG tablet Take 4 mg by mouth every 6 (six) hours as needed for nausea or vomiting.   vitamin B-12 (CYANOCOBALAMIN) 500 MCG tablet Take 1 tablet (500 mcg total) by mouth daily.   No facility-administered encounter medications on file as of 09/04/2022.    ALLERGIES:  Allergies  Allergen Reactions   Floxacillin (Flucloxacillin) Anaphylaxis     PHYSICAL EXAM:  ECOG  PERFORMANCE STATUS: 1 - Symptomatic but completely ambulatory    There were no vitals filed for this visit. There were no vitals filed for this visit. Physical Exam Constitutional:      Appearance: Normal appearance.     Comments: Patient appears slightly cachectic with temporal muscle wasting and folds of loose skin indicating recent weight loss.  HENT:     Head: Normocephalic and atraumatic.     Mouth/Throat:     Mouth: Mucous membranes are moist.  Eyes:     Extraocular Movements: Extraocular movements intact.     Pupils: Pupils are equal, round, and reactive to light.  Cardiovascular:     Rate and Rhythm: Normal rate and regular rhythm.     Pulses: Normal pulses.  Heart sounds: Normal heart sounds.  Pulmonary:     Effort: Pulmonary effort is normal.     Breath sounds: Normal breath sounds. Decreased air movement present.  Abdominal:     General: Bowel sounds are normal.     Palpations: Abdomen is soft.     Tenderness: There is no abdominal tenderness.  Musculoskeletal:        General: No swelling.     Right lower leg: No edema.     Left lower leg: No edema.  Lymphadenopathy:     Cervical: No cervical adenopathy.  Skin:    General: Skin is warm and dry.  Neurological:     General: No focal deficit present.     Mental Status: He is alert and oriented to person, place, and time.  Psychiatric:        Mood and Affect: Mood normal.        Behavior: Behavior normal.     LABORATORY DATA:  I have reviewed the labs as listed.  CBC    Component Value Date/Time   WBC 2.9 (L) 08/14/2022 1252   RBC 2.96 (L) 08/14/2022 1252   HGB 10.4 (L) 08/14/2022 1252   HCT 31.5 (L) 08/14/2022 1252   PLT 75 (L) 08/14/2022 1252   MCV 106.4 (H) 08/14/2022 1252   MCH 35.1 (H) 08/14/2022 1252   MCHC 33.0 08/14/2022 1252   RDW 14.6 08/14/2022 1252   LYMPHSABS 1.0 08/14/2022 1252   MONOABS 0.4 08/14/2022 1252   EOSABS 0.1 08/14/2022 1252   BASOSABS 0.0 08/14/2022 1252      Latest  Ref Rng & Units 08/14/2022   12:52 PM 03/14/2022   12:26 PM 11/23/2021    4:01 AM  CMP  Glucose 70 - 99 mg/dL 77  91  84   BUN 8 - 23 mg/dL _0 Creatinine 0.61 - 1.24 mg/dL 1.22  1.44  1.12   Sodium 135 - 145 mmol/L 142  137  138   Potassium 3.5 - 5.1 mmol/L 3.6  3.7  3.5   Chloride 98 - 111 mmol/L 110  105  108   CO2 22 - 32 mmol/L _1 Calcium 8.9 - 10.3 mg/dL 8.5  8.9  8.8   Total Protein 6.5 - 8.1 g/dL 5.8   5.1   Total Bilirubin 0.3 - 1.2 mg/dL 1.4   3.4   Alkaline Phos 38 - 126 U/L 101   174   AST 15 - 41 U/L 42   112   ALT 0 - 44 U/L 34   81     DIAGNOSTIC IMAGING:  I have independently reviewed the relevant imaging and discussed with the patient.  ASSESSMENT & PLAN: 1.  Pancytopenia with macrocytic anemia - Moderate thrombocytopenia and macrocytic anemia since November 2019.  Development of mild leukopenia since January 2023. - No known history of liver disease, CTAP on 09/19/2019 showed normal spleen and liver. - TSH normal (10/31/2021).  SPEP and hepatitis panel negative. - Nutritional panel (03/06/2022): Normal B12 (511) and folate (9.4).  Homocystine elevated (29.0), but down from previous (51.1).  Methylmalonic acid remains elevated at 548.  Normal ferritin 143 with iron saturation 30%. - Most recent CBC (08/14/2022) shows persistent pancytopenia with WBC 2.9/ANC 1.4, Hgb 10.4, platelets 75.  CMP with creatinine 1.22/GFR 60.  Normal LDH. - Labs (08/14/2022) show improved folate (24.7) but persistently elevated homocystine (20.2).  B12 improved at 682, MMA remains elevated, but  trending downward at 404.  Ferritin 92, iron saturation 30%. - Admits to easy bruising, but denies any major bleeding episodes or petechial rash.  No bright red blood per rectum or melena.  No B symptoms. - Patient has been taking vitamin Z61 and folic acid supplements since June 2023 - Differential diagnosis favors early myelodysplasia.  May have some effects of nutritional deficiency  and anemia of CKD.  Less likely to be immune mediated thrombocytopenia or neutropenia.    - PLAN: Persistent but relatively stable pancytopenia. - Continue vitamin B12 500 mcg daily.  Continue folate 400 mcg daily. - Recommend continuing Eliquis (see below), but if platelets drop below 50, we will reassess risk/benefits. - Repeat labs (CBC, B12, folate, methylmalonic acid, homocystine) and RTC in 3 months.  - Moderate suspicion for MDS.  Strongly recommended bone marrow biopsy to patient, but he refuses at this time - reports that he had a bone marrow biopsy in the 1970s, but he does not recall the reason or the results.  Apparently he had some back pain after this and is hesitant to get another biopsy.  Patient wants to hold off on bone marrow biopsy until it is "absolutely and unavoidably necessary.")   2.  Bilateral pulmonary embolisms/right leg DVT, recurrent, unprovoked - Hospital admission from 10/15/2019 through 10/17/2019 with hemoptysis. - CT chest on 10/16/2019 showed acute bilateral pulmonary emboli greater within the left lower lobe with no evidence of right heart strain.  Dopplers showed no evidence of DVT. - Eliquis was apparently discontinued in October 2021. - CT angio on 09/30/2020 shows acute PE in segmental and subsegmental branches supplying the right lower, right middle and left lower and left upper lobes. - He was found to have right leg DVT in November 2021 - " extensive right lower extremity DVT extending throughout femoral vein and deep femoral vein, through popliteal vein and into the calf" - Patient was started back on Eliquis in November 2021 after diagnosis of recurrent PE/DVT - He remains on Eliquis 5 mg twice daily, which he is tolerating well - No major bleeding episodes such as bright blood per rectum or melena. - ED visit for fall on 03/14/2022, reports that he has fallen only once in the past year - Patient does have some post thrombotic changes of right lower  extremity (intermittent right lower extremity edema and right ankle varicose veins), but does not have any acute signs or symptoms concerning for recurrent DVT or PE at this time - PLAN: Continue indefinite Eliquis.  No bleeding issues reported. - We discussed increased risk of  fall-associated bleeding including hemorrhagic CVA.  Discussed fall safety and prevention at home.  Patient wishes to continue Eliquis at this time, which is reasonable considering his high risk of recurrent VTE if he were to stop anticoagulation.  3.  Weight loss, RESOLVED - Patient had previously lost 15-20 pounds since December 2021, secondary to poor appetite - Appetite improved, weight improved since January 2023 - LDCT chest (October 2023) did not show any signs of active malignancy - CT scans of head, chest, and pelvis from ED visit on 03/14/2022 negative for any signs of malignancy.     4.  Tobacco abuse - He has been smoking 1 PPD x50 years, continues to smoke 1 pack/day cigarettes - LDCT scan of chest (08/14/2022): Lung RADS 2S, benign appearance or behavior; recommended repeat LDCT chest in 12 months.  INCIDENTAL FINDINGS discussed with patient as below. Aortic atherosclerosis and LAD coronary artery disease.  Findings consistent with COPD Probable left hydronephrosis, incompletely imaged.  (Note that mild left hydronephrosis was also seen on abdominal US 11/22/2021) - patient denies any urinary obstructive symptoms  - PLAN: We will schedule patient for repeat LDCT scan of chest in October 2024. - Recommend PCP follow-up for the above incidental findings    PLAN SUMMARY & DISPOSITION:  Labs in 3 months  RTC the week after labs  All questions were answered. The patient knows to call the clinic with any problems, questions or concerns.  Medical decision making: Moderate   Time spent on visit: I spent 20 minutes counseling the patient face to face. The total time spent in the appointment was 30 minutes and  more than 50% was on counseling.   Harriett Rush, PA-C  09/04/2022 1:50 PM

## 2022-09-04 ENCOUNTER — Inpatient Hospital Stay: Payer: Medicare Other | Attending: Physician Assistant | Admitting: Physician Assistant

## 2022-09-04 VITALS — BP 138/89 | HR 91 | Temp 98.1°F | Resp 16 | Wt 134.7 lb

## 2022-09-04 DIAGNOSIS — Z7901 Long term (current) use of anticoagulants: Secondary | ICD-10-CM | POA: Diagnosis not present

## 2022-09-04 DIAGNOSIS — D61818 Other pancytopenia: Secondary | ICD-10-CM | POA: Diagnosis not present

## 2022-09-04 DIAGNOSIS — Z86711 Personal history of pulmonary embolism: Secondary | ICD-10-CM | POA: Insufficient documentation

## 2022-09-04 DIAGNOSIS — D539 Nutritional anemia, unspecified: Secondary | ICD-10-CM | POA: Diagnosis not present

## 2022-09-04 DIAGNOSIS — D696 Thrombocytopenia, unspecified: Secondary | ICD-10-CM

## 2022-09-04 DIAGNOSIS — I2694 Multiple subsegmental pulmonary emboli without acute cor pulmonale: Secondary | ICD-10-CM

## 2022-09-04 DIAGNOSIS — F17210 Nicotine dependence, cigarettes, uncomplicated: Secondary | ICD-10-CM | POA: Insufficient documentation

## 2022-09-04 DIAGNOSIS — E538 Deficiency of other specified B group vitamins: Secondary | ICD-10-CM

## 2022-09-04 NOTE — Patient Instructions (Signed)
[place] at [facility] Discharge Instructions  You were seen today by Tarri Abernethy PA-C for your low blood cells and your history of blood clots in your legs/lungs.  LOW BLOOD COUNTS: You have low levels of all three main types of blood cells in your body (red blood cells, platelets, white blood cells).  I suspect that this may be caused by a type of bone marrow cancer called "MDS" or myelodysplastic syndrome.   I recommend BONE MARROW BIOPSY for further evaluation, but respect your choice to not have that test at this time. We will continue to monitor your blood counts closely, as MDS can sometimes turn into a type of blood cancer called leukemia.  VITAMIN DEFICIENCIES: Your labs showed some borderline deficiency in vitamin B12 and folate.  You should continue your Z61 and folic acid supplement.  HISTORY OF BLOOD CLOTS: Continue Eliquis indefinitely for prevention of recurrent blood clots.  Seek immediate medical attention if you experience any signs of major bleeding events.  Be careful not to fall at home!  WEIGHT LOSS: Your weight looks better.  Continue to drink at least 1 Ensure beverage per day.   CT SCAN RESULTS: The CT scan of your chest did not show any signs of lung cancer.  The following noncancerous findings should be discussed with your primary care doctor at your next visit. Possible COPD Coronary artery disease, which places you at risk of heart attack. Possible left kidney hydronephrosis (swelling around your kidney that can be caused if you have blockage of urine flow)  MEDICATIONS: - Continue Eliquis - Continue vitamin B12 (cyanocobalamin) 500 mcg daily - Continue folic acid 096 mcg daily  FOLLOW-UP APPOINTMENT: Office visit in 3 months, with labs 1 week before  ** Thank you for trusting me with your healthcare!  I strive to provide all of my patients with quality care at each visit.  If you receive a survey for this visit, I would be so  grateful to you for taking the time to provide feedback.  Thank you in advance!  ~ Alyia Lacerte                   Dr. Derek Jack   &   Tarri Abernethy, PA-C   - - - - - - - - - - - - - - - - - -     Thank you for choosing [place] at [facility] to provide your oncology and hematology care.  To afford each patient quality time with our provider, please arrive at least 15 minutes before your scheduled appointment time.   If you have a lab appointment with the [facility] please come in thru the Main Entrance and check in at the main information desk.  You need to re-schedule your appointment should you arrive 10 or more minutes late.  We strive to give you quality time with our providers, and arriving late affects you and other patients whose appointments are after yours.  Also, if you no show three or more times for appointments you may be dismissed from the clinic at the providers discretion.     Again, thank you for choosing [facility].  Our hope is that these requests will decrease the amount of time that you wait before being seen by our physicians.       _____________________________________________________________  Should you have questions after your visit to [facility], please contact our office  at [phone] and follow the prompts.  Our office hours are 8:00 a.m. and 4:30 p.m. Monday - Friday.  Please note that voicemails left after 4:00 p.m. may not be returned until the following business day.  We are closed weekends and major holidays.  You do have access to a nurse 24-7, just call the main number to the clinic [phone] and do not press any options, hold on the line and a nurse will answer the phone.    For prescription refill requests, have your pharmacy contact our office and allow 72 hours.    Due to Covid, you will need to wear a mask upon entering the hospital. If you do not have a mask, a mask will be given to  you at the Main Entrance upon arrival. For doctor visits, patients may have 1 support person age 38 or older with them. For treatment visits, patients can not have anyone with them due to social distancing guidelines and our immunocompromised population.

## 2022-09-12 LAB — METHYLMALONIC ACID, SERUM: Methylmalonic Acid, Quantitative: 187 nmol/L (ref 0–378)

## 2022-11-04 ENCOUNTER — Other Ambulatory Visit (HOSPITAL_COMMUNITY): Payer: Self-pay | Admitting: Family Medicine

## 2022-11-04 DIAGNOSIS — N1330 Unspecified hydronephrosis: Secondary | ICD-10-CM

## 2022-11-05 NOTE — Progress Notes (Signed)
I have called the patient and reviewed LDCT results from 08/14/22. All questions addressed and answered. Patient aware of the need for yearly follow-up.

## 2022-11-19 ENCOUNTER — Ambulatory Visit (HOSPITAL_COMMUNITY): Payer: Medicare Other

## 2022-12-04 ENCOUNTER — Inpatient Hospital Stay: Payer: Medicare Other | Attending: Physician Assistant

## 2022-12-04 DIAGNOSIS — E538 Deficiency of other specified B group vitamins: Secondary | ICD-10-CM

## 2022-12-04 DIAGNOSIS — F17210 Nicotine dependence, cigarettes, uncomplicated: Secondary | ICD-10-CM | POA: Diagnosis not present

## 2022-12-04 DIAGNOSIS — Z86711 Personal history of pulmonary embolism: Secondary | ICD-10-CM | POA: Diagnosis not present

## 2022-12-04 DIAGNOSIS — I2694 Multiple subsegmental pulmonary emboli without acute cor pulmonale: Secondary | ICD-10-CM

## 2022-12-04 DIAGNOSIS — D61818 Other pancytopenia: Secondary | ICD-10-CM

## 2022-12-04 DIAGNOSIS — D539 Nutritional anemia, unspecified: Secondary | ICD-10-CM | POA: Insufficient documentation

## 2022-12-04 DIAGNOSIS — Z7901 Long term (current) use of anticoagulants: Secondary | ICD-10-CM

## 2022-12-04 DIAGNOSIS — D7589 Other specified diseases of blood and blood-forming organs: Secondary | ICD-10-CM | POA: Insufficient documentation

## 2022-12-04 DIAGNOSIS — D696 Thrombocytopenia, unspecified: Secondary | ICD-10-CM

## 2022-12-04 LAB — CBC WITH DIFFERENTIAL/PLATELET
Abs Immature Granulocytes: 0.01 10*3/uL (ref 0.00–0.07)
Basophils Absolute: 0 10*3/uL (ref 0.0–0.1)
Basophils Relative: 1 %
Eosinophils Absolute: 0.1 10*3/uL (ref 0.0–0.5)
Eosinophils Relative: 2 %
HCT: 36 % — ABNORMAL LOW (ref 39.0–52.0)
Hemoglobin: 11.9 g/dL — ABNORMAL LOW (ref 13.0–17.0)
Immature Granulocytes: 0 %
Lymphocytes Relative: 31 %
Lymphs Abs: 1.2 10*3/uL (ref 0.7–4.0)
MCH: 35.8 pg — ABNORMAL HIGH (ref 26.0–34.0)
MCHC: 33.1 g/dL (ref 30.0–36.0)
MCV: 108.4 fL — ABNORMAL HIGH (ref 80.0–100.0)
Monocytes Absolute: 0.5 10*3/uL (ref 0.1–1.0)
Monocytes Relative: 12 %
Neutro Abs: 2.1 10*3/uL (ref 1.7–7.7)
Neutrophils Relative %: 54 %
Platelets: 75 10*3/uL — ABNORMAL LOW (ref 150–400)
RBC: 3.32 MIL/uL — ABNORMAL LOW (ref 4.22–5.81)
RDW: 13.7 % (ref 11.5–15.5)
WBC: 3.8 10*3/uL — ABNORMAL LOW (ref 4.0–10.5)
nRBC: 0 % (ref 0.0–0.2)

## 2022-12-04 LAB — IRON AND TIBC
Iron: 140 ug/dL (ref 45–182)
Saturation Ratios: 40 % — ABNORMAL HIGH (ref 17.9–39.5)
TIBC: 348 ug/dL (ref 250–450)
UIBC: 208 ug/dL

## 2022-12-04 LAB — FOLATE: Folate: 29.8 ng/mL (ref >5.9)

## 2022-12-04 LAB — COMPREHENSIVE METABOLIC PANEL
ALT: 21 U/L (ref 0–44)
AST: 26 U/L (ref 15–41)
Albumin: 3.6 g/dL (ref 3.5–5.0)
Alkaline Phosphatase: 103 U/L (ref 38–126)
Anion gap: 8 (ref 5–15)
BUN: 14 mg/dL (ref 8–23)
CO2: 26 mmol/L (ref 22–32)
Calcium: 8.8 mg/dL — ABNORMAL LOW (ref 8.9–10.3)
Chloride: 104 mmol/L (ref 98–111)
Creatinine, Ser: 1.31 mg/dL — ABNORMAL HIGH (ref 0.61–1.24)
GFR, Estimated: 56 mL/min — ABNORMAL LOW (ref >60)
Glucose, Bld: 100 mg/dL — ABNORMAL HIGH (ref 70–99)
Potassium: 3 mmol/L — ABNORMAL LOW (ref 3.5–5.1)
Sodium: 138 mmol/L (ref 135–145)
Total Bilirubin: 1.5 mg/dL — ABNORMAL HIGH (ref 0.3–1.2)
Total Protein: 6.3 g/dL — ABNORMAL LOW (ref 6.5–8.1)

## 2022-12-04 LAB — VITAMIN B12: Vitamin B-12: 1489 pg/mL — ABNORMAL HIGH (ref 180–914)

## 2022-12-04 LAB — LACTATE DEHYDROGENASE: LDH: 124 U/L (ref 98–192)

## 2022-12-04 LAB — FERRITIN: Ferritin: 84 ng/mL (ref 24–336)

## 2022-12-05 LAB — HOMOCYSTEINE: Homocysteine: 20.5 umol/L — ABNORMAL HIGH (ref 0.0–19.2)

## 2022-12-09 LAB — METHYLMALONIC ACID, SERUM: Methylmalonic Acid, Quantitative: 317 nmol/L (ref 0–378)

## 2022-12-10 ENCOUNTER — Ambulatory Visit (HOSPITAL_COMMUNITY)
Admission: RE | Admit: 2022-12-10 | Discharge: 2022-12-10 | Disposition: A | Discharge status: Home / Self Care | Payer: Medicare Other | Source: Ambulatory Visit | Attending: Family Medicine | Admitting: Family Medicine

## 2022-12-10 DIAGNOSIS — N1330 Unspecified hydronephrosis: Secondary | ICD-10-CM | POA: Diagnosis not present

## 2022-12-10 MED ORDER — IOHEXOL 300 MG/ML  SOLN
100.0000 mL | Freq: Once | INTRAMUSCULAR | Status: AC | PRN
  Administered 2022-12-10: 100 mL via INTRAVENOUS

## 2022-12-10 NOTE — Progress Notes (Unsigned)
[place], [place] [address]   CLINIC:  Medical Oncology/Hematology  PCP:  Celene Squibb, MD [address] [name] [address] [phone]   REASON FOR VISIT:  Follow-up for pancytopenia and history of bilateral pulmonary embolism   CURRENT THERAPY: Eliquis 5 mg twice daily  INTERVAL HISTORY:   Mr. Kissinger 77 y.o. male returns for routine follow-up of  thrombocytopenia and his history of pulmonary embolism.  He was last seen by Tarri Abernethy PA-C on 09/04/2022.   At today's visit, he reports feeling fair.  No recent hospitalizations, surgeries, or changes in baseline health status.   Regarding his thrombocytopenia, he has easy bruising but no petechial rash.  He denies any hemoptysis, epistaxis, hematochezia, melena, or hematemesis.    No B symptoms such as fever, chills, night sweats.He has not had any recent infections.  Appetite has improved and his weight is slightly improved since his last visit.  He is drinking 1 Ensure daily.  He continues to take Eliquis for his history of PE/DVT.  He has not had any major bleeding events.  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  He has chronic dyspnea on exertion, but denies any acute changes in his respiratory status.  No pleuritic chest pain or palpitations.  He has fallen once in the past year.  He has been taking vitamin Y69 and folic acid supplements since June 2023.     He has 35% energy and 50% appetite. He endorses that he is maintaining a stable weight.   ASSESSMENT & PLAN:  1.  Pancytopenia with macrocytosis - Moderate thrombocytopenia and macrocytic anemia since November 2019.  Development of mild leukopenia since January 2023. - No known history of liver disease - CT abdomen/pelvis (12/10/2022) showed normal-sized spleen and unchanged calcification near the hepatic dome - TSH  normal (10/31/2021).  SPEP and hepatitis panel negative. - Most recent CBC (12/04/2022) shows persistent pancytopenia with WBC 3.8/normal differential, Hgb 11.9, platelets 75.  CMP with creatinine 1.31/GFR 56.  Normal LDH. - Nutritional labs (12/04/2022) normalization of B12/MMA.  Normal folate, elevated (but improved) homocystine.  Ferritin 84, iron saturation 40%. - Admits to easy bruising, but denies any major bleeding episodes or petechial rash.  No bright red blood per rectum or melena.  No B symptoms. - Patient has been taking vitamin S85 and folic acid supplements since June 2023 - Differential diagnosis favors early myelodysplasia.  May have some effects of nutritional deficiency and anemia of CKD.  Less likely to be immune mediated thrombocytopenia or neutropenia.    - PLAN: Persistent but relatively stable pancytopenia. - Continue vitamin B12 500 mcg daily.  Continue folate 400 mcg daily. - Recommend continuing Eliquis (see below), but if platelets drop below 50, we will reassess risk/benefits. - Repeat labs (CBC/D, CMP LDH) and RTC in 3 months.  (We will check nutritional panel again in about 6 months, around August 2024) - Moderate suspicion for MDS.  Strongly recommended bone marrow biopsy to patient, but he refuses at this time - reports that he had a bone marrow biopsy in the 1970s, but he does not recall the reason or the results.  Apparently he had some back pain after this and is hesitant to get another biopsy.  Patient wants to hold off on bone marrow biopsy until it is "absolutely and unavoidably necessary.")  We discussed that  he may have MDS, and if so he has potential for leukemic transformation.  He continues to refuse bone marrow biopsy at this time.   2.  Bilateral pulmonary embolisms/right leg DVT, recurrent, unprovoked - Hospital admission from 10/15/2019 through 10/17/2019 with hemoptysis. - CT chest on 10/16/2019 showed acute bilateral pulmonary emboli greater within the left  lower lobe with no evidence of right heart strain.  Dopplers showed no evidence of DVT. - Eliquis was apparently discontinued in October 2021. - CT angio on 09/30/2020 shows acute PE in segmental and subsegmental branches supplying the right lower, right middle and left lower and left upper lobes. - He was found to have right leg DVT in November 2021 - " extensive right lower extremity DVT extending throughout femoral vein and deep femoral vein, through popliteal vein and into the calf" - Patient was started back on Eliquis in November 2021 after diagnosis of recurrent PE/DVT - He remains on Eliquis 5 mg twice daily, which he is tolerating well - No major bleeding episodes such as bright blood per rectum or melena. - ED visit for fall on 03/14/2022.  He denies any falls since that time. - Patient does have some post thrombotic changes of right lower extremity (intermittent right lower extremity edema and right ankle varicose veins), but does not have any acute signs or symptoms concerning for recurrent DVT or PE at this time - PLAN: Continue indefinite Eliquis.  No bleeding issues reported. - We discussed increased risk of  fall-associated bleeding including hemorrhagic CVA.  Discussed fall safety and prevention at home.  Patient wishes to continue Eliquis at this time, which is reasonable considering his high risk of recurrent VTE if he were to stop anticoagulation.   3.  Weight loss, RESOLVED - Patient had previously lost 15-20 pounds since December 2021, secondary to poor appetite - Appetite improved, weight is stable/improved - LDCT chest (October 2023) did not show any signs of active malignancy - CT scans of head, chest, and pelvis from ED visit on 03/14/2022 negative for any signs of malignancy.     4.  Tobacco abuse - He has been smoking 1 PPD x50 years, continues to smoke 1 pack/day cigarettes - LDCT scan of chest (08/14/2022): Lung RADS 2S, benign appearance or behavior; recommended  repeat LDCT chest in 12 months.  INCIDENTAL FINDINGS discussed with patient as below. Aortic atherosclerosis and LAD coronary artery disease. Findings consistent with COPD Probable left hydronephrosis, incompletely imaged.  (Note that mild left hydronephrosis was also seen on abdominal US 11/22/2021) - patient denies any urinary obstructive symptoms  - PLAN: We will schedule patient for repeat LDCT scan of chest in October 2024. - Recommend PCP follow-up for the above incidental findings   5.  Abnormal LFTs - US abdomen obtained for abnormal LFTs on 11/22/2021 during hospitalization was negative for any focal lesion or abnormalities in the liver or spleen. - CT abdomen/pelvis (12/10/2022) showed normal-sized spleen and unchanged calcification near the hepatic dome - Patient reports history of "liver stones" in the 1980s that required hospitalization and surgical removal - He reports that he has had "problems with his liver numbers" for the past 40+ years - Patient has not seen gastroenterology for "many years."  He was referred to gastroenterology following hospital discharge in January 2023. - Most recent CMP (12/04/2022) shows normal AST and ALT with mildly elevated bilirubin 1.5. - PLAN: Will place referral to gastroenterology due to chronically elevated bilirubin and other abnormal LFTs.  PLAN SUMMARY: >>  Labs in 3 months (CBC/D, CMP, LDH, folate, homocystine, ferritin, iron/TIBC, B12, MMA) >> OFFICE visit in 3 months, 1 week after labs >> Referral to gastroenterology due to chronically elevated bilirubin     REVIEW OF SYSTEMS:   Review of Systems  Constitutional:  Positive for fatigue. Negative for appetite change, chills, diaphoresis and fever.  HENT:   Positive for trouble swallowing (no teeth). Negative for lump/mass and nosebleeds.   Eyes:  Negative for eye problems.  Respiratory:  Positive for shortness of breath (on exertion). Negative for cough and hemoptysis.   Cardiovascular:   Negative for chest pain, leg swelling and palpitations.  Gastrointestinal:  Positive for constipation and nausea. Negative for abdominal pain, blood in stool, diarrhea and vomiting.  Genitourinary:  Negative for dysuria and hematuria.   Skin: Negative.   Neurological:  Positive for dizziness and headaches (migraines). Negative for light-headedness.  Hematological:  Bruises/bleeds easily.  Psychiatric/Behavioral:  Positive for sleep disturbance. The patient is not nervous/anxious.      PHYSICAL EXAM:  ECOG PERFORMANCE STATUS: 2 - Symptomatic, <50% confined to bed  There were no vitals filed for this visit. There were no vitals filed for this visit. Physical Exam Constitutional:      Appearance: Normal appearance.     Comments: Cachexia appears improved on today's exam.  HENT:     Head: Normocephalic and atraumatic.     Mouth/Throat:     Mouth: Mucous membranes are moist.  Eyes:     Extraocular Movements: Extraocular movements intact.     Pupils: Pupils are equal, round, and reactive to light.  Cardiovascular:     Rate and Rhythm: Normal rate and regular rhythm.     Pulses: Normal pulses.     Heart sounds: Normal heart sounds.  Pulmonary:     Effort: Pulmonary effort is normal.     Breath sounds: Normal breath sounds. Decreased air movement present.  Abdominal:     General: Bowel sounds are normal.     Palpations: Abdomen is soft.     Tenderness: There is no abdominal tenderness.  Musculoskeletal:        General: No swelling.     Right lower leg: No edema.     Left lower leg: No edema.  Lymphadenopathy:     Cervical: No cervical adenopathy.  Skin:    General: Skin is warm and dry.  Neurological:     General: No focal deficit present.     Mental Status: He is alert and oriented to person, place, and time.  Psychiatric:        Mood and Affect: Mood normal.        Behavior: Behavior normal.     PAST MEDICAL/SURGICAL HISTORY:  Past Medical History:  Diagnosis Date    Bursitis    l shoulder   Cataract    Elevated bilirubin    H/O: rheumatic fever    Kidney stones    Loss of teeth due to extraction    Migraines    Pulmonary embolism (HCC)    Rheumatic fever    Tendonitis    L shoulder   Past Surgical History:  Procedure Laterality Date   ABDOMINAL SURGERY     CATARACT EXTRACTION, BILATERAL     CHOLECYSTECTOMY     CRANIOTOMY Bilateral 09/06/2018   Procedure: BILATERAL CRANIOTOMIES FOR SUBDURAL HEMATOMA EVACUATION;  Surgeon: Erline Levine, MD;  Location: [facility];  Service: Neurosurgery;  Laterality: Bilateral;   EYE SURGERY      SOCIAL  HISTORY:  Social History   Socioeconomic History   Marital status: Widowed    Spouse name: Not on file   Number of children: Not on file   Years of education: Not on file   Highest education level: Not on file  Occupational History   Occupation: retired  Tobacco Use   Smoking status: Every Day    Packs/day: 1.00    Years: 50.00    Total pack years: 50.00    Types: Cigarettes   Smokeless tobacco: Never  Vaping Use   Vaping Use: Never used  Substance and Sexual Activity   Alcohol use: Yes    Comment: twice a week   Drug use: Never   Sexual activity: Not Currently  Other Topics Concern   Not on file  Social History Narrative   Not on file   Social Determinants of Health   Financial Resource Strain: Not on file  Food Insecurity: Not on file  Transportation Needs: Not on file  Physical Activity: Not on file  Stress: Not on file  Social Connections: Not on file  Intimate Partner Violence: Not on file    FAMILY HISTORY:  Family History  Problem Relation Age of Onset   Atrial fibrillation Mother    Macular degeneration Mother    Atrial fibrillation Father    Macular degeneration Father    Diabetes Brother    Seizures Brother    Pancreatic cancer Brother    Obesity Brother     CURRENT MEDICATIONS:  Outpatient Encounter Medications as of 12/11/2022  Medication Sig   AIMOVIG 140 MG/ML  SOAJ Inject 140 mg into the skin every 30 (thirty) days.   apixaban (ELIQUIS) 5 MG TABS tablet Take 2 tablets by mouth twice a day for 7 days; then start taking 1 tablet by mouth twice a day on daily basis. (Patient taking differently: Take 5 mg by mouth 2 (two) times daily.)   cyclobenzaprine (FLEXERIL) 10 MG tablet Take 10 mg by mouth every 8 (eight) hours as needed.   folic acid (FOLVITE) 400 MCG tablet Take 1 tablet (400 mcg total) by mouth daily.   HYDROcodone-acetaminophen (NORCO/VICODIN) 5-325 MG tablet Take one half to one tab po q 6 hrs prn pain   ondansetron (ZOFRAN) 4 MG tablet Take 4 mg by mouth every 6 (six) hours as needed for nausea or vomiting. (Patient not taking: Reported on 09/04/2022)   vitamin B-12 (CYANOCOBALAMIN) 500 MCG tablet Take 1 tablet (500 mcg total) by mouth daily.   No facility-administered encounter medications on file as of 12/11/2022.    ALLERGIES:  Allergies  Allergen Reactions   Floxacillin (Flucloxacillin) Anaphylaxis    LABORATORY DATA:  I have reviewed the labs as listed.  CBC    Component Value Date/Time   WBC 3.8 (L) 12/04/2022 1314   RBC 3.32 (L) 12/04/2022 1314   HGB 11.9 (L) 12/04/2022 1314   HCT 36.0 (L) 12/04/2022 1314   PLT 75 (L) 12/04/2022 1314   MCV 108.4 (H) 12/04/2022 1314   MCH 35.8 (H) 12/04/2022 1314   MCHC 33.1 12/04/2022 1314   RDW 13.7 12/04/2022 1314   LYMPHSABS 1.2 12/04/2022 1314   MONOABS 0.5 12/04/2022 1314   EOSABS 0.1 12/04/2022 1314   BASOSABS 0.0 12/04/2022 1314      Latest Ref Rng & Units 12/04/2022    1:14 PM 08/14/2022   12:52 PM 03/14/2022   12:26 PM  CMP  Glucose 70 - 99 mg/dL 563  77  91   BUN  8 - 23 mg/dL 14  11  15    Creatinine 0.61 - 1.24 mg/dL 1.31  1.22  1.44   Sodium 135 - 145 mmol/L 138  142  137   Potassium 3.5 - 5.1 mmol/L 3.0  3.6  3.7   Chloride 98 - 111 mmol/L 104  110  105   CO2 22 - 32 mmol/L 26  27  26    Calcium 8.9 - 10.3 mg/dL 8.8  8.5  8.9   Total Protein 6.5 - 8.1 g/dL 6.3  5.8     Total Bilirubin 0.3 - 1.2 mg/dL 1.5  1.4    Alkaline Phos 38 - 126 U/L 103  101    AST 15 - 41 U/L 26  42    ALT 0 - 44 U/L 21  34      DIAGNOSTIC IMAGING:  I have independently reviewed the relevant imaging and discussed with the patient.   WRAP UP:  All questions were answered. The patient knows to call the clinic with any problems, questions or concerns.  Medical decision making: Moderate  Time spent on visit: I spent 20 minutes counseling the patient face to face. The total time spent in the appointment was 30 minutes and more than 50% was on counseling.  Harriett Rush, PA-C  12/11/22 4:02 PM

## 2022-12-11 ENCOUNTER — Inpatient Hospital Stay (HOSPITAL_BASED_OUTPATIENT_CLINIC_OR_DEPARTMENT_OTHER): Payer: Medicare Other | Admitting: Physician Assistant

## 2022-12-11 VITALS — BP 140/85 | HR 83 | Temp 98.6°F | Resp 17 | Ht 70.0 in | Wt 136.6 lb

## 2022-12-11 DIAGNOSIS — Z7901 Long term (current) use of anticoagulants: Secondary | ICD-10-CM | POA: Diagnosis not present

## 2022-12-11 DIAGNOSIS — Z86718 Personal history of other venous thrombosis and embolism: Secondary | ICD-10-CM | POA: Diagnosis not present

## 2022-12-11 DIAGNOSIS — D61818 Other pancytopenia: Secondary | ICD-10-CM | POA: Diagnosis not present

## 2022-12-11 NOTE — Patient Instructions (Signed)
[place] at [facility] Discharge Instructions  You were seen today by Tarri Abernethy PA-C for your low blood cells and your history of blood clots in your legs/lungs.  LOW BLOOD COUNTS: You have low levels of all three main types of blood cells in your body (red blood cells, platelets, white blood cells).  I suspect that this may be caused by a type of bone marrow cancer called "MDS" or myelodysplastic syndrome.   I recommend BONE MARROW BIOPSY for further evaluation, but respect your choice to not have that test at this time. We will continue to monitor your blood counts closely, as MDS can sometimes turn into a type of blood cancer called leukemia.  VITAMIN DEFICIENCIES: Your labs showed improved vitamin B12 and folate.  You should continue your Y10 and folic acid supplement.  HISTORY OF BLOOD CLOTS: Continue Eliquis indefinitely for prevention of recurrent blood clots.  Seek immediate medical attention if you experience any signs of major bleeding events.  Be careful not to fall at home!  WEIGHT LOSS: Your weight looks better.  Continue to drink at least 1 Ensure beverage per day.   MEDICATIONS: - Continue Eliquis - Continue vitamin B12 (cyanocobalamin) 500 mcg daily - Continue folic acid 175 mcg daily  FOLLOW-UP APPOINTMENT: Office visit in 3 months, with labs 1 week before  ** Thank you for trusting me with your healthcare!  I strive to provide all of my patients with quality care at each visit.  If you receive a survey for this visit, I would be so grateful to you for taking the time to provide feedback.  Thank you in advance!  ~ Mekaela Azizi                   Dr. Derek Jack   &   Tarri Abernethy, PA-C   - - - - - - - - - - - - - - - - - -     Thank you for choosing [employer] [facility] to provide your oncology and hematology care.  To afford each patient quality time with our provider, please arrive at least 15  minutes before your scheduled appointment time.   If you have a lab appointment with the [facility] please come in thru the Main Entrance and check in at the main information desk.  You need to re-schedule your appointment should you arrive 10 or more minutes late.  We strive to give you quality time with our providers, and arriving late affects you and other patients whose appointments are after yours.  Also, if you no show three or more times for appointments you may be dismissed from the clinic at the providers discretion.     Again, thank you for choosing [facility].  Our hope is that these requests will decrease the amount of time that you wait before being seen by our physicians.       _____________________________________________________________  Should you have questions after your visit to [facility], please contact our office at [phone] and follow the prompts.  Our office hours are 8:00 a.m. and 4:30 p.m. Monday - Friday.  Please note that voicemails left after 4:00 p.m. may not be returned until the following business day.  We are closed weekends and major holidays.  You do have access to a nurse 24-7, just call the main number to the clinic [phone] and do not press any options,  hold on the line and a nurse will answer the phone.    For prescription refill requests, have your pharmacy contact our office and allow 72 hours.    Due to Covid, you will need to wear a mask upon entering the hospital. If you do not have a mask, a mask will be given to you at the Main Entrance upon arrival. For doctor visits, patients may have 1 support person age 84 or older with them. For treatment visits, patients can not have anyone with them due to social distancing guidelines and our immunocompromised population.

## 2022-12-15 ENCOUNTER — Other Ambulatory Visit: Payer: Self-pay

## 2022-12-15 DIAGNOSIS — Z7901 Long term (current) use of anticoagulants: Secondary | ICD-10-CM

## 2022-12-15 DIAGNOSIS — D696 Thrombocytopenia, unspecified: Secondary | ICD-10-CM

## 2022-12-15 DIAGNOSIS — D539 Nutritional anemia, unspecified: Secondary | ICD-10-CM

## 2022-12-15 DIAGNOSIS — D61818 Other pancytopenia: Secondary | ICD-10-CM

## 2022-12-15 DIAGNOSIS — E538 Deficiency of other specified B group vitamins: Secondary | ICD-10-CM

## 2022-12-16 ENCOUNTER — Encounter: Payer: Self-pay | Admitting: *Deleted

## 2023-01-07 ENCOUNTER — Telehealth (INDEPENDENT_AMBULATORY_CARE_PROVIDER_SITE_OTHER): Payer: Self-pay | Admitting: Gastroenterology

## 2023-01-07 ENCOUNTER — Ambulatory Visit (INDEPENDENT_AMBULATORY_CARE_PROVIDER_SITE_OTHER): Payer: Medicare Other | Admitting: Gastroenterology

## 2023-01-07 ENCOUNTER — Encounter: Payer: Self-pay | Admitting: Gastroenterology

## 2023-01-07 ENCOUNTER — Ambulatory Visit: Payer: Medicare Other | Admitting: Gastroenterology

## 2023-01-07 VITALS — BP 137/85 | HR 98 | Temp 97.8°F | Ht 70.0 in | Wt 134.9 lb

## 2023-01-07 DIAGNOSIS — R17 Unspecified jaundice: Secondary | ICD-10-CM

## 2023-01-07 NOTE — Telephone Encounter (Signed)
Left message for pt to return call. Pt RUQ [place] appt is 01/19/23 at 8:30 AM arrive at 8:15 AM. NPO after midnight.

## 2023-01-07 NOTE — Patient Instructions (Signed)
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 We are checking labs to confirm this.  I have also ordered an ultrasound to be thorough!  Further recommendations to follow.  It was a pleasure to see you today. I want to create trusting relationships with patients and provide genuine, compassionate, and quality care. I truly value your feedback, so please be on the lookout for a survey regarding your visit with me today. I appreciate your time in completing this!    Annitta Needs, PhD, ANP-BC [facility]

## 2023-01-07 NOTE — Telephone Encounter (Signed)
Pt returned call and verbalized understanding  

## 2023-01-07 NOTE — Progress Notes (Signed)
Gastroenterology Office Note    Referring Provider: Tarri Abernethy, PA-C Primary Care Physician:  Celene Squibb, MD  Primary GI: Dr. Abbey Chatters    Chief Complaint   Chief Complaint  Patient presents with   New Patient (Initial Visit)    Patient here today due to Hyperbilirubinemia. Denies any abdominal pain. He does have occasions of nausea, and constipation.     History of Present Illness   Brian Stark is a 77 y.o. male presenting today at the request of Tarri Abernethy, PA-C, due to elevated bilirubin.   Review of labs show chronically elevated bilirubin. Most recent Tbili 1.5 and normal alk phos and transaminases. He has had chronically mildly elevated bilirubin. Appears indirect greater than direct on fractionated labs in 2022 but has not been fractionated since that time.   Jan 2023 bump in LFTs with Tbili up to 5, Alk Phos 205, AST 379, ALT 174 during hospitalization for syncope. He had pre-syncopal episode and was in the tub for a period of time; his son found him. Acute hepatitis panel was negative in Jan 2023. Transaminases fell drastically during admission but were not followed thereafter.   He notes a remote history of choledocholithiasis when he was in the [facility] Was hospitalized for a prolonged time and had T-tube for drainage. Ultimately cholecystectomy.   Denies abdominal pain. Infrequent nausea noted, dating back 10 years. Declining any further evaluation for nausea. Eats one meal a day, which is his baseline. No dysphagia. He does have documented weight loss. He was in the mid 160s in 2016, mid 140s/150s in 2020/2021, and down to the 130s in 2022. He dropped as low as 122 in Jan 2023; however, he has had weight gain and stable now in the mid 130s for a year.   He is followed by Hematology for pancytopenia with macrocytosis. Moderate suspicion for MDS and has been advised bone marrow biopsy. Patient is considering this.    Past Medical History:   Diagnosis Date   Bursitis    l shoulder   Cataract    Elevated bilirubin    H/O: rheumatic fever    Kidney stones    Loss of teeth due to extraction    Migraines    Pulmonary embolism (HCC)    Rheumatic fever    Tendonitis    L shoulder    Past Surgical History:  Procedure Laterality Date   ABDOMINAL SURGERY     CATARACT EXTRACTION, BILATERAL     CHOLECYSTECTOMY     CRANIOTOMY Bilateral 09/06/2018   Procedure: BILATERAL CRANIOTOMIES FOR SUBDURAL HEMATOMA EVACUATION;  Surgeon: Erline Levine, MD;  Location: [place];  Service: Neurosurgery;  Laterality: Bilateral;   EYE SURGERY      Current Outpatient Medications  Medication Sig Dispense Refill   apixaban (ELIQUIS) 5 MG TABS tablet Take 2 tablets by mouth twice a day for 7 days; then start taking 1 tablet by mouth twice a day on daily basis. (Patient taking differently: Take 5 mg by mouth 2 (two) times daily.) 60 tablet 3   Budeson-Glycopyrrol-Formoterol (BREZTRI AEROSPHERE) 160-9-4.8 MCG/ACT AERO Inhale into the lungs. prn     cyclobenzaprine (FLEXERIL) 10 MG tablet Take 10 mg by mouth every 8 (eight) hours as needed.     folic acid (FOLVITE) A999333 MCG tablet Take 1 tablet (400 mcg total) by mouth daily. 30 tablet 11   HYDROcodone-acetaminophen (NORCO/VICODIN) 5-325 MG tablet Take one half to one tab po q 6 hrs prn pain 10  tablet 0   ondansetron (ZOFRAN) 8 MG tablet Take 4 mg by mouth 2 (two) times daily.     OVER THE COUNTER MEDICATION Nerve Tonic occasionally     OVER THE COUNTER MEDICATION Hyland's Leg Cramps PM fast and Effective four times per week.     vitamin B-12 (CYANOCOBALAMIN) 500 MCG tablet Take 1 tablet (500 mcg total) by mouth daily. (Patient taking differently: Take 1,000 mcg by mouth daily.) 30 tablet 11   AIMOVIG 140 MG/ML SOAJ Inject 140 mg into the skin every 30 (thirty) days. (Patient not taking: Reported on 01/07/2023)     No current facility-administered medications for this visit.    Allergies as of 01/07/2023  - Review Complete 01/07/2023  Allergen Reaction Noted   Floxacillin (flucloxacillin) Anaphylaxis 06/16/2017    Family History  Problem Relation Age of Onset   Atrial fibrillation Mother    Macular degeneration Mother    Atrial fibrillation Father    Macular degeneration Father    Diabetes Brother    Seizures Brother    Pancreatic cancer Brother    Obesity Brother     Social History   Socioeconomic History   Marital status: Widowed    Spouse name: Not on file   Number of children: Not on file   Years of education: Not on file   Highest education level: Not on file  Occupational History   Occupation: retired  Tobacco Use   Smoking status: Every Day    Packs/day: 1.00    Years: 50.00    Total pack years: 50.00    Types: Cigarettes   Smokeless tobacco: Never  Vaping Use   Vaping Use: Never used  Substance and Sexual Activity   Alcohol use: Yes    Comment: twice a week   Drug use: Never   Sexual activity: Not Currently  Other Topics Concern   Not on file  Social History Narrative   Not on file   Social Determinants of Health   Financial Resource Strain: Not on file  Food Insecurity: Not on file  Transportation Needs: Not on file  Physical Activity: Not on file  Stress: Not on file  Social Connections: Not on file  Intimate Partner Violence: Not on file     Review of Systems   Gen: Denies any fever, chills, fatigue, weight loss, lack of appetite.  CV: Denies chest pain, heart palpitations, peripheral edema, syncope.  Resp: Denies shortness of breath at rest or with exertion. Denies wheezing or cough.  GI: Denies dysphagia or odynophagia. Denies jaundice, hematemesis, fecal incontinence. GU : Denies urinary burning, urinary frequency, urinary hesitancy MS: Denies joint pain, muscle weakness, cramps, or limitation of movement.  Derm: Denies rash, itching, dry skin Psych: Denies depression, anxiety, memory loss, and confusion Heme: Denies bruising, bleeding,  and enlarged lymph nodes.   Physical Exam   BP 137/85 (BP Location: Left Arm, Patient Position: Sitting, Cuff Size: Normal)   Pulse 98   Temp 97.8 F (36.6 C) (Temporal)   Ht '5\' 10"'$  (1.778 m)   Wt 134 lb 14.4 oz (61.2 kg)   BMI 19.36 kg/m  General:   Alert and oriented. Pleasant and cooperative. Thin.  Head:  Normocephalic and atraumatic. Eyes:  Without icterus Ears:  Normal auditory acuity. Lungs:  Clear to auscultation bilaterally.  Heart:  S1, S2 present without murmurs appreciated.  Abdomen:  +BS, soft, non-tender and non-distended. No HSM noted. No guarding or rebound. No masses appreciated.  Rectal:  Deferred  Msk:  Symmetrical without gross deformities. Normal posture. Extremities:  Without edema. Neurologic:  Alert and  oriented x4;  grossly normal neurologically. Skin:  Intact without significant lesions or rashes. Psych:  Alert and cooperative. Normal mood and affect.   Assessment   Brian Stark is a 77 y.o. male presenting today at the request of Tarri Abernethy, PA-C, due to elevated bilirubin.   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 I do note bump in LFTs during hospitalization for syncope in Jan 2023., Acute hep panel negative Jan 2023. Transaminases fell drastically during that admission but not followed up thereafter. I suspect the bump was related to ischemic hepatitis. Most recent labs with Tbili 1.5, normal alk phos and transaminases. Will start with HFP, so we can fractionate bili. US abdomen in near future.   Documented weight loss: followed by Hematology for pancytopenia and macrocytosis. Moderate suspicion for MDS; patient has been advised to complete bone marrow biopsy and is considering this.      PLAN   HFP US abdomen Further recommendations to follow   Annitta Needs, PhD, ANP-BC [facility] Gastroenterology

## 2023-01-08 LAB — HEPATIC FUNCTION PANEL
AG Ratio: 1.7 (calc) (ref 1.0–2.5)
ALT: 14 U/L (ref 9–46)
AST: 18 U/L (ref 10–35)
Albumin: 4 g/dL (ref 3.6–5.1)
Alkaline phosphatase (APISO): 92 U/L (ref 35–144)
Bilirubin, Direct: 0.5 mg/dL — ABNORMAL HIGH (ref 0.0–0.2)
Globulin: 2.3 g/dL (calc) (ref 1.9–3.7)
Indirect Bilirubin: 1.4 mg/dL (calc) — ABNORMAL HIGH (ref 0.2–1.2)
Total Bilirubin: 1.9 mg/dL — ABNORMAL HIGH (ref 0.2–1.2)
Total Protein: 6.3 g/dL (ref 6.1–8.1)

## 2023-01-09 ENCOUNTER — Telehealth: Payer: Self-pay | Admitting: Physician Assistant

## 2023-01-09 NOTE — Telephone Encounter (Signed)
I returned the patient's call and discussed with him at length the risks and benefits of bone marrow biopsy.  He is considering going through with bone marrow biopsy (indication = pancytopenia and macrocytic anemia concerning for MDS), but would like some additional time to think through this.  He will call our office to let us know what he has decided.

## 2023-01-13 ENCOUNTER — Ambulatory Visit: Payer: Medicare Other | Admitting: Gastroenterology

## 2023-01-19 ENCOUNTER — Ambulatory Visit (HOSPITAL_COMMUNITY)
Admission: RE | Admit: 2023-01-19 | Discharge: 2023-01-19 | Disposition: A | Discharge status: Home / Self Care | Payer: Medicare Other | Source: Ambulatory Visit | Attending: Gastroenterology | Admitting: Gastroenterology

## 2023-01-19 DIAGNOSIS — R17 Unspecified jaundice: Secondary | ICD-10-CM | POA: Diagnosis present

## 2023-01-19 NOTE — Progress Notes (Signed)
H&P  Chief Complaint: ED  History of Present Illness: 77 yo male is here for evaluation and management of newfound left hydronephrosis.  CT scan was performed on 12/10/2022 to follow-up a left renal lesion and hydronephrosis seen on CT scan of the chest.  Results as follows:  1. Slightly increased mild to moderate left hydronephrosis compared to 09/19/2019 with abrupt caliber transition at the ureteropelvic junction. 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 2. Multiple left-sided renal stones measuring up to 5 mm in the lower pole. Layering radiodensities within the bladder, likely bladder stones. 3. Bilateral Bosniak 1 cysts. No specific follow-up imaging recommended. 4. Enlargement of the prostate with median lobe hypertrophy. 5. Aortic Atherosclerosis (ICD10-I70.0).Marland Kitchen   He has multiple medical problems including ASCVD, anemia of chronic dz, traumatic brain injury, CKD St 3, COPD, tobacco abuse.  He has a remote history of kidney stones.  He has no recent flank pain or gross hematuria.  Past Medical History:  Diagnosis Date   Bursitis    l shoulder   Cataract    Elevated bilirubin    H/O: rheumatic fever    Kidney stones    Loss of teeth due to extraction    Migraines    Pulmonary embolism (HCC)    Rheumatic fever    Tendonitis    L shoulder    Past Surgical History:  Procedure Laterality Date   ABDOMINAL SURGERY     CATARACT EXTRACTION, BILATERAL     CHOLECYSTECTOMY     CRANIOTOMY Bilateral 09/06/2018   Procedure: BILATERAL CRANIOTOMIES FOR SUBDURAL HEMATOMA EVACUATION;  Surgeon: Erline Levine, MD;  Location: [place];  Service: Neurosurgery;  Laterality: Bilateral;   EYE SURGERY      Home Medications:  Allergies as of 01/20/2023       Reactions   Floxacillin (flucloxacillin) Anaphylaxis        Medication List        Accurate as of January 19, 2023  8:41 PM. If you have any questions, ask your nurse or doctor.          apixaban 5 MG Tabs  tablet Commonly known as: ELIQUIS Take 2 tablets by mouth twice a day for 7 days; then start taking 1 tablet by mouth twice a day on daily basis. What changed:  how much to take how to take this when to take this additional instructions   Breztri Aerosphere 160-9-4.8 MCG/ACT Aero Generic drug: Budeson-Glycopyrrol-Formoterol Inhale into the lungs. prn   cyanocobalamin 500 MCG tablet Commonly known as: VITAMIN B12 Take 1 tablet (500 mcg total) by mouth daily. What changed: how much to take   cyclobenzaprine 10 MG tablet Commonly known as: FLEXERIL Take 10 mg by mouth every 8 (eight) hours as needed.   folic acid A999333 MCG tablet Commonly known as: FOLVITE Take 1 tablet (400 mcg total) by mouth daily.   HYDROcodone-acetaminophen 5-325 MG tablet Commonly known as: NORCO/VICODIN Take one half to one tab po q 6 hrs prn pain   ondansetron 8 MG tablet Commonly known as: ZOFRAN Take 4 mg by mouth 2 (two) times daily.   OVER THE COUNTER MEDICATION Nerve Tonic occasionally   OVER THE COUNTER MEDICATION Hyland's Leg Cramps PM fast and Effective four times per week.        Allergies:  Allergies  Allergen Reactions   Floxacillin (Flucloxacillin) Anaphylaxis    Family History  Problem Relation Age of Onset   Atrial fibrillation Mother    Macular degeneration Mother  Atrial fibrillation Father    Macular degeneration Father    Diabetes Brother    Seizures Brother    Pancreatic cancer Brother    Obesity Brother     Social History:  reports that he has been smoking cigarettes. He has a 50.00 pack-year smoking history. He has never used smokeless tobacco. He reports current alcohol use. He reports that he does not use drugs.  ROS: A complete review of systems was performed.  All systems are negative except for pertinent findings as noted.  Physical Exam:  Vital signs in last 24 hours: There were no vitals taken for this visit. Constitutional:  Alert and oriented, No  acute distress Cardiovascular: Regular rate  Respiratory: Normal respiratory effort Neurologic: Grossly intact, no focal deficits Psychiatric: Normal mood and affect  I have reviewed prior pt notes  I have reviewed notes from referring/previous physicians  I have reviewed urinalysis results  I have independently reviewed prior imaging--CT- 1. Slightly increased mild to moderate left hydronephrosis compared to 09/19/2019 with abrupt caliber transition at the ureteropelvic junction. 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 2. Multiple left-sided renal stones measuring up to 5 mm in the lower pole. Layering radiodensities within the bladder, likely bladder stones. 3. Bilateral Bosniak 1 cysts. No specific follow-up imaging recommended. 4. Enlargement of the prostate with median lobe hypertrophy. 5. Aortic Atherosclerosis (ICD10-I70.0).    I have reviewed prior PSA results   Prior CT scan from 2020 and renal ultrasound reviewed  Urinalysis reviewed   Impression/Assessment:  1.  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  Asymptomatic left lower pole calculi  3.  Bosniak category 1 left renal cysts  4.  Associated left renal atrophy  5.  Probable bladder calculi  Plan:  1.  I will have him set up for Lasix renogram to assess emptying of the left renal unit.  If significant abnormal T1 half, will consider cystoscopy, retrograde  2.  I will call with results as well as follow-up

## 2023-01-20 ENCOUNTER — Encounter: Payer: Self-pay | Admitting: Urology

## 2023-01-20 ENCOUNTER — Ambulatory Visit (INDEPENDENT_AMBULATORY_CARE_PROVIDER_SITE_OTHER): Payer: Medicare Other | Admitting: Urology

## 2023-01-20 VITALS — BP 149/81 | HR 96 | Ht 70.0 in | Wt 135.0 lb

## 2023-01-20 DIAGNOSIS — N200 Calculus of kidney: Secondary | ICD-10-CM | POA: Diagnosis not present

## 2023-01-20 DIAGNOSIS — N261 Atrophy of kidney (terminal): Secondary | ICD-10-CM | POA: Diagnosis not present

## 2023-01-20 DIAGNOSIS — N1330 Unspecified hydronephrosis: Secondary | ICD-10-CM

## 2023-01-20 LAB — URINALYSIS, ROUTINE W REFLEX MICROSCOPIC
Bilirubin, UA: NEGATIVE
Glucose, UA: NEGATIVE
Ketones, UA: NEGATIVE
Leukocytes,UA: NEGATIVE
Nitrite, UA: NEGATIVE
Protein,UA: NEGATIVE
RBC, UA: NEGATIVE
Specific Gravity, UA: 1.01 (ref 1.005–1.030)
Urobilinogen, Ur: 0.2 mg/dL (ref 0.2–1.0)
pH, UA: 5.5 (ref 5.0–7.5)

## 2023-01-26 ENCOUNTER — Other Ambulatory Visit: Payer: Self-pay | Admitting: *Deleted

## 2023-01-26 NOTE — Progress Notes (Signed)
error 

## 2023-01-27 ENCOUNTER — Other Ambulatory Visit: Payer: Self-pay | Admitting: *Deleted

## 2023-01-27 DIAGNOSIS — K838 Other specified diseases of biliary tract: Secondary | ICD-10-CM

## 2023-01-28 ENCOUNTER — Encounter (HOSPITAL_COMMUNITY)
Admission: RE | Admit: 2023-01-28 | Discharge: 2023-01-28 | Disposition: A | Discharge status: Home / Self Care | Payer: Medicare Other | Source: Ambulatory Visit | Attending: Urology | Admitting: Urology

## 2023-01-28 DIAGNOSIS — N1330 Unspecified hydronephrosis: Secondary | ICD-10-CM | POA: Diagnosis not present

## 2023-01-28 MED ORDER — FUROSEMIDE 10 MG/ML IJ SOLN: 30.0000 mg | Freq: Once | INTRAMUSCULAR | Status: AC

## 2023-01-28 MED ORDER — FUROSEMIDE 10 MG/ML IJ SOLN
INTRAMUSCULAR | Status: AC
  Administered 2023-01-28: 30 mg via INTRAVENOUS
  Filled 2023-01-28: qty 4

## 2023-01-28 MED ORDER — TECHNETIUM TC 99M MERTIATIDE
5.0000 | Freq: Once | INTRAVENOUS | Status: AC | PRN
  Administered 2023-01-28: 5.1 via INTRAVENOUS

## 2023-02-11 ENCOUNTER — Telehealth: Payer: Self-pay

## 2023-02-11 NOTE — Telephone Encounter (Signed)
Patient call in today for results of renal imaging flow on 03/27. Patient is aware that a task will be sent to MD and once review someone will reach back out with MD recommendation. Patient voiced understanding.

## 2023-02-19 ENCOUNTER — Telehealth: Payer: Self-pay

## 2023-02-19 NOTE — Telephone Encounter (Signed)
Left voicemail for patient to return call for results.  Follow up with MD on 05/07.

## 2023-02-19 NOTE — Telephone Encounter (Signed)
-----   Message from Marcine Matar, MD sent at 02/18/2023 12:56 PM EDT ----- Call patient-he has markedly decreased function in his left kidney.  Schedule him to see me within the next 2 to 3 weeks to discuss further management. ----- Message ----- From: Grier Rocher, CMA Sent: 01/28/2023   4:41 PM EDT To: Marcine Matar, MD  Please review.

## 2023-02-20 NOTE — Telephone Encounter (Signed)
Patient returned call.  He is aware of MD response and voiced understanding.  He will keep his f/u on 05/07 as scheduled.

## 2023-02-23 ENCOUNTER — Ambulatory Visit (HOSPITAL_COMMUNITY)
Admission: RE | Admit: 2023-02-23 | Discharge: 2023-02-23 | Disposition: A | Discharge status: Home / Self Care | Payer: Medicare Other | Source: Ambulatory Visit | Attending: Gastroenterology | Admitting: Gastroenterology

## 2023-02-23 DIAGNOSIS — K838 Other specified diseases of biliary tract: Secondary | ICD-10-CM | POA: Diagnosis present

## 2023-02-23 MED ORDER — GADOBUTROL 1 MMOL/ML IV SOLN
7.0000 mL | Freq: Once | INTRAVENOUS | Status: AC | PRN
  Administered 2023-02-23: 7 mL via INTRAVENOUS

## 2023-03-03 ENCOUNTER — Other Ambulatory Visit: Payer: Self-pay

## 2023-03-03 DIAGNOSIS — R17 Unspecified jaundice: Secondary | ICD-10-CM

## 2023-03-08 NOTE — Progress Notes (Signed)
History of Present Illness:   77 yo male is here for evaluation and management of newfound left hydronephrosis.   CT scan was performed on 12/10/2022 to follow-up a left renal lesion and hydronephrosis seen on CT scan of the chest.  Results as follows:   1. Slightly increased mild to moderate left hydronephrosis compared to 09/19/2019 with abrupt caliber transition at the ureteropelvic junction. 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 2. Multiple left-sided renal stones measuring up to 5 mm in the lower pole. Layering radiodensities within the bladder, likely bladder stones. 3. Bilateral Bosniak 1 cysts. No specific follow-up imaging recommended. 4. Enlargement of the prostate with median lobe hypertrophy. 5. Aortic Atherosclerosis (ICD10-I70.0).   He has multiple medical problems including ASCVD, anemia of chronic dz, traumatic brain injury, CKD St 3, COPD, tobacco abuse.  He has a remote history of kidney stones.  He has no recent flank pain or gross hematuria.  3.27.2024: Lasix renogram-- Normal RIGHT renogram. Markedly diminished LEFT renal function with LEFT hydronephrosis and evidence of LEFT urinary outflow obstruction. Asymmetric renal function 80% RIGHT versus 20% LEFT.  Past Medical History:  Diagnosis Date   Bursitis    l shoulder   Cataract    Elevated bilirubin    H/O: rheumatic fever    Kidney stones    Loss of teeth due to extraction    Migraines    Pulmonary embolism (HCC)    Rheumatic fever    Tendonitis    L shoulder    Past Surgical History:  Procedure Laterality Date   ABDOMINAL SURGERY     CATARACT EXTRACTION, BILATERAL     CHOLECYSTECTOMY     CRANIOTOMY Bilateral 09/06/2018   Procedure: BILATERAL CRANIOTOMIES FOR SUBDURAL HEMATOMA EVACUATION;  Surgeon: Maeola Harman, MD;  Location: [facility];  Service: Neurosurgery;  Laterality: Bilateral;   EYE SURGERY      Home Medications:  Allergies as of 03/10/2023       Reactions   Floxacillin  (flucloxacillin) Anaphylaxis        Medication List        Accurate as of Mar 08, 2023  5:12 PM. If you have any questions, ask your nurse or doctor.          apixaban 5 MG Tabs tablet Commonly known as: ELIQUIS Take 2 tablets by mouth twice a day for 7 days; then start taking 1 tablet by mouth twice a day on daily basis. What changed:  how much to take how to take this when to take this additional instructions   Breztri Aerosphere 160-9-4.8 MCG/ACT Aero Generic drug: Budeson-Glycopyrrol-Formoterol Inhale into the lungs. prn   cyanocobalamin 500 MCG tablet Commonly known as: VITAMIN B12 Take 1 tablet (500 mcg total) by mouth daily. What changed: how much to take   cyclobenzaprine 10 MG tablet Commonly known as: FLEXERIL Take 10 mg by mouth every 8 (eight) hours as needed.   folic acid 400 MCG tablet Commonly known as: FOLVITE Take 1 tablet (400 mcg total) by mouth daily.   HYDROcodone-acetaminophen 5-325 MG tablet Commonly known as: NORCO/VICODIN Take one half to one tab po q 6 hrs prn pain   ondansetron 8 MG tablet Commonly known as: ZOFRAN Take 4 mg by mouth 2 (two) times daily.   OVER THE COUNTER MEDICATION Nerve Tonic occasionally   OVER THE COUNTER MEDICATION Hyland's Leg Cramps PM fast and Effective four times per week.        Allergies:  Allergies  Allergen Reactions  Floxacillin (Flucloxacillin) Anaphylaxis    Family History  Problem Relation Age of Onset   Atrial fibrillation Mother    Macular degeneration Mother    Atrial fibrillation Father    Macular degeneration Father    Diabetes Brother    Seizures Brother    Pancreatic cancer Brother    Obesity Brother     Social History:  reports that he has been smoking cigarettes. He has a 50.00 pack-year smoking history. He has never used smokeless tobacco. He reports current alcohol use. He reports that he does not use drugs.  ROS: A complete review of systems was performed.  All  systems are negative except for pertinent findings as noted.  Physical Exam:  Vital signs in last 24 hours: There were no vitals taken for this visit. Constitutional:  Alert and oriented, No acute distress Cardiovascular: Regular rate  Respiratory: Normal respiratory effort GI: Abdomen is soft, nontender, nondistended, no abdominal masses. No CVAT.  Genitourinary: Normal male phallus, testes are descended bilaterally and non-tender and without masses, scrotum is normal in appearance without lesions or masses, perineum is normal on inspection. Lymphatic: No lymphadenopathy Neurologic: Grossly intact, no focal deficits Psychiatric: Normal mood and affect  I have reviewed prior pt notes  I have reviewed urinalysis results  I have independently reviewed prior imaging--renogram  I have reviewed prior PSA results    Impression/Assessment:  ***  Plan:  ***

## 2023-03-10 ENCOUNTER — Ambulatory Visit (INDEPENDENT_AMBULATORY_CARE_PROVIDER_SITE_OTHER): Payer: Medicare Other | Admitting: Urology

## 2023-03-10 ENCOUNTER — Encounter: Payer: Self-pay | Admitting: Urology

## 2023-03-10 VITALS — BP 129/76 | HR 91 | Ht 70.0 in | Wt 135.0 lb

## 2023-03-10 DIAGNOSIS — R9341 Abnormal radiologic findings on diagnostic imaging of renal pelvis, ureter, or bladder: Secondary | ICD-10-CM

## 2023-03-10 DIAGNOSIS — N281 Cyst of kidney, acquired: Secondary | ICD-10-CM

## 2023-03-10 DIAGNOSIS — N1330 Unspecified hydronephrosis: Secondary | ICD-10-CM

## 2023-03-10 DIAGNOSIS — N200 Calculus of kidney: Secondary | ICD-10-CM | POA: Diagnosis not present

## 2023-03-10 DIAGNOSIS — N210 Calculus in bladder: Secondary | ICD-10-CM

## 2023-03-10 DIAGNOSIS — N261 Atrophy of kidney (terminal): Secondary | ICD-10-CM

## 2023-03-24 ENCOUNTER — Ambulatory Visit: Payer: Medicare Other | Admitting: Urology

## 2023-03-26 ENCOUNTER — Inpatient Hospital Stay: Payer: Medicare Other | Attending: Physician Assistant

## 2023-03-26 DIAGNOSIS — D539 Nutritional anemia, unspecified: Secondary | ICD-10-CM

## 2023-03-26 DIAGNOSIS — E538 Deficiency of other specified B group vitamins: Secondary | ICD-10-CM

## 2023-03-26 DIAGNOSIS — F17210 Nicotine dependence, cigarettes, uncomplicated: Secondary | ICD-10-CM | POA: Diagnosis not present

## 2023-03-26 DIAGNOSIS — Z86711 Personal history of pulmonary embolism: Secondary | ICD-10-CM | POA: Insufficient documentation

## 2023-03-26 DIAGNOSIS — Z7901 Long term (current) use of anticoagulants: Secondary | ICD-10-CM | POA: Insufficient documentation

## 2023-03-26 DIAGNOSIS — D61818 Other pancytopenia: Secondary | ICD-10-CM | POA: Diagnosis present

## 2023-03-26 DIAGNOSIS — R7989 Other specified abnormal findings of blood chemistry: Secondary | ICD-10-CM | POA: Diagnosis not present

## 2023-03-26 DIAGNOSIS — D696 Thrombocytopenia, unspecified: Secondary | ICD-10-CM

## 2023-03-26 LAB — CBC WITH DIFFERENTIAL/PLATELET
Abs Immature Granulocytes: 0 10*3/uL (ref 0.00–0.07)
Basophils Absolute: 0 10*3/uL (ref 0.0–0.1)
Basophils Relative: 1 %
Eosinophils Absolute: 0 10*3/uL (ref 0.0–0.5)
Eosinophils Relative: 1 %
HCT: 37.2 % — ABNORMAL LOW (ref 39.0–52.0)
Hemoglobin: 12.4 g/dL — ABNORMAL LOW (ref 13.0–17.0)
Lymphocytes Relative: 19 %
Lymphs Abs: 0.6 10*3/uL — ABNORMAL LOW (ref 0.7–4.0)
MCH: 35.5 pg — ABNORMAL HIGH (ref 26.0–34.0)
MCHC: 33.3 g/dL (ref 30.0–36.0)
MCV: 106.6 fL — ABNORMAL HIGH (ref 80.0–100.0)
Monocytes Absolute: 0.4 10*3/uL (ref 0.1–1.0)
Monocytes Relative: 12 %
Neutro Abs: 2.1 10*3/uL (ref 1.7–7.7)
Neutrophils Relative %: 67 %
Platelets: 75 10*3/uL — ABNORMAL LOW (ref 150–400)
RBC: 3.49 MIL/uL — ABNORMAL LOW (ref 4.22–5.81)
RDW: 14.6 % (ref 11.5–15.5)
WBC: 3.1 10*3/uL — ABNORMAL LOW (ref 4.0–10.5)
nRBC: 0 % (ref 0.0–0.2)

## 2023-03-26 LAB — COMPREHENSIVE METABOLIC PANEL
ALT: 19 U/L (ref 0–44)
AST: 29 U/L (ref 15–41)
Albumin: 3.6 g/dL (ref 3.5–5.0)
Alkaline Phosphatase: 110 U/L (ref 38–126)
Anion gap: 7 (ref 5–15)
BUN: 11 mg/dL (ref 8–23)
CO2: 26 mmol/L (ref 22–32)
Calcium: 8.6 mg/dL — ABNORMAL LOW (ref 8.9–10.3)
Chloride: 104 mmol/L (ref 98–111)
Creatinine, Ser: 1.39 mg/dL — ABNORMAL HIGH (ref 0.61–1.24)
GFR, Estimated: 53 mL/min — ABNORMAL LOW (ref >60)
Glucose, Bld: 103 mg/dL — ABNORMAL HIGH (ref 70–99)
Potassium: 3.8 mmol/L (ref 3.5–5.1)
Sodium: 137 mmol/L (ref 135–145)
Total Bilirubin: 2.1 mg/dL — ABNORMAL HIGH (ref 0.3–1.2)
Total Protein: 6.2 g/dL — ABNORMAL LOW (ref 6.5–8.1)

## 2023-03-26 LAB — IRON AND TIBC
Iron: 250 ug/dL — ABNORMAL HIGH (ref 45–182)
Saturation Ratios: 70 % — ABNORMAL HIGH (ref 17.9–39.5)
TIBC: 357 ug/dL (ref 250–450)
UIBC: 107 ug/dL

## 2023-03-26 LAB — FOLATE: Folate: 25.9 ng/mL (ref >5.9)

## 2023-03-26 LAB — FERRITIN: Ferritin: 62 ng/mL (ref 24–336)

## 2023-03-26 LAB — LACTATE DEHYDROGENASE: LDH: 121 U/L (ref 98–192)

## 2023-03-26 LAB — VITAMIN B12: Vitamin B-12: 1471 pg/mL — ABNORMAL HIGH (ref 180–914)

## 2023-03-27 LAB — HOMOCYSTEINE: Homocysteine: 19.7 umol/L — ABNORMAL HIGH (ref 0.0–19.2)

## 2023-03-31 LAB — METHYLMALONIC ACID, SERUM: Methylmalonic Acid, Quantitative: 274 nmol/L (ref 0–378)

## 2023-04-01 NOTE — Progress Notes (Unsigned)
[facility] [address], [place] [address]   CLINIC:  Medical Oncology/Hematology  PCP:  Benita Stabile, MD [address] [name] [address] [phone]   REASON FOR VISIT:  Follow-up for pancytopenia and history of bilateral pulmonary embolism   CURRENT THERAPY: Eliquis 5 mg twice daily  INTERVAL HISTORY:   Brian Stark 77 y.o. male returns for routine follow-up of  thrombocytopenia and his history of pulmonary embolism.  He was last seen by Rojelio Brenner PA-C on 12/11/2022.   At today's visit, he reports feeling fair.  No recent hospitalizations, surgeries, or changes in baseline health status.   Regarding his thrombocytopenia, he has easy bruising but no petechial rash.  He denies any hemoptysis, epistaxis, hematochezia, melena, or hematemesis.   No B symptoms such as fever, chills, night sweats.  He has not had any recent infections.   Appetite has improved and his weight is slightly improved since his last visit.  He is drinking 1 Ensure daily.  He continues to take Eliquis for his history of PE/DVT.   He has not had any major bleeding events. 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   He has chronic dyspnea on exertion, but denies any acute changes in his respiratory status - if anything, his breathing is improved after starting Breztri inhaler.  No pleuritic chest pain or palpitations.  He has fallen once in the past year (May 2023).  He has been taking vitamin B12 and folic acid supplements since June 2023.   He has 50% energy and 50% appetite.  His weight has been stable.  ASSESSMENT & PLAN:  1.  Pancytopenia with macrocytosis - Moderate thrombocytopenia and macrocytic anemia since November 2019.  Development of mild leukopenia since January 2023. - No known history of liver disease - CT abdomen/pelvis (12/10/2022) showed normal-sized spleen  and unchanged calcification near the hepatic dome - Hematology workup revealed the following normal/negative results: TSH normal.  SPEP and hepatitis panel negative.  Reticulocytes 1.0 (hypoproliferative for degree of anemia).  LDH normal. - Most recent CBC (03/26/2023) shows persistent pancytopenia with WBC 3.1/ALC 0.6, Hgb 12.4/MCV 106.6, platelets 75.  CMP with creatinine 1.39/GFR 53.  Normal LDH. - Nutritional labs (03/26/2023) normalization of B12/MMA.  Normal folate, elevated (but improved) homocystine.  Ferritin 62, iron saturation 70%. - Admits to easy bruising, but denies any major bleeding episodes or petechial rash.  No bright red blood per rectum or melena.  No B symptoms. - Patient has been taking vitamin B12 and folic acid supplements since June 2023 - Differential diagnosis favors early myelodysplasia.  May have some effects of nutritional deficiency and anemia of CKD.  Less likely to be immune mediated thrombocytopenia or neutropenia.    - PLAN: Persistent but relatively stable pancytopenia. - Continue vitamin B12 500 mcg EVERY OTHER DAY (decreased frequency).  Continue folate 400 mcg daily. - Recommend continuing Eliquis (see below), but if platelets drop below 50, we will reassess risk/benefits. - Repeat labs (CBC/D, CMP LDH) and RTC in 4 months.  (We will check nutritional panel again in about 6 months, around November 2024) - Moderate suspicion for MDS.  Strongly recommended bone marrow biopsy to patient, but he refuses at this time - reports that he had a bone marrow biopsy in the 1970s, but he does not recall the reason or the results.  Apparently he had some  back pain after this and is hesitant to get another biopsy.  Patient wants to hold off on bone marrow biopsy until it is "absolutely and unavoidably necessary.")  We discussed that he may have MDS, and if so he has potential for leukemic transformation.  He continues to refuse bone marrow biopsy at this time.   2.  Bilateral  pulmonary embolisms/right leg DVT, recurrent, unprovoked - Hospital admission from 10/15/2019 through 10/17/2019 with hemoptysis. - CT chest on 10/16/2019 showed acute bilateral pulmonary emboli greater within the left lower lobe with no evidence of right heart strain.  Dopplers showed no evidence of DVT. - Eliquis was apparently discontinued in October 2021. - CT angio on 09/30/2020 shows acute PE in segmental and subsegmental branches supplying the right lower, right middle and left lower and left upper lobes. - He was found to have right leg DVT in November 2021 - " extensive right lower extremity DVT extending throughout femoral vein and deep femoral vein, through popliteal vein and into the calf" - Patient was started back on Eliquis in November 2021 after diagnosis of recurrent PE/DVT - He remains on Eliquis 5 mg twice daily, which he is tolerating well - No major bleeding episodes such as bright blood per rectum or melena. - ED visit for fall on 03/14/2022.  He denies any falls since that time. - Patient does have some post thrombotic changes of right lower extremity (intermittent right lower extremity edema and right ankle varicose veins), but does not have any acute signs or symptoms concerning for recurrent DVT or PE at this time - PLAN: Continue indefinite Eliquis.  No bleeding issues reported. - We discussed increased risk of  fall-associated bleeding including hemorrhagic CVA.  Discussed fall safety and prevention at home.  Patient wishes to continue Eliquis at this time, which is reasonable considering his high risk of recurrent VTE if he were to stop anticoagulation.   3.  Weight loss, RESOLVED - Patient had previously lost 15-20 pounds since December 2021, secondary to poor appetite - Appetite improved, weight is stable/improved - LDCT chest (October 2023) did not show any signs of active malignancy - CT scans of head, chest, and pelvis from ED visit on 03/14/2022 negative for any  signs of malignancy.    4.  Tobacco abuse - He has been smoking 1 PPD x50 years, continues to smoke 1 pack/day cigarettes - LDCT scan of chest (08/14/2022): Lung RADS 2S, benign appearance or behavior; recommended repeat LDCT chest in 12 months.  INCIDENTAL FINDINGS discussed with patient as below. Aortic atherosclerosis and LAD coronary artery disease. Findings consistent with COPD Probable left hydronephrosis, incompletely imaged.  (Note that mild left hydronephrosis was also seen on abdominal US 11/22/2021) - patient denies any urinary obstructive symptoms  - PLAN: We will schedule patient for repeat LDCT scan of chest in October 2024. - Recommend PCP follow-up for the above incidental findings   5.  Abnormal LFTs - US abdomen obtained for abnormal LFTs on 11/22/2021 during hospitalization was negative for any focal lesion or abnormalities in the liver or spleen. - CT abdomen/pelvis (12/10/2022) showed normal-sized spleen and unchanged calcification near the hepatic dome - Patient reports history of "liver stones" in the 1980s that required hospitalization and surgical removal - He reports that he has had "problems with his liver numbers" for the past 40+ years - Patient has not seen gastroenterology for "many years."  He was referred to gastroenterology following hospital discharge in January 2023. - Seen by gastroenterology  for elevated bilirubin which was felt to be "most consistent with Gilbert's syndrome per note by NP Lewie Loron on 01/07/2023)  PLAN SUMMARY: >> Labs in 4 months (CBC/D, CMP, LDH) >> OFFICE visit in 4 months, 1 week after labs     REVIEW OF SYSTEMS:  Review of Systems  Constitutional:  Positive for fatigue. Negative for appetite change, chills, diaphoresis and fever.  HENT:   Positive for trouble swallowing (no teeth). Negative for lump/mass and nosebleeds.   Eyes:  Negative for eye problems.  Respiratory:  Positive for shortness of breath (on exertion). Negative for  cough and hemoptysis.   Cardiovascular:  Negative for chest pain, leg swelling and palpitations.  Gastrointestinal:  Positive for constipation and nausea. Negative for abdominal pain, blood in stool, diarrhea and vomiting.  Genitourinary:  Negative for dysuria and hematuria.        Kidney stones  Skin: Negative.   Neurological:  Positive for dizziness and headaches (migraines). Negative for light-headedness.  Hematological:  Bruises/bleeds easily.  Psychiatric/Behavioral:  Positive for sleep disturbance. The patient is not nervous/anxious.      PHYSICAL EXAM:  ECOG PERFORMANCE STATUS: 2 - Symptomatic, <50% confined to bed  There were no vitals filed for this visit. There were no vitals filed for this visit. Physical Exam Constitutional:      Appearance: Normal appearance.     Comments: Cachexia appears improved on today's exam.  HENT:     Head: Normocephalic and atraumatic.     Mouth/Throat:     Mouth: Mucous membranes are moist.  Eyes:     Extraocular Movements: Extraocular movements intact.     Pupils: Pupils are equal, round, and reactive to light.  Cardiovascular:     Rate and Rhythm: Normal rate and regular rhythm.     Pulses: Normal pulses.     Heart sounds: Normal heart sounds.  Pulmonary:     Effort: Pulmonary effort is normal.     Breath sounds: Normal breath sounds. Decreased air movement present.  Abdominal:     General: Bowel sounds are normal.     Palpations: Abdomen is soft.     Tenderness: There is no abdominal tenderness.  Musculoskeletal:        General: No swelling.     Right lower leg: No edema.     Left lower leg: No edema.  Lymphadenopathy:     Cervical: No cervical adenopathy.  Skin:    General: Skin is warm and dry.  Neurological:     General: No focal deficit present.     Mental Status: He is alert and oriented to person, place, and time.  Psychiatric:        Mood and Affect: Mood normal.        Behavior: Behavior normal.     PAST  MEDICAL/SURGICAL HISTORY:  Past Medical History:  Diagnosis Date   Bursitis    l shoulder   Cataract    Elevated bilirubin    H/O: rheumatic fever    Kidney stones    Loss of teeth due to extraction    Migraines    Pulmonary embolism (HCC)    Rheumatic fever    Tendonitis    L shoulder   Past Surgical History:  Procedure Laterality Date   ABDOMINAL SURGERY     CATARACT EXTRACTION, BILATERAL     CHOLECYSTECTOMY     CRANIOTOMY Bilateral 09/06/2018   Procedure: BILATERAL CRANIOTOMIES FOR SUBDURAL HEMATOMA EVACUATION;  Surgeon: Maeola Harman, MD;  Location: [facility]  OR;  Service: Neurosurgery;  Laterality: Bilateral;   EYE SURGERY      SOCIAL HISTORY:  Social History   Socioeconomic History   Marital status: Widowed    Spouse name: Not on file   Number of children: Not on file   Years of education: Not on file   Highest education level: Not on file  Occupational History   Occupation: retired  Tobacco Use   Smoking status: Every Day    Packs/day: 1.00    Years: 50.00    Additional pack years: 0.00    Total pack years: 50.00    Types: Cigarettes   Smokeless tobacco: Never  Vaping Use   Vaping Use: Never used  Substance and Sexual Activity   Alcohol use: Yes    Comment: twice a week   Drug use: Never   Sexual activity: Not Currently  Other Topics Concern   Not on file  Social History Narrative   Not on file   Social Determinants of Health   Financial Resource Strain: Not on file  Food Insecurity: Not on file  Transportation Needs: Not on file  Physical Activity: Not on file  Stress: Not on file  Social Connections: Not on file  Intimate Partner Violence: Not on file    FAMILY HISTORY:  Family History  Problem Relation Age of Onset   Atrial fibrillation Mother    Macular degeneration Mother    Atrial fibrillation Father    Macular degeneration Father    Diabetes Brother    Seizures Brother    Pancreatic cancer Brother    Obesity Brother     CURRENT  MEDICATIONS:  Outpatient Encounter Medications as of 04/02/2023  Medication Sig   apixaban (ELIQUIS) 5 MG TABS tablet Take 2 tablets by mouth twice a day for 7 days; then start taking 1 tablet by mouth twice a day on daily basis. (Patient taking differently: Take 5 mg by mouth 2 (two) times daily.)   Budeson-Glycopyrrol-Formoterol (BREZTRI AEROSPHERE) 160-9-4.8 MCG/ACT AERO Inhale into the lungs. prn   cyclobenzaprine (FLEXERIL) 10 MG tablet Take 10 mg by mouth every 8 (eight) hours as needed.   folic acid (FOLVITE) 400 MCG tablet Take 1 tablet (400 mcg total) by mouth daily.   HYDROcodone-acetaminophen (NORCO/VICODIN) 5-325 MG tablet Take one half to one tab po q 6 hrs prn pain   ondansetron (ZOFRAN) 8 MG tablet Take 4 mg by mouth 2 (two) times daily.   OVER THE COUNTER MEDICATION Nerve Tonic occasionally   OVER THE COUNTER MEDICATION Hyland's Leg Cramps PM fast and Effective four times per week.   vitamin B-12 (CYANOCOBALAMIN) 500 MCG tablet Take 1 tablet (500 mcg total) by mouth daily. (Patient taking differently: Take 1,000 mcg by mouth daily.)   No facility-administered encounter medications on file as of 04/02/2023.    ALLERGIES:  Allergies  Allergen Reactions   Floxacillin (Flucloxacillin) Anaphylaxis    LABORATORY DATA:  I have reviewed the labs as listed.  CBC    Component Value Date/Time   WBC 3.1 (L) 03/26/2023 1417   RBC 3.49 (L) 03/26/2023 1417   HGB 12.4 (L) 03/26/2023 1417   HCT 37.2 (L) 03/26/2023 1417   PLT 75 (L) 03/26/2023 1417   MCV 106.6 (H) 03/26/2023 1417   MCH 35.5 (H) 03/26/2023 1417   MCHC 33.3 03/26/2023 1417   RDW 14.6 03/26/2023 1417   LYMPHSABS 0.6 (L) 03/26/2023 1417   MONOABS 0.4 03/26/2023 1417   EOSABS 0.0 03/26/2023 1417   BASOSABS  0.0 03/26/2023 1417      Latest Ref Rng & Units 03/26/2023    2:17 PM 01/07/2023    9:58 AM 12/04/2022    1:14 PM  CMP  Glucose 70 - 99 mg/dL 782   956   BUN 8 - 23 mg/dL 11   14   Creatinine 2.13 - 1.24 mg/dL  0.86   5.78   Sodium 469 - 145 mmol/L 137   138   Potassium 3.5 - 5.1 mmol/L 3.8   3.0   Chloride 98 - 111 mmol/L 104   104   CO2 22 - 32 mmol/L 26   26   Calcium 8.9 - 10.3 mg/dL 8.6   8.8   Total Protein 6.5 - 8.1 g/dL 6.2  6.3  6.3   Total Bilirubin 0.3 - 1.2 mg/dL 2.1  1.9  1.5   Alkaline Phos 38 - 126 U/L 110   103   AST 15 - 41 U/L 29  18  26    ALT 0 - 44 U/L 19  14  21      DIAGNOSTIC IMAGING:  I have independently reviewed the relevant imaging and discussed with the patient.   WRAP UP:  All questions were answered. The patient knows to call the clinic with any problems, questions or concerns.  Medical decision making: Moderate  Time spent on visit: I spent 20 minutes counseling the patient face to face. The total time spent in the appointment was 30 minutes and more than 50% was on counseling.  Carnella Guadalajara, PA-C  04/02/23 1:29 PM

## 2023-04-02 ENCOUNTER — Ambulatory Visit: Payer: Medicare Other | Admitting: Physician Assistant

## 2023-04-02 ENCOUNTER — Inpatient Hospital Stay (HOSPITAL_BASED_OUTPATIENT_CLINIC_OR_DEPARTMENT_OTHER): Payer: Medicare Other | Admitting: Physician Assistant

## 2023-04-02 VITALS — BP 131/88 | HR 78 | Temp 98.0°F | Resp 16 | Wt 138.0 lb

## 2023-04-02 DIAGNOSIS — D539 Nutritional anemia, unspecified: Secondary | ICD-10-CM

## 2023-04-02 DIAGNOSIS — D61818 Other pancytopenia: Secondary | ICD-10-CM

## 2023-04-02 DIAGNOSIS — Z86718 Personal history of other venous thrombosis and embolism: Secondary | ICD-10-CM

## 2023-04-02 DIAGNOSIS — Z7901 Long term (current) use of anticoagulants: Secondary | ICD-10-CM

## 2023-04-02 DIAGNOSIS — E538 Deficiency of other specified B group vitamins: Secondary | ICD-10-CM | POA: Diagnosis not present

## 2023-04-02 DIAGNOSIS — D696 Thrombocytopenia, unspecified: Secondary | ICD-10-CM

## 2023-04-02 NOTE — Patient Instructions (Signed)
[facility] at [facility] Discharge Instructions  You were seen today by Rojelio Brenner PA-C for your low blood cells and your history of blood clots in your legs/lungs.  LOW BLOOD COUNTS: You have low levels of all three main types of blood cells in your body (red blood cells, platelets, white blood cells).  I suspect that this may be caused by a type of bone marrow cancer called "MDS" or myelodysplastic syndrome.   I recommend BONE MARROW BIOPSY for further evaluation, but respect your choice to not have that test at this time. We will continue to monitor your blood counts closely, as MDS can sometimes turn into a type of blood cancer called leukemia.  VITAMIN DEFICIENCIES: Your labs showed improved vitamin B12 and folate.  You should continue your B12 and folic acid supplement.  HISTORY OF BLOOD CLOTS: Continue Eliquis indefinitely for prevention of recurrent blood clots.  Seek immediate medical attention if you experience any signs of major bleeding events.  Be careful not to fall at home!  WEIGHT LOSS: Your weight looks better.  Continue to drink at least 1 Ensure beverage per day.   MEDICATIONS: - Continue Eliquis - Continue vitamin B12 (cyanocobalamin) 500 mcg EVERY OTHER DAY - Continue folic acid 400 mcg daily  FOLLOW-UP APPOINTMENT: Office visit in 4 months, with labs 1 week before  ** Thank you for trusting me with your healthcare!  I strive to provide all of my patients with quality care at each visit.  If you receive a survey for this visit, I would be so grateful to you for taking the time to provide feedback.  Thank you in advance!  ~ Ionna Avis                   Dr. Doreatha Massed   &   Rojelio Brenner, PA-C   - - - - - - - - - - - - - - - - - -     Thank you for choosing [facility] at [facility] to provide your oncology and hematology care.  To afford each patient quality time with our provider, please arrive at  least 15 minutes before your scheduled appointment time.   If you have a lab appointment with the Cancer Center please come in thru the Main Entrance and check in at the main information desk.  You need to re-schedule your appointment should you arrive 10 or more minutes late.  We strive to give you quality time with our providers, and arriving late affects you and other patients whose appointments are after yours.  Also, if you no show three or more times for appointments you may be dismissed from the clinic at the providers discretion.     Again, thank you for choosing [facility].  Our hope is that these requests will decrease the amount of time that you wait before being seen by our physicians.       _____________________________________________________________  Should you have questions after your visit to [facility], please contact our office at [phone] and follow the prompts.  Our office hours are 8:00 a.m. and 4:30 p.m. Monday - Friday.  Please note that voicemails left after 4:00 p.m. may not be returned until the following business day.  We are closed weekends and major holidays.  You do have access to a nurse 24-7, just call the main number to the clinic [phone] and do not press  any options, hold on the line and a nurse will answer the phone.    For prescription refill requests, have your pharmacy contact our office and allow 72 hours.    Due to Covid, you will need to wear a mask upon entering the hospital. If you do not have a mask, a mask will be given to you at the Main Entrance upon arrival. For doctor visits, patients may have 1 support person age 56 or older with them. For treatment visits, patients can not have anyone with them due to social distancing guidelines and our immunocompromised population.

## 2023-07-29 ENCOUNTER — Inpatient Hospital Stay: Payer: Medicare Other | Attending: Physician Assistant

## 2023-07-29 DIAGNOSIS — D61818 Other pancytopenia: Secondary | ICD-10-CM | POA: Insufficient documentation

## 2023-07-29 LAB — CBC WITH DIFFERENTIAL/PLATELET
Abs Immature Granulocytes: 0.01 10*3/uL (ref 0.00–0.07)
Basophils Absolute: 0 10*3/uL (ref 0.0–0.1)
Basophils Relative: 1 %
Eosinophils Absolute: 0.1 10*3/uL (ref 0.0–0.5)
Eosinophils Relative: 3 %
HCT: 39.9 % (ref 39.0–52.0)
Hemoglobin: 12.9 g/dL — ABNORMAL LOW (ref 13.0–17.0)
Immature Granulocytes: 0 %
Lymphocytes Relative: 30 %
Lymphs Abs: 1 10*3/uL (ref 0.7–4.0)
MCH: 35 pg — ABNORMAL HIGH (ref 26.0–34.0)
MCHC: 32.3 g/dL (ref 30.0–36.0)
MCV: 108.1 fL — ABNORMAL HIGH (ref 80.0–100.0)
Monocytes Absolute: 0.4 10*3/uL (ref 0.1–1.0)
Monocytes Relative: 12 %
Neutro Abs: 1.7 10*3/uL (ref 1.7–7.7)
Neutrophils Relative %: 54 %
Platelets: 87 10*3/uL — ABNORMAL LOW (ref 150–400)
RBC: 3.69 MIL/uL — ABNORMAL LOW (ref 4.22–5.81)
RDW: 14.2 % (ref 11.5–15.5)
WBC: 3.2 10*3/uL — ABNORMAL LOW (ref 4.0–10.5)
nRBC: 0 % (ref 0.0–0.2)

## 2023-07-29 LAB — COMPREHENSIVE METABOLIC PANEL
ALT: 18 U/L (ref 0–44)
AST: 24 U/L (ref 15–41)
Albumin: 3.8 g/dL (ref 3.5–5.0)
Alkaline Phosphatase: 93 U/L (ref 38–126)
Anion gap: 6 (ref 5–15)
BUN: 10 mg/dL (ref 8–23)
CO2: 27 mmol/L (ref 22–32)
Calcium: 8.9 mg/dL (ref 8.9–10.3)
Chloride: 105 mmol/L (ref 98–111)
Creatinine, Ser: 1.21 mg/dL (ref 0.61–1.24)
GFR, Estimated: >60 mL/min (ref >60)
Glucose, Bld: 96 mg/dL (ref 70–99)
Potassium: 4 mmol/L (ref 3.5–5.1)
Sodium: 138 mmol/L (ref 135–145)
Total Bilirubin: 1 mg/dL (ref 0.3–1.2)
Total Protein: 6.5 g/dL (ref 6.5–8.1)

## 2023-07-29 LAB — LACTATE DEHYDROGENASE: LDH: 118 U/L (ref 98–192)

## 2023-08-04 NOTE — Progress Notes (Unsigned)
[facility] [address], [place] [address]   CLINIC:  Medical Oncology/Hematology  PCP:  Benita Stabile, MD [address] [name] [address] [phone]   REASON FOR VISIT:  Follow-up for pancytopenia and history of bilateral pulmonary embolism   CURRENT THERAPY: Eliquis 5 mg twice daily  INTERVAL HISTORY:   Mr. Stfort 77 y.o. male returns for routine follow-up of  thrombocytopenia and his history of pulmonary embolism.  He was last seen by Rojelio Brenner PA-C on 04/02/2023.   At today's visit, he reports feeling fair.  No recent hospitalizations, surgeries, or changes in baseline health status.   Regarding his thrombocytopenia, he has easy bruising but no petechial rash.  He denies any hemoptysis, epistaxis, hematochezia, melena, or hematemesis.  He has mild night sweats once a week, but no other B symptoms such as fever, chills.  He has not had any recent infections.   Appetite has improved and his weight is stable since his last visit.  He is drinking 1 Ensure daily.  He continues to take Eliquis for his history of PE/DVT.    He has not had any major bleeding events.  He has not had any falls since he fell in May 2023.  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  He has chronic dyspnea on exertion, but denies any acute changes in his respiratory status - breathing is improved after starting Breztri inhaler.  No pleuritic chest pain or palpitations.  He has been taking vitamin B12 every other day and folic acid daily supplements since June 2023.   He has 25% energy and 70% appetite.    ASSESSMENT & PLAN:  1.  Pancytopenia with macrocytosis - Moderate thrombocytopenia and macrocytic anemia since November 2019.  Development of mild leukopenia since January 2023. - No known history of liver disease - CT abdomen/pelvis (12/10/2022) showed normal-sized  spleen and unchanged calcification near the hepatic dome - Hematology workup revealed the following normal/negative results: TSH normal.  SPEP and hepatitis panel negative.  Reticulocytes 1.0 (hypoproliferative for degree of anemia).  LDH normal. - Most recent CBC (07/29/2023) shows persistent pancytopenia with WBC 3.2/normal differential, Hgb 12.9/MCV 108.1, platelets 87.  CMP with creatinine 1.21/GFR >60.  Normal LDH. - Nutritional labs (03/26/2023) normalization of B12/MMA.  Normal folate, elevated (but improved) homocystine.  Ferritin 62, iron saturation 70%. - Admits to easy bruising, but denies any major bleeding episodes or petechial rash.  No bright red blood per rectum or melena.  No B symptoms. - Patient has been taking vitamin B12 and folic acid supplements since June 2023 - Differential diagnosis favors early myelodysplasia.  May have some effects of nutritional deficiency and anemia of CKD.  Less likely to be immune mediated thrombocytopenia or neutropenia.    - PLAN: Persistent but relatively stable pancytopenia. - Continue vitamin B12 500 mcg EVERY OTHER DAY (decreased frequency).  Continue folate 400 mcg daily. - Recommend continuing Eliquis (see below), but if platelets drop below 50, we will reassess risk/benefits. - Repeat labs (CBC/D, CMP, LDH, B12, folate, homocystine, MMA, ferritin, iron/TIBC) and RTC in 4 months.   - Moderate suspicion for MDS.  Recommended bone marrow biopsy to patient, but he refuses at this time - reports that he had a bone marrow biopsy in the 1970s, but he does not recall the reason or the results.  Apparently he had some back  pain after this and is hesitant to get another biopsy.  Patient wants to hold off on bone marrow biopsy until it is "absolutely and unavoidably necessary.")  We discussed that he may have MDS, and if so he has potential for leukemic transformation.  He continues to refuse bone marrow biopsy at this time.   2.  Bilateral pulmonary  embolisms/right leg DVT, recurrent, unprovoked - Hospital admission from 10/15/2019 through 10/17/2019 with hemoptysis. - CT chest on 10/16/2019 showed acute bilateral pulmonary emboli greater within the left lower lobe with no evidence of right heart strain.  Dopplers showed no evidence of DVT. - Eliquis was apparently discontinued in October 2021. - CT angio on 09/30/2020 shows acute PE in segmental and subsegmental branches supplying the right lower, right middle and left lower and left upper lobes. - He was found to have right leg DVT in November 2021 - " extensive right lower extremity DVT extending throughout femoral vein and deep femoral vein, through popliteal vein and into the calf" - Patient was started back on Eliquis in November 2021 after diagnosis of recurrent PE/DVT - He remains on Eliquis 5 mg twice daily, which he is tolerating well - No major bleeding episodes such as bright blood per rectum or melena. - ED visit for fall on 03/14/2022.  He denies any falls since that time. - Patient does have some post thrombotic changes of right lower extremity (intermittent right lower extremity edema and right ankle varicose veins), but does not have any acute signs or symptoms concerning for recurrent DVT or PE at this time - PLAN: Continue indefinite Eliquis.  No bleeding issues reported. - We discussed increased risk of  fall-associated bleeding including hemorrhagic CVA.  Discussed fall safety and prevention at home.  Patient wishes to continue Eliquis at this time, which is reasonable considering his high risk of recurrent VTE if he were to stop anticoagulation.   3.  Weight loss, RESOLVED - Patient had previously lost 15-20 pounds since December 2021, secondary to poor appetite - Appetite improved, weight is stable/improved - LDCT chest (October 2023) did not show any signs of active malignancy - CT scans of head, chest, and pelvis from ED visit on 03/14/2022 negative for any signs of  malignancy.    4.  Tobacco abuse - He has been smoking 1 PPD x50 years, continues to smoke 1 pack/day cigarettes - LDCT scan of chest (08/14/2022): Lung RADS 2S, benign appearance or behavior; recommended repeat LDCT chest in 12 months.  INCIDENTAL FINDINGS discussed with patient as below. Aortic atherosclerosis and LAD coronary artery disease. Findings consistent with COPD Probable left hydronephrosis, incompletely imaged.  (Note that mild left hydronephrosis was also seen on abdominal US 11/22/2021) - patient denies any urinary obstructive symptoms  - PLAN: We will schedule patient for repeat LDCT scan of chest in October 2024. - Recommend PCP follow-up for the above incidental findings   5.  Abnormal LFTs - US abdomen obtained for abnormal LFTs on 11/22/2021 during hospitalization was negative for any focal lesion or abnormalities in the liver or spleen. - CT abdomen/pelvis (12/10/2022) showed normal-sized spleen and unchanged calcification near the hepatic dome - Patient reports history of "liver stones" in the 1980s that required hospitalization and surgical removal - He reports that he has had "problems with his liver numbers" for the past 40+ years - Patient has not seen gastroenterology for "many years."  He was referred to gastroenterology following hospital discharge in January 2023. - Seen by gastroenterology for  elevated bilirubin which was felt to be "most consistent with Gilbert's syndrome per note by NP Lewie Loron on 01/07/2023)  PLAN SUMMARY: >> LDCT chest due in October 2024 >> Labs in 4 months (CBC/D, CMP, LDH, B12, folate, homocystine, MMA, ferritin, iron/TIBC) >> OFFICE visit in 4 months, 1 week after labs     REVIEW OF SYSTEMS:  Review of Systems  Constitutional:  Positive for fatigue. Negative for appetite change, chills, diaphoresis and fever.  HENT:   Positive for trouble swallowing (no teeth). Negative for lump/mass and nosebleeds.   Eyes:  Negative for eye problems.   Respiratory:  Positive for shortness of breath (on exertion). Negative for cough and hemoptysis.   Cardiovascular:  Negative for chest pain, leg swelling and palpitations.  Gastrointestinal:  Positive for nausea. Negative for abdominal pain, blood in stool, constipation, diarrhea and vomiting.  Genitourinary:  Positive for frequency and nocturia. Negative for dysuria and hematuria.        Kidney stones  Skin: Negative.   Neurological:  Positive for dizziness and headaches (migraines). Negative for light-headedness.  Hematological:  Bruises/bleeds easily.  Psychiatric/Behavioral:  Positive for sleep disturbance. The patient is not nervous/anxious.      PHYSICAL EXAM:  ECOG PERFORMANCE STATUS: 2 - Symptomatic, <50% confined to bed  Vitals:   08/05/23 1325  BP: 136/81  Pulse: 87  Resp: 18  Temp: 98.6 F (37 C)  SpO2: 100%   Filed Weights   08/05/23 1325  Weight: 137 lb 3.2 oz (62.2 kg)   Physical Exam Constitutional:      Appearance: Normal appearance.     Comments: Cachexia appears improved on today's exam.  HENT:     Head: Normocephalic and atraumatic.     Mouth/Throat:     Mouth: Mucous membranes are moist.  Eyes:     Extraocular Movements: Extraocular movements intact.     Pupils: Pupils are equal, round, and reactive to light.  Cardiovascular:     Rate and Rhythm: Normal rate and regular rhythm.     Pulses: Normal pulses.     Heart sounds: Normal heart sounds.  Pulmonary:     Effort: Pulmonary effort is normal.     Breath sounds: Normal breath sounds. Decreased air movement present.  Abdominal:     General: Bowel sounds are normal.     Palpations: Abdomen is soft.     Tenderness: There is no abdominal tenderness.  Musculoskeletal:        General: No swelling.     Right lower leg: No edema.     Left lower leg: No edema.  Lymphadenopathy:     Cervical: No cervical adenopathy.  Skin:    General: Skin is warm and dry.  Neurological:     General: No focal  deficit present.     Mental Status: He is alert and oriented to person, place, and time.  Psychiatric:        Mood and Affect: Mood normal.        Behavior: Behavior normal.     PAST MEDICAL/SURGICAL HISTORY:  Past Medical History:  Diagnosis Date   Bursitis    l shoulder   Cataract    Elevated bilirubin    H/O: rheumatic fever    Kidney stones    Loss of teeth due to extraction    Migraines    Pulmonary embolism (HCC)    Rheumatic fever    Tendonitis    L shoulder   Past Surgical History:  Procedure Laterality  Date   ABDOMINAL SURGERY     CATARACT EXTRACTION, BILATERAL     CHOLECYSTECTOMY     CRANIOTOMY Bilateral 09/06/2018   Procedure: BILATERAL CRANIOTOMIES FOR SUBDURAL HEMATOMA EVACUATION;  Surgeon: Maeola Harman, MD;  Location: [facility] OR;  Service: Neurosurgery;  Laterality: Bilateral;   EYE SURGERY      SOCIAL HISTORY:  Social History   Socioeconomic History   Marital status: Widowed    Spouse name: Not on file   Number of children: Not on file   Years of education: Not on file   Highest education level: Not on file  Occupational History   Occupation: retired  Tobacco Use   Smoking status: Every Day    Current packs/day: 1.00    Average packs/day: 1 pack/day for 50.0 years (50.0 ttl pk-yrs)    Types: Cigarettes   Smokeless tobacco: Never  Vaping Use   Vaping status: Never Used  Substance and Sexual Activity   Alcohol use: Yes    Comment: twice a week   Drug use: Never   Sexual activity: Not Currently  Other Topics Concern   Not on file  Social History Narrative   Not on file   Social Determinants of Health   Financial Resource Strain: Not on file  Food Insecurity: Not on file  Transportation Needs: Not on file  Physical Activity: Not on file  Stress: Not on file  Social Connections: Not on file  Intimate Partner Violence: Not on file    FAMILY HISTORY:  Family History  Problem Relation Age of Onset   Atrial fibrillation Mother    Macular  degeneration Mother    Atrial fibrillation Father    Macular degeneration Father    Diabetes Brother    Seizures Brother    Pancreatic cancer Brother    Obesity Brother     CURRENT MEDICATIONS:  Outpatient Encounter Medications as of 08/05/2023  Medication Sig   apixaban (ELIQUIS) 5 MG TABS tablet Take 1 tablet (5 mg total) by mouth 2 (two) times daily.   Budeson-Glycopyrrol-Formoterol (BREZTRI AEROSPHERE) 160-9-4.8 MCG/ACT AERO Inhale into the lungs. prn   cyclobenzaprine (FLEXERIL) 10 MG tablet Take 10 mg by mouth every 8 (eight) hours as needed.   folic acid (FOLVITE) 400 MCG tablet Take 1 tablet (400 mcg total) by mouth daily.   HYDROcodone-acetaminophen (NORCO/VICODIN) 5-325 MG tablet Take one half to one tab po q 6 hrs prn pain   ondansetron (ZOFRAN) 8 MG tablet Take 4 mg by mouth 2 (two) times daily.   OVER THE COUNTER MEDICATION Nerve Tonic occasionally   OVER THE COUNTER MEDICATION Hyland's Leg Cramps PM fast and Effective four times per week.   vitamin B-12 (CYANOCOBALAMIN) 500 MCG tablet Take 1 tablet (500 mcg total) by mouth daily. (Patient taking differently: Take 1,000 mcg by mouth daily.)   [DISCONTINUED] apixaban (ELIQUIS) 5 MG TABS tablet Take 2 tablets by mouth twice a day for 7 days; then start taking 1 tablet by mouth twice a day on daily basis. (Patient taking differently: Take 5 mg by mouth 2 (two) times daily.)   No facility-administered encounter medications on file as of 08/05/2023.    ALLERGIES:  Allergies  Allergen Reactions   Floxacillin (Flucloxacillin) Anaphylaxis    LABORATORY DATA:  I have reviewed the labs as listed.  CBC    Component Value Date/Time   WBC 3.2 (L) 07/29/2023 1312   RBC 3.69 (L) 07/29/2023 1312   HGB 12.9 (L) 07/29/2023 1312   HCT 39.9 07/29/2023  1312   PLT 87 (L) 07/29/2023 1312   MCV 108.1 (H) 07/29/2023 1312   MCH 35.0 (H) 07/29/2023 1312   MCHC 32.3 07/29/2023 1312   RDW 14.2 07/29/2023 1312   LYMPHSABS 1.0 07/29/2023  1312   MONOABS 0.4 07/29/2023 1312   EOSABS 0.1 07/29/2023 1312   BASOSABS 0.0 07/29/2023 1312      Latest Ref Rng & Units 07/29/2023    1:12 PM 03/26/2023    2:17 PM 01/07/2023    9:58 AM  CMP  Glucose 70 - 99 mg/dL 96  829    BUN 8 - 23 mg/dL 10  11    Creatinine 5.62 - 1.24 mg/dL 1.30  8.65    Sodium 784 - 145 mmol/L 138  137    Potassium 3.5 - 5.1 mmol/L 4.0  3.8    Chloride 98 - 111 mmol/L 105  104    CO2 22 - 32 mmol/L 27  26    Calcium 8.9 - 10.3 mg/dL 8.9  8.6    Total Protein 6.5 - 8.1 g/dL 6.5  6.2  6.3   Total Bilirubin 0.3 - 1.2 mg/dL 1.0  2.1  1.9   Alkaline Phos 38 - 126 U/L 93  110    AST 15 - 41 U/L 24  29  18    ALT 0 - 44 U/L 18  19  14      DIAGNOSTIC IMAGING:  I have independently reviewed the relevant imaging and discussed with the patient.   WRAP UP:  All questions were answered. The patient knows to call the clinic with any problems, questions or concerns.  Medical decision making: Moderate  Time spent on visit: I spent 20 minutes counseling the patient face to face. The total time spent in the appointment was 30 minutes and more than 50% was on counseling.  Carnella Guadalajara, PA-C  08/05/23 2:18 PM

## 2023-08-05 ENCOUNTER — Inpatient Hospital Stay: Payer: Medicare Other | Attending: Physician Assistant | Admitting: Physician Assistant

## 2023-08-05 VITALS — BP 136/81 | HR 87 | Temp 98.6°F | Resp 18 | Ht 70.0 in | Wt 137.2 lb

## 2023-08-05 DIAGNOSIS — Z8349 Family history of other endocrine, nutritional and metabolic diseases: Secondary | ICD-10-CM | POA: Insufficient documentation

## 2023-08-05 DIAGNOSIS — I2510 Atherosclerotic heart disease of native coronary artery without angina pectoris: Secondary | ICD-10-CM | POA: Diagnosis not present

## 2023-08-05 DIAGNOSIS — I82501 Chronic embolism and thrombosis of unspecified deep veins of right lower extremity: Secondary | ICD-10-CM | POA: Diagnosis not present

## 2023-08-05 DIAGNOSIS — R7989 Other specified abnormal findings of blood chemistry: Secondary | ICD-10-CM | POA: Diagnosis not present

## 2023-08-05 DIAGNOSIS — R600 Localized edema: Secondary | ICD-10-CM | POA: Diagnosis not present

## 2023-08-05 DIAGNOSIS — Z7901 Long term (current) use of anticoagulants: Secondary | ICD-10-CM | POA: Insufficient documentation

## 2023-08-05 DIAGNOSIS — Z79899 Other long term (current) drug therapy: Secondary | ICD-10-CM | POA: Diagnosis not present

## 2023-08-05 DIAGNOSIS — I82411 Acute embolism and thrombosis of right femoral vein: Secondary | ICD-10-CM | POA: Diagnosis not present

## 2023-08-05 DIAGNOSIS — I2699 Other pulmonary embolism without acute cor pulmonale: Secondary | ICD-10-CM | POA: Diagnosis not present

## 2023-08-05 DIAGNOSIS — Z833 Family history of diabetes mellitus: Secondary | ICD-10-CM | POA: Insufficient documentation

## 2023-08-05 DIAGNOSIS — R042 Hemoptysis: Secondary | ICD-10-CM | POA: Diagnosis not present

## 2023-08-05 DIAGNOSIS — Z83518 Family history of other specified eye disorder: Secondary | ICD-10-CM | POA: Diagnosis not present

## 2023-08-05 DIAGNOSIS — Z880 Allergy status to penicillin: Secondary | ICD-10-CM | POA: Insufficient documentation

## 2023-08-05 DIAGNOSIS — R61 Generalized hyperhidrosis: Secondary | ICD-10-CM | POA: Diagnosis not present

## 2023-08-05 DIAGNOSIS — I2694 Multiple subsegmental pulmonary emboli without acute cor pulmonale: Secondary | ICD-10-CM

## 2023-08-05 DIAGNOSIS — Z87891 Personal history of nicotine dependence: Secondary | ICD-10-CM

## 2023-08-05 DIAGNOSIS — Z122 Encounter for screening for malignant neoplasm of respiratory organs: Secondary | ICD-10-CM | POA: Diagnosis not present

## 2023-08-05 DIAGNOSIS — D539 Nutritional anemia, unspecified: Secondary | ICD-10-CM | POA: Diagnosis not present

## 2023-08-05 DIAGNOSIS — Z86711 Personal history of pulmonary embolism: Secondary | ICD-10-CM | POA: Insufficient documentation

## 2023-08-05 DIAGNOSIS — Z8249 Family history of ischemic heart disease and other diseases of the circulatory system: Secondary | ICD-10-CM | POA: Insufficient documentation

## 2023-08-05 DIAGNOSIS — Z86718 Personal history of other venous thrombosis and embolism: Secondary | ICD-10-CM | POA: Diagnosis not present

## 2023-08-05 DIAGNOSIS — F17210 Nicotine dependence, cigarettes, uncomplicated: Secondary | ICD-10-CM | POA: Insufficient documentation

## 2023-08-05 DIAGNOSIS — Z820 Family history of epilepsy and other diseases of the nervous system: Secondary | ICD-10-CM | POA: Insufficient documentation

## 2023-08-05 DIAGNOSIS — D7589 Other specified diseases of blood and blood-forming organs: Secondary | ICD-10-CM | POA: Insufficient documentation

## 2023-08-05 DIAGNOSIS — Z9049 Acquired absence of other specified parts of digestive tract: Secondary | ICD-10-CM | POA: Diagnosis not present

## 2023-08-05 DIAGNOSIS — D61818 Other pancytopenia: Secondary | ICD-10-CM | POA: Diagnosis present

## 2023-08-05 DIAGNOSIS — D696 Thrombocytopenia, unspecified: Secondary | ICD-10-CM

## 2023-08-05 DIAGNOSIS — I700 Atherosclerosis of aorta: Secondary | ICD-10-CM | POA: Insufficient documentation

## 2023-08-05 DIAGNOSIS — Z800 Family history of malignant neoplasm of digestive organs: Secondary | ICD-10-CM | POA: Diagnosis not present

## 2023-08-05 DIAGNOSIS — E538 Deficiency of other specified B group vitamins: Secondary | ICD-10-CM

## 2023-08-05 MED ORDER — APIXABAN 5 MG PO TABS
5.0000 mg | ORAL_TABLET | Freq: Two times a day (BID) | ORAL | 11 refills | Status: AC
Start: 2023-08-05 — End: ?

## 2023-08-05 NOTE — Patient Instructions (Signed)
[facility] at [facility] Discharge Instructions  You were seen today by Rojelio Brenner PA-C for your low blood cells and your history of blood clots in your legs/lungs.  LOW BLOOD COUNTS: You have low levels of all three main types of blood cells in your body (red blood cells, platelets, white blood cells).  I suspect that this may be caused by a type of bone marrow cancer called "MDS" or myelodysplastic syndrome.   I recommend BONE MARROW BIOPSY for further evaluation, but respect your choice to not have that test at this time. We will continue to monitor your blood counts closely, as MDS can sometimes turn into a type of blood cancer called leukemia.  VITAMIN DEFICIENCIES: Your labs showed improved vitamin B12 and folate.  You should continue your B12 and folic acid supplement.  HISTORY OF BLOOD CLOTS: Continue Eliquis indefinitely for prevention of recurrent blood clots.  Seek immediate medical attention if you experience any signs of major bleeding events.  Be careful not to fall at home!  WEIGHT LOSS: Your weight looks better.  Continue to drink at least 1 Ensure beverage per day.   MEDICATIONS: - Continue Eliquis - Continue vitamin B12 (cyanocobalamin) 500 mcg EVERY OTHER DAY - Continue folic acid 400 mcg daily  FOLLOW-UP APPOINTMENT: Office visit in 4 months, with labs 1 week before  ** Thank you for trusting me with your healthcare!  I strive to provide all of my patients with quality care at each visit.  If you receive a survey for this visit, I would be so grateful to you for taking the time to provide feedback.  Thank you in advance!  ~ Ionna Avis                   Dr. Doreatha Massed   &   Rojelio Brenner, PA-C   - - - - - - - - - - - - - - - - - -     Thank you for choosing [facility] at [facility] to provide your oncology and hematology care.  To afford each patient quality time with our provider, please arrive at  least 15 minutes before your scheduled appointment time.   If you have a lab appointment with the Cancer Center please come in thru the Main Entrance and check in at the main information desk.  You need to re-schedule your appointment should you arrive 10 or more minutes late.  We strive to give you quality time with our providers, and arriving late affects you and other patients whose appointments are after yours.  Also, if you no show three or more times for appointments you may be dismissed from the clinic at the providers discretion.     Again, thank you for choosing [facility].  Our hope is that these requests will decrease the amount of time that you wait before being seen by our physicians.       _____________________________________________________________  Should you have questions after your visit to [facility], please contact our office at [phone] and follow the prompts.  Our office hours are 8:00 a.m. and 4:30 p.m. Monday - Friday.  Please note that voicemails left after 4:00 p.m. may not be returned until the following business day.  We are closed weekends and major holidays.  You do have access to a nurse 24-7, just call the main number to the clinic [phone] and do not press  any options, hold on the line and a nurse will answer the phone.    For prescription refill requests, have your pharmacy contact our office and allow 72 hours.    Due to Covid, you will need to wear a mask upon entering the hospital. If you do not have a mask, a mask will be given to you at the Main Entrance upon arrival. For doctor visits, patients may have 1 support person age 56 or older with them. For treatment visits, patients can not have anyone with them due to social distancing guidelines and our immunocompromised population.

## 2023-09-11 ENCOUNTER — Ambulatory Visit (HOSPITAL_COMMUNITY)
Admission: RE | Admit: 2023-09-11 | Discharge: 2023-09-11 | Disposition: A | Discharge status: Home / Self Care | Payer: Medicare Other | Source: Ambulatory Visit | Attending: Physician Assistant | Admitting: Physician Assistant

## 2023-09-11 DIAGNOSIS — Z87891 Personal history of nicotine dependence: Secondary | ICD-10-CM | POA: Diagnosis present

## 2023-09-11 DIAGNOSIS — Z122 Encounter for screening for malignant neoplasm of respiratory organs: Secondary | ICD-10-CM

## 2023-09-21 ENCOUNTER — Encounter (HOSPITAL_COMMUNITY): Payer: Self-pay

## 2023-09-21 ENCOUNTER — Other Ambulatory Visit: Payer: Self-pay

## 2023-09-21 ENCOUNTER — Emergency Department (HOSPITAL_COMMUNITY)
Admission: EM | Admit: 2023-09-21 | Discharge: 2023-09-22 | Disposition: A | Discharge status: Home / Self Care | Payer: Medicare Other | Attending: Emergency Medicine | Admitting: Emergency Medicine

## 2023-09-21 DIAGNOSIS — Z7901 Long term (current) use of anticoagulants: Secondary | ICD-10-CM | POA: Diagnosis not present

## 2023-09-21 DIAGNOSIS — K859 Acute pancreatitis without necrosis or infection, unspecified: Secondary | ICD-10-CM | POA: Insufficient documentation

## 2023-09-21 DIAGNOSIS — R109 Unspecified abdominal pain: Secondary | ICD-10-CM | POA: Diagnosis present

## 2023-09-21 LAB — BASIC METABOLIC PANEL
Anion gap: 8 (ref 5–15)
BUN: 8 mg/dL (ref 8–23)
CO2: 28 mmol/L (ref 22–32)
Calcium: 10.1 mg/dL (ref 8.9–10.3)
Chloride: 102 mmol/L (ref 98–111)
Creatinine, Ser: 1.28 mg/dL — ABNORMAL HIGH (ref 0.61–1.24)
GFR, Estimated: 58 mL/min — ABNORMAL LOW (ref >60)
Glucose, Bld: 134 mg/dL — ABNORMAL HIGH (ref 70–99)
Potassium: 3.4 mmol/L — ABNORMAL LOW (ref 3.5–5.1)
Sodium: 138 mmol/L (ref 135–145)

## 2023-09-21 LAB — URINALYSIS, ROUTINE W REFLEX MICROSCOPIC
Glucose, UA: NEGATIVE mg/dL
Hgb urine dipstick: NEGATIVE
Ketones, ur: NEGATIVE mg/dL
Leukocytes,Ua: NEGATIVE
Nitrite: NEGATIVE
Protein, ur: NEGATIVE mg/dL
Specific Gravity, Urine: 1.014 (ref 1.005–1.030)
pH: 5 (ref 5.0–8.0)

## 2023-09-21 LAB — CBC
HCT: 39.5 % (ref 39.0–52.0)
Hemoglobin: 13.3 g/dL (ref 13.0–17.0)
MCH: 35.9 pg — ABNORMAL HIGH (ref 26.0–34.0)
MCHC: 33.7 g/dL (ref 30.0–36.0)
MCV: 106.8 fL — ABNORMAL HIGH (ref 80.0–100.0)
Platelets: 60 10*3/uL — ABNORMAL LOW (ref 150–400)
RBC: 3.7 MIL/uL — ABNORMAL LOW (ref 4.22–5.81)
RDW: 14.5 % (ref 11.5–15.5)
WBC: 5.1 10*3/uL (ref 4.0–10.5)
nRBC: 0 % (ref 0.0–0.2)

## 2023-09-21 LAB — LIPASE, BLOOD: Lipase: 65 U/L — ABNORMAL HIGH (ref 11–51)

## 2023-09-21 MED ORDER — ONDANSETRON 4 MG PO TBDP
4.0000 mg | ORAL_TABLET | Freq: Once | ORAL | Status: AC | PRN
  Administered 2023-09-21: 4 mg via ORAL
  Filled 2023-09-21: qty 1

## 2023-09-21 NOTE — ED Triage Notes (Signed)
Pt BIB ems for flank pain this morning. Pt informed EMS that urine is dark in color with smell. Pt took 2 Vicodin at home with no relief. Pt does hx of kidney stones.

## 2023-09-22 ENCOUNTER — Emergency Department (HOSPITAL_COMMUNITY): Payer: Medicare Other

## 2023-09-22 DIAGNOSIS — K859 Acute pancreatitis without necrosis or infection, unspecified: Secondary | ICD-10-CM | POA: Diagnosis not present

## 2023-09-22 MED ORDER — ONDANSETRON HCL 4 MG/2ML IJ SOLN
4.0000 mg | Freq: Once | INTRAMUSCULAR | Status: AC
  Administered 2023-09-22: 4 mg via INTRAVENOUS
  Filled 2023-09-22: qty 2

## 2023-09-22 MED ORDER — OXYCODONE-ACETAMINOPHEN 5-325 MG PO TABS: 1.0000 | ORAL_TABLET | Freq: Four times a day (QID) | ORAL | 0 refills | Status: DC | PRN

## 2023-09-22 MED ORDER — MORPHINE SULFATE (PF) 4 MG/ML IV SOLN
4.0000 mg | Freq: Once | INTRAVENOUS | Status: AC
  Administered 2023-09-22: 4 mg via INTRAVENOUS
  Filled 2023-09-22: qty 1

## 2023-09-22 MED ORDER — ONDANSETRON 4 MG PO TBDP: 4.0000 mg | ORAL_TABLET | Freq: Three times a day (TID) | ORAL | 0 refills | Status: DC | PRN

## 2023-09-22 NOTE — ED Notes (Signed)
Patient transported to CT 

## 2023-09-22 NOTE — ED Provider Notes (Signed)
EMERGENCY DEPARTMENT AT [facility] Provider Note   CSN: [id number] Arrival date & time: 09/21/23  1700     History  Chief Complaint  Patient presents with   Flank Pain    Brian Stark is a 77 y.o. male.  HPI     This is a 77 year old male who presents with abdominal and flank pain.  Patient reports pain mostly to the right flank and epigastrium.  Onset of pain yesterday.  Has a history of kidney stones and feels that this is the same.  Has had nausea without vomiting.  Denies any worsening of her food.  Took 2 Vicodin at home with no relief.  Has noted some discolored urine but no dysuria.  No fevers.  Home Medications Prior to Admission medications   Medication Sig Start Date End Date Taking? Authorizing Provider  ondansetron (ZOFRAN-ODT) 4 MG disintegrating tablet Take 1 tablet (4 mg total) by mouth every 8 (eight) hours as needed. 09/22/23  Yes Zelda Reames, Mayer Masker, MD  oxyCODONE-acetaminophen (PERCOCET/ROXICET) 5-325 MG tablet Take 1 tablet by mouth every 6 (six) hours as needed for severe pain (pain score 7-10). 09/22/23  Yes Icela Glymph, Mayer Masker, MD  apixaban (ELIQUIS) 5 MG TABS tablet Take 1 tablet (5 mg total) by mouth 2 (two) times daily. 08/05/23   Carnella Guadalajara, PA-C  Budeson-Glycopyrrol-Formoterol (BREZTRI AEROSPHERE) 160-9-4.8 MCG/ACT AERO Inhale into the lungs. prn    [provider]  cyclobenzaprine (FLEXERIL) 10 MG tablet Take 10 mg by mouth every 8 (eight) hours as needed. 08/18/19   [provider]  folic acid (FOLVITE) 400 MCG tablet Take 1 tablet (400 mcg total) by mouth daily. 11/07/21   Carnella Guadalajara, PA-C  HYDROcodone-acetaminophen (NORCO/VICODIN) 5-325 MG tablet Take one half to one tab po q 6 hrs prn pain 03/14/22   Triplett, Tammy, PA-C  ondansetron (ZOFRAN) 8 MG tablet Take 4 mg by mouth 2 (two) times daily.    [provider]  OVER THE COUNTER MEDICATION Nerve Tonic occasionally    [provider]  OVER THE COUNTER MEDICATION Hyland's Leg Cramps PM fast and Effective four times per week.    [provider]  vitamin B-12 (CYANOCOBALAMIN) 500 MCG tablet Take 1 tablet (500 mcg total) by mouth daily. Patient taking differently: Take 1,000 mcg by mouth daily. 11/07/21   Carnella Guadalajara, PA-C      Allergies    Floxacillin (flucloxacillin)    Review of Systems   Review of Systems  Constitutional:  Negative for fever.  Respiratory:  Negative for shortness of breath.   Cardiovascular:  Negative for chest pain.  Gastrointestinal:  Positive for abdominal pain and nausea. Negative for diarrhea and vomiting.  Genitourinary:  Positive for flank pain. Negative for dysuria.  All other systems reviewed and are negative.   Physical Exam Updated Vital Signs BP 135/81   Pulse 86   Temp 99.5 F (37.5 C) (Oral)   Resp 16   Ht 1.778 m (5\' 10" )   Wt 59.9 kg   SpO2 96%   BMI 18.94 kg/m  Physical Exam Vitals and nursing note reviewed.  Constitutional:      Appearance: He is well-developed. He is not ill-appearing.     Comments: Elderly, nontoxic-appearing  HENT:     Head: Normocephalic and atraumatic.  Eyes:     Pupils: Pupils are equal, round, and reactive to light.  Cardiovascular:     Rate and Rhythm: Normal rate and regular  rhythm.     Heart sounds: Normal heart sounds. No murmur heard. Pulmonary:     Effort: Pulmonary effort is normal. No respiratory distress.     Breath sounds: Normal breath sounds. No wheezing.  Abdominal:     General: Bowel sounds are normal.     Palpations: Abdomen is soft.     Tenderness: There is abdominal tenderness. There is no rebound.     Comments: Epigastric tenderness to palpation, right upper quadrant tenderness palpation, no rebound or guarding  Musculoskeletal:     Cervical back: Neck supple.  Lymphadenopathy:     Cervical: No cervical adenopathy.  Skin:    General: Skin is warm and dry.  Neurological:     Mental  Status: He is alert and oriented to person, place, and time.  Psychiatric:        Mood and Affect: Mood normal.     ED Results / Procedures / Treatments   Labs (all labs ordered are listed, but only abnormal results are displayed) Labs Reviewed  URINALYSIS, ROUTINE W REFLEX MICROSCOPIC - Abnormal; Notable for the following components:      Result Value   Color, Urine AMBER (*)    Bilirubin Urine SMALL (*)    All other components within normal limits  BASIC METABOLIC PANEL - Abnormal; Notable for the following components:   Potassium 3.4 (*)    Glucose, Bld 134 (*)    Creatinine, Ser 1.28 (*)    GFR, Estimated 58 (*)    All other components within normal limits  CBC - Abnormal; Notable for the following components:   RBC 3.70 (*)    MCV 106.8 (*)    MCH 35.9 (*)    Platelets 60 (*)    All other components within normal limits  LIPASE, BLOOD - Abnormal; Notable for the following components:   Lipase 65 (*)    All other components within normal limits    EKG None  Radiology CT Renal Stone Study  Result Date: 09/22/2023 CLINICAL DATA:  Flank pain EXAM: CT ABDOMEN AND PELVIS WITHOUT CONTRAST TECHNIQUE: Multidetector CT imaging of the abdomen and pelvis was performed following the standard protocol without IV contrast. RADIATION DOSE REDUCTION: This exam was performed according to the departmental dose-optimization program which includes automated exposure control, adjustment of the mA and/or kV according to patient size and/or use of iterative reconstruction technique. COMPARISON:  None Available. FINDINGS: Lower chest: Mild atelectatic changes in the bases bilaterally Hepatobiliary: Gallbladder has been surgically removed. Central biliary ductal dilatation is seen. This extends to the level of the common bile duct. These changes are consistent with the post cholecystectomy state. Pancreas: Some inflammatory changes are noted about the head of the pancreas and second portion of the  duodenum. Spleen: Normal in size without focal abnormality. Adrenals/Urinary Tract: Adrenal glands are within normal limits. Right kidney shows no renal calculi or obstructive changes. Prominent extrarenal pelvis is noted on the left. Cystic changes are seen as well. Nonobstructing left renal stone is again seen in the lower pole measuring 7 mm. Bladder is well distended. Stomach/Bowel: No obstructive or inflammatory changes of the colon are seen. The appendix is within normal limits. Small bowel is unremarkable with the exception of the thickening of the second portion of the duodenum likely representing duodenitis. Stomach is unremarkable. Vascular/Lymphatic: Aortic atherosclerosis. No enlarged abdominal or pelvic lymph nodes. Reproductive: Prostate is unremarkable. Other: No abdominal wall hernia or abnormality. No abdominopelvic ascites. Musculoskeletal: No acute or significant osseous findings.  IMPRESSION: Status post cholecystectomy. Inflammatory changes are noted surrounding the head of the pancreas and second portion of the duodenum. Given the elevated lipase these changes likely represent some mild changes of pancreatitis with secondary reactive changes surrounding the duodenum. Nonobstructing left renal stone Electronically Signed   By: Alcide Clever M.D.   On: 09/22/2023 02:27    Procedures Procedures    Medications Ordered in ED Medications  ondansetron (ZOFRAN-ODT) disintegrating tablet 4 mg (4 mg Oral Given 09/21/23 1720)  morphine (PF) 4 MG/ML injection 4 mg (4 mg Intravenous Given 09/22/23 0111)  ondansetron (ZOFRAN) injection 4 mg (4 mg Intravenous Given 09/22/23 0111)    ED Course/ Medical Decision Making/ A&P Clinical Course as of 09/22/23 0502  Tue Sep 22, 2023  0405 On recheck, patient states he feels somewhat better.  Pain has improved with pain medication.  He is able to tolerate fluids.  He would like to go home if possible.  Given that he is tolerating fluids, feel that this  is reasonable. [CH]    Clinical Course User Index [CH] Nuriyah Hanline, Mayer Masker, MD                                 Medical Decision Making Amount and/or Complexity of Data Reviewed Labs: ordered. Radiology: ordered.  Risk Prescription drug management.   This patient presents to the ED for concern of abdominal pain, this involves an extensive number of treatment options, and is a complaint that carries with it a high risk of complications and morbidity.  I considered the following differential and admission for this acute, potentially life threatening condition.  The differential diagnosis includes kidney stone, UTI, gastritis, gastroenteritis, pancreatitis, cholecystitis  MDM:    This is a 76 year old male who presents with abdominal pain.  He is overall nontoxic and vital signs are reassuring.  Mostly complaining of right flank pain but on exam is most tender over the epigastrium.  Patient was given pain and nausea medication.  Labs obtained and reviewed.  Metabolic panel at baseline. No leukocytosis.  Lipase slightly elevated at 65.  CT scan obtained.  CT shows no evidence of kidney stone but does show some inflammation of the pancreas.  On recheck, patient states he feels much better.  He would like to try to go home.  He is able to orally hydrate on his own.  We discussed clear liquid diet and pain control at home.  Patient wishes to be discharged which I feel is reasonable.  (Labs, imaging, consults)  Labs: I Ordered, and personally interpreted labs.  The pertinent results include: CBC, CMP, lipase, urinalysis  Imaging Studies ordered: I ordered imaging studies including CT I independently visualized and interpreted imaging. I agree with the radiologist interpretation  Additional history obtained from chart review.  External records from outside source obtained and reviewed including prior evaluations  Cardiac Monitoring: The patient was maintained on a cardiac monitor.  If on the  cardiac monitor, I personally viewed and interpreted the cardiac monitored which showed an underlying rhythm of: Sinus  Reevaluation: After the interventions noted above, I reevaluated the patient and found that they have :improved  Social Determinants of Health:  lives independently  Disposition: Discharge  Co morbidities that complicate the patient evaluation  Past Medical History:  Diagnosis Date   Bursitis    l shoulder   Cataract    Elevated bilirubin    H/O: rheumatic fever  Kidney stones    Loss of teeth due to extraction    Migraines    Pulmonary embolism (HCC)    Rheumatic fever    Tendonitis    L shoulder     Medicines Meds ordered this encounter  Medications   ondansetron (ZOFRAN-ODT) disintegrating tablet 4 mg   morphine (PF) 4 MG/ML injection 4 mg   ondansetron (ZOFRAN) injection 4 mg   oxyCODONE-acetaminophen (PERCOCET/ROXICET) 5-325 MG tablet    Sig: Take 1 tablet by mouth every 6 (six) hours as needed for severe pain (pain score 7-10).    Dispense:  10 tablet    Refill:  0   ondansetron (ZOFRAN-ODT) 4 MG disintegrating tablet    Sig: Take 1 tablet (4 mg total) by mouth every 8 (eight) hours as needed.    Dispense:  20 tablet    Refill:  0    I have reviewed the patients home medicines and have made adjustments as needed  Problem List / ED Course: Problem List Items Addressed This Visit   None Visit Diagnoses     Acute pancreatitis, unspecified complication status, unspecified pancreatitis type    -  Primary   Relevant Medications   morphine (PF) 4 MG/ML injection 4 mg (Completed)   oxyCODONE-acetaminophen (PERCOCET/ROXICET) 5-325 MG tablet                   Final Clinical Impression(s) / ED Diagnoses Final diagnoses:  Acute pancreatitis, unspecified complication status, unspecified pancreatitis type    Rx / DC Orders ED Discharge Orders          Ordered    oxyCODONE-acetaminophen (PERCOCET/ROXICET) 5-325 MG tablet  Every  6 hours PRN        09/22/23 0458    ondansetron (ZOFRAN-ODT) 4 MG disintegrating tablet  Every 8 hours PRN        09/22/23 0458              Shon Baton, MD 09/22/23 [phone]

## 2023-09-22 NOTE — Discharge Instructions (Signed)
You were seen today for abdominal pain.  Your CT scan indicates some inflammation around your pancreas.  You may have some mild pancreatitis.  Follow a clear liquid diet for the next 1 to 2 days.  Make sure that you are staying hydrated.  As pain improves, you can advance your diet.  Take medications as needed.  If you have new or worsening symptoms, you should be reevaluated.

## 2023-12-15 ENCOUNTER — Inpatient Hospital Stay: Payer: Medicare Other

## 2023-12-17 ENCOUNTER — Inpatient Hospital Stay: Payer: Medicare Other | Attending: Hematology

## 2023-12-17 DIAGNOSIS — E538 Deficiency of other specified B group vitamins: Secondary | ICD-10-CM

## 2023-12-17 DIAGNOSIS — Z86718 Personal history of other venous thrombosis and embolism: Secondary | ICD-10-CM

## 2023-12-17 DIAGNOSIS — D539 Nutritional anemia, unspecified: Secondary | ICD-10-CM | POA: Insufficient documentation

## 2023-12-17 DIAGNOSIS — Z86711 Personal history of pulmonary embolism: Secondary | ICD-10-CM | POA: Diagnosis not present

## 2023-12-17 DIAGNOSIS — D696 Thrombocytopenia, unspecified: Secondary | ICD-10-CM | POA: Insufficient documentation

## 2023-12-17 DIAGNOSIS — R634 Abnormal weight loss: Secondary | ICD-10-CM | POA: Diagnosis not present

## 2023-12-17 DIAGNOSIS — D61818 Other pancytopenia: Secondary | ICD-10-CM | POA: Insufficient documentation

## 2023-12-17 DIAGNOSIS — Z87891 Personal history of nicotine dependence: Secondary | ICD-10-CM

## 2023-12-17 DIAGNOSIS — I2694 Multiple subsegmental pulmonary emboli without acute cor pulmonale: Secondary | ICD-10-CM

## 2023-12-17 DIAGNOSIS — F17210 Nicotine dependence, cigarettes, uncomplicated: Secondary | ICD-10-CM | POA: Diagnosis not present

## 2023-12-17 DIAGNOSIS — Z7901 Long term (current) use of anticoagulants: Secondary | ICD-10-CM | POA: Insufficient documentation

## 2023-12-17 DIAGNOSIS — Z122 Encounter for screening for malignant neoplasm of respiratory organs: Secondary | ICD-10-CM

## 2023-12-17 LAB — CBC WITH DIFFERENTIAL/PLATELET
Abs Immature Granulocytes: 0.01 10*3/uL (ref 0.00–0.07)
Basophils Absolute: 0 10*3/uL (ref 0.0–0.1)
Basophils Relative: 1 %
Eosinophils Absolute: 0.1 10*3/uL (ref 0.0–0.5)
Eosinophils Relative: 2 %
HCT: 39.5 % (ref 39.0–52.0)
Hemoglobin: 13.5 g/dL (ref 13.0–17.0)
Immature Granulocytes: 0 %
Lymphocytes Relative: 40 %
Lymphs Abs: 1.1 10*3/uL (ref 0.7–4.0)
MCH: 37.2 pg — ABNORMAL HIGH (ref 26.0–34.0)
MCHC: 34.2 g/dL (ref 30.0–36.0)
MCV: 108.8 fL — ABNORMAL HIGH (ref 80.0–100.0)
Monocytes Absolute: 0.4 10*3/uL (ref 0.1–1.0)
Monocytes Relative: 13 %
Neutro Abs: 1.3 10*3/uL — ABNORMAL LOW (ref 1.7–7.7)
Neutrophils Relative %: 44 %
Platelets: 60 10*3/uL — ABNORMAL LOW (ref 150–400)
RBC: 3.63 MIL/uL — ABNORMAL LOW (ref 4.22–5.81)
RDW: 14.8 % (ref 11.5–15.5)
Smear Review: DECREASED
WBC: 2.9 10*3/uL — ABNORMAL LOW (ref 4.0–10.5)
nRBC: 0 % (ref 0.0–0.2)

## 2023-12-17 LAB — COMPREHENSIVE METABOLIC PANEL
ALT: 25 U/L (ref 0–44)
AST: 37 U/L (ref 15–41)
Albumin: 3.7 g/dL (ref 3.5–5.0)
Alkaline Phosphatase: 97 U/L (ref 38–126)
Anion gap: 6 (ref 5–15)
BUN: 10 mg/dL (ref 8–23)
CO2: 28 mmol/L (ref 22–32)
Calcium: 9 mg/dL (ref 8.9–10.3)
Chloride: 103 mmol/L (ref 98–111)
Creatinine, Ser: 1.41 mg/dL — ABNORMAL HIGH (ref 0.61–1.24)
GFR, Estimated: 51 mL/min — ABNORMAL LOW (ref >60)
Glucose, Bld: 95 mg/dL (ref 70–99)
Potassium: 3.6 mmol/L (ref 3.5–5.1)
Sodium: 137 mmol/L (ref 135–145)
Total Bilirubin: 1.4 mg/dL — ABNORMAL HIGH (ref 0.0–1.2)
Total Protein: 6.4 g/dL — ABNORMAL LOW (ref 6.5–8.1)

## 2023-12-17 LAB — IRON AND TIBC
Iron: 154 ug/dL (ref 45–182)
Saturation Ratios: 48 % — ABNORMAL HIGH (ref 17.9–39.5)
TIBC: 324 ug/dL (ref 250–450)
UIBC: 170 ug/dL

## 2023-12-17 LAB — FOLATE: Folate: 24.1 ng/mL (ref >5.9)

## 2023-12-17 LAB — FERRITIN: Ferritin: 75 ng/mL (ref 24–336)

## 2023-12-17 LAB — LACTATE DEHYDROGENASE: LDH: 127 U/L (ref 98–192)

## 2023-12-17 LAB — VITAMIN B12: Vitamin B-12: 300 pg/mL (ref 180–914)

## 2023-12-18 LAB — HOMOCYSTEINE: Homocysteine: 35.6 umol/L — ABNORMAL HIGH (ref 0.0–19.2)

## 2023-12-19 LAB — METHYLMALONIC ACID, SERUM: Methylmalonic Acid, Quantitative: 669 nmol/L — ABNORMAL HIGH (ref 0–378)

## 2023-12-22 ENCOUNTER — Inpatient Hospital Stay: Payer: Medicare Other | Admitting: Physician Assistant

## 2023-12-22 NOTE — Progress Notes (Unsigned)
 VIRTUAL VISIT via TELEPHONE NOTE [facility]   I connected with Brian Stark  on 12/23/23 at  10:09 AM by telephone and verified that I am speaking with the correct person using two identifiers.  Location: Patient: Home Provider: [facility]   I discussed the limitations, risks, security and privacy concerns of performing an evaluation and management service by telephone and the availability of in person appointments. I also discussed with the patient that there may be a patient responsible charge related to this service. The patient expressed understanding and agreed to proceed.  REASON FOR VISIT: Follow-up for pancytopenia + history of bilateral pulmonary embolism   CURRENT THERAPY: Eliquis 5 mg twice daily  INTERVAL HISTORY:  Brian Stark is contacted today for follow-up of thrombocytopenia and his history of pulmonary embolism.  He was last seen by Rojelio Brenner PA-C on 08/05/2023.   At today's visit, he reports feeling fatigued and overwhelmed in light of his recent moved to a new home.  No recent hospitalizations, surgeries, or changes in baseline health status.  He has 40% energy and 40% appetite.  He is taking vitamin B12 every other day and daily folic acid, although he reports he may have missed a few doses during his recent move.   THROMBOCYTOPENIA / PANCYTOPENIA:  He has easy bruising but no petechial rash.   He denies any hemoptysis, epistaxis, hematochezia, melena, or hematemesis. He has mild night sweats and chills a few nights each week, but no fever.  He has not had any recent infections.     WEIGHT LOSS: During the stress of his recent move, he stopped drinking Ensure.  Reports that he just "got sick of them," and they stopped having any appeal.  He has a hard time forcing himself to eat, and reports very poor appetite.  His weight today is 126 pounds (patient reported), which is down from 132 pounds in November 2024.  HISTORY OF  DVT/PE:  He is compliant with Eliquis, and has not had any major bleeding events.  He has not had any falls since he fell in May 2023.  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 He has chronic dyspnea on exertion.  No pleuritic chest pain or palpitations.  ASSESSMENT & PLAN:  1.  Pancytopenia with macrocytosis - Moderate thrombocytopenia and macrocytic anemia since November 2019.  Development of mild leukopenia since January 2023. - No known history of liver disease - CT abdomen/pelvis (12/10/2022) showed normal-sized spleen and unchanged calcification near the hepatic dome - Hematology workup revealed the following normal/negative results: TSH normal.  SPEP and hepatitis panel negative.  Reticulocytes 1.0 (hypoproliferative for degree of anemia).  LDH normal. - Initial workup showed B12 and folic acid deficiency, but patient has had persistent pancytopenia despite nutritional correction.  (Taking B12 and folic acid supplement since June 2023). - Admits to easy bruising, but denies any major bleeding episodes or petechial rash.  No bright red blood per rectum or melena.  No B symptoms. - Most recent labs (12/17/2023):  WBC 2.9/ANC 1.3, platelets 60.  Hgb normal at 13.5, but with low RBC 3.63 and high MCV 108.8. Ferritin 75, iron saturation 48% Vitamin B12 300, elevated MMA 669.  Normal folate, but elevated homocystine 35.6. Creatinine 1.41/GFR 51. Chronic elevation in bilirubin at 1.4, but LDH normal. - Differential diagnosis favors early myelodysplasia.  May have some  effects of nutritional deficiency and anemia of CKD.  Less likely to be immune mediated thrombocytopenia or neutropenia.    - PLAN: Slight worsening of WBC and platelets, may be due to recurrent B12/folate deficiency (elevated MMA and homocysteine), but my suspicion remains high that patient has underlying MDS or other bone marrow  infiltrative disorder. - Strongly recommended bone marrow biopsy, but patient continues to refuse.  We discussed that he may have MDS, and if so he has potential for leukemic transformation.  He continues to refuse bone marrow biopsy at this time. - Recommend that he increase vitamin B12 supplement to daily, and continue folic acid supplement daily. - Repeat labs (CBC/D, CMP, LDH, B12, folate, homocystine, MMA, ferritin, iron/TIBC) and RTC in 3 months.    2.  Bilateral pulmonary embolisms/right leg DVT, recurrent, unprovoked - Hospital admission from 10/15/2019 through 10/17/2019 with hemoptysis. - CT chest on 10/16/2019 showed acute bilateral pulmonary emboli greater within the left lower lobe with no evidence of right heart strain.  Dopplers showed no evidence of DVT. - Eliquis was apparently discontinued in October 2021. - CT angio on 09/30/2020 shows acute PE in segmental and subsegmental branches supplying the right lower, right middle and left lower and left upper lobes. - He was found to have right leg DVT in November 2021 - " extensive right lower extremity DVT extending throughout femoral vein and deep femoral vein, through popliteal vein and into the calf" - Patient was started back on Eliquis in November 2021 after diagnosis of recurrent PE/DVT - He remains on Eliquis 5 mg twice daily, which he is tolerating well - No major bleeding episodes such as bright blood per rectum or melena. - ED visit for fall on 03/14/2022.  He denies any falls since that time. - Patient does have some post thrombotic changes of right lower extremity (intermittent right lower extremity edema and right ankle varicose veins), but does not have any acute signs or symptoms concerning for recurrent DVT or PE at this time - PLAN: Continue indefinite Eliquis.  No bleeding issues reported. - We discussed increased risk of  fall-associated bleeding including hemorrhagic CVA.  Discussed fall safety and prevention at home.   Patient wishes to continue Eliquis at this time, which is reasonable considering his high risk of recurrent VTE if he were to stop anticoagulation.   3.  Weight loss - Patient had previously lost 15-20 pounds since December 2021, secondary to poor appetite - Appetite improved, weight is stable/improved - LDCT chest (October 2023) did not show any signs of active malignancy - CT scans of head, chest, and pelvis from ED visit on 03/14/2022 negative for any signs of malignancy. - At today's visit (12/23/2023), patient reports 6 pound weight loss since November 2024, which he attributes to decreased appetite and stopping Ensure. - PLAN: Strongly encouraged patient to restart Ensure and to try to increase caloric intake.  Recommended nutritional referral, but he declines dietitian evaluation at this time.    4.  Tobacco abuse - He has been smoking 1 PPD x50 years, continues to smoke 1 pack/day cigarettes - LDCT scan of chest (08/14/2022): Lung RADS 2S, benign appearance or behavior; recommended repeat LDCT chest in 12 months.  INCIDENTAL FINDINGS discussed with patient as below. Aortic atherosclerosis and LAD coronary artery disease. Findings consistent with COPD Probable left hydronephrosis, incompletely imaged.  (Note that mild left hydronephrosis was also seen on abdominal US 11/22/2021) - patient denies any urinary obstructive symptoms  - Most recent LDCT  chest (09/11/2023): Lung RADS 2S, benign. - PLAN: Next LDCT chest due November 2025. - Recommend PCP follow-up for the above incidental findings    5.  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 - CT abdomen/pelvis (12/10/2022) showed normal-sized spleen and unchanged calcification near the hepatic dome - Patient reports history of "liver stones" in the 1980s that  required hospitalization and surgical removal - He reports that he has had "problems with his liver numbers" for the past 40+ years - Patient has not seen gastroenterology for "many years."  He was referred to gastroenterology following hospital discharge in January 2023. - Seen by gastroenterology for elevated bilirubin which was felt to be "most consistent with Gilbert's syndrome per note by NP Lewie Loron on 01/07/2023   PLAN SUMMARY: >> Labs in 3 months = CBC/D, CMP, LDH, B12, folate, homocystine, MMA, ferritin, iron/TIBC >> OFFICE visit in 3 months, 1 week after labs  ** Patient requests afternoon appointments. ** Patient request printed copy of AVS, schedule, and instructions to be mailed to him.  Please CALL PATIENT first to confirm home address, as he has recently moved.     REVIEW OF SYSTEMS:   Review of Systems  Constitutional:  Positive for chills, diaphoresis, malaise/fatigue and weight loss. Negative for fever.  Respiratory:  Positive for shortness of breath. Negative for cough.   Cardiovascular:  Negative for chest pain and palpitations.  Gastrointestinal:  Positive for abdominal pain, constipation and nausea. Negative for blood in stool, melena and vomiting.  Neurological:  Positive for headaches. Negative for dizziness.  Psychiatric/Behavioral:  The patient has insomnia.     PHYSICAL EXAM: (per limitations of virtual telephone visit)  The patient is alert and oriented x 3, exhibiting adequate mentation, good mood, and ability to speak in full sentences and execute sound judgement.  WRAP UP:   I discussed the assessment and treatment plan with the patient. The patient was provided an opportunity to ask questions and all were answered. The patient agreed with the plan and demonstrated an understanding of the instructions.   The patient was advised to call back or seek an in-person evaluation if the symptoms worsen or if the condition fails to improve as anticipated.  I  provided 25 minutes of non-face-to-face time during this encounter, including > 10 minutes of medical discussion.  Carnella Guadalajara, PA-C 12/23/23 3:59 PM

## 2023-12-23 ENCOUNTER — Inpatient Hospital Stay: Payer: Medicare Other | Admitting: Physician Assistant

## 2023-12-23 DIAGNOSIS — D539 Nutritional anemia, unspecified: Secondary | ICD-10-CM

## 2023-12-23 DIAGNOSIS — Z7901 Long term (current) use of anticoagulants: Secondary | ICD-10-CM

## 2023-12-23 DIAGNOSIS — Z87891 Personal history of nicotine dependence: Secondary | ICD-10-CM | POA: Diagnosis not present

## 2023-12-23 DIAGNOSIS — D61818 Other pancytopenia: Secondary | ICD-10-CM

## 2023-12-23 DIAGNOSIS — Z122 Encounter for screening for malignant neoplasm of respiratory organs: Secondary | ICD-10-CM | POA: Diagnosis not present

## 2023-12-23 DIAGNOSIS — Z86718 Personal history of other venous thrombosis and embolism: Secondary | ICD-10-CM

## 2023-12-23 DIAGNOSIS — D696 Thrombocytopenia, unspecified: Secondary | ICD-10-CM

## 2023-12-23 DIAGNOSIS — E538 Deficiency of other specified B group vitamins: Secondary | ICD-10-CM

## 2023-12-23 DIAGNOSIS — I2694 Multiple subsegmental pulmonary emboli without acute cor pulmonale: Secondary | ICD-10-CM

## 2023-12-23 NOTE — Patient Instructions (Signed)
 [facility] at [facility] Discharge Instructions  You were seen today by Rojelio Brenner PA-C for your low blood cells and your history of blood clots in your legs/lungs.  LOW BLOOD COUNTS: You have low levels of all three main types of blood cells in your body (red blood cells, platelets, white blood cells).  I suspect that this may be caused by a type of bone marrow cancer called "MDS" or myelodysplastic syndrome.   I recommend BONE MARROW BIOPSY for further evaluation, but respect your choice to not have that test at this time. We will continue to monitor your blood counts closely, as MDS can sometimes turn into a type of blood cancer called leukemia.  VITAMIN DEFICIENCIES: Your labs showed improved vitamin B12 and folate.  You should continue your B12 and folic acid supplement.  Please INCREASE your vitamin B12 supplement to take this daily (instead of every other day).  HISTORY OF BLOOD CLOTS: Continue Eliquis indefinitely for prevention of recurrent blood clots.  Seek immediate medical attention if you experience any signs of major bleeding events.  Be careful not to fall at home!  WEIGHT LOSS: Try to increase your caloric intake and drink 2-3 Ensures beverage per day.  If you change your mind and would like to be evaluated by our nutritionist/dietitian, please let us know.  FOLLOW-UP APPOINTMENT: Office visit in 3 months, with labs 1 week before  ** Thank you for trusting me with your healthcare!  I strive to provide all of my patients with quality care at each visit.  If you receive a survey for this visit, I would be so grateful to you for taking the time to provide feedback.  Thank you in advance!  ~ Legend Tumminello                   Dr. Doreatha Massed   &   Rojelio Brenner, PA-C   - - - - - - - - - - - - - - - - - -     Thank you for choosing [facility] at [facility] to provide your oncology and hematology care.  To afford each  patient quality time with our provider, please arrive at least 15 minutes before your scheduled appointment time.   If you have a lab appointment with the Cancer Center please come in thru the Main Entrance and check in at the main information desk.  You need to re-schedule your appointment should you arrive 10 or more minutes late.  We strive to give you quality time with our providers, and arriving late affects you and other patients whose appointments are after yours.  Also, if you no show three or more times for appointments you may be dismissed from the clinic at the providers discretion.     Again, thank you for choosing [facility].  Our hope is that these requests will decrease the amount of time that you wait before being seen by our physicians.       _____________________________________________________________  Should you have questions after your visit to [facility], please contact our office at [phone] and follow the prompts.  Our office hours are 8:00 a.m. and 4:30 p.m. Monday - Friday.  Please note that voicemails left after 4:00 p.m. may not be returned until the following business day.  We are closed weekends and major holidays.  You do have access to a nurse 24-7, just call the  main number to the clinic [phone] and do not press any options, hold on the line and a nurse will answer the phone.    For prescription refill requests, have your pharmacy contact our office and allow 72 hours.    Due to Covid, you will need to wear a mask upon entering the hospital. If you do not have a mask, a mask will be given to you at the Main Entrance upon arrival. For doctor visits, patients may have 1 support person age 25 or older with them. For treatment visits, patients can not have anyone with them due to social distancing guidelines and our immunocompromised population.

## 2023-12-25 IMAGING — CT CT PELVIS W/O CM
2 of 7 series · 9 of 32 positions shown, 13 images · non-contrast
Comparison: CT 09/19/2019

CLINICAL DATA: Hip trauma, fracture suspected, no prior imaging
fall x 2 days ago

EXAM:
CT PELVIS WITHOUT CONTRAST
TECHNIQUE: Multidetector CT imaging of the pelvis was performed following the
standard protocol without intravenous contrast.
RADIATION DOSE REDUCTION: This exam was performed according to the
departmental dose-optimization program which includes automated
exposure control, adjustment of the mA and/or kV according to
patient size and/or use of iterative reconstruction technique.

[Series 3: thin bone · axial · 0.89mm/px · z∈[+718,+915]mm · 6 of 485 slices shown]
[im 31/485  bone]
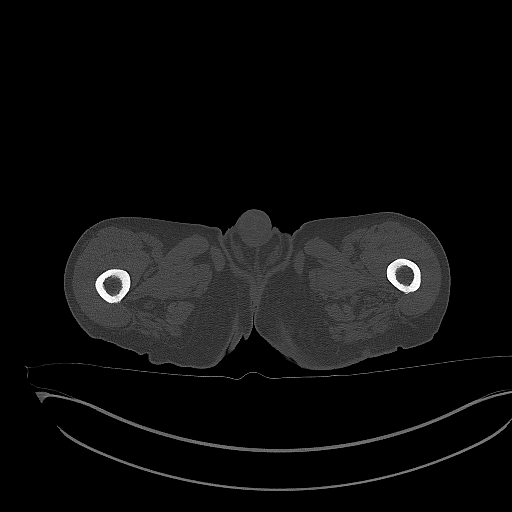
[im 91/485  bone]
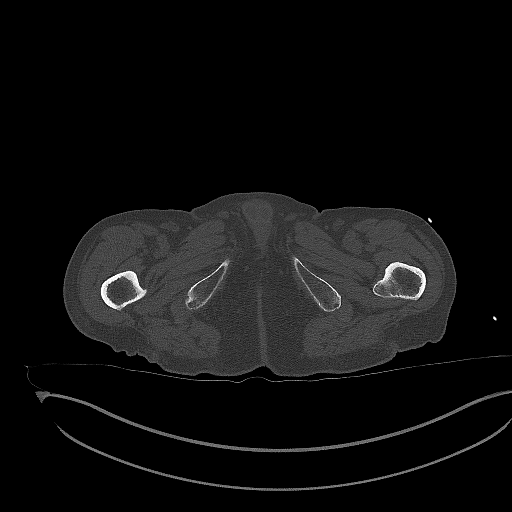
[im 152/485  bone]
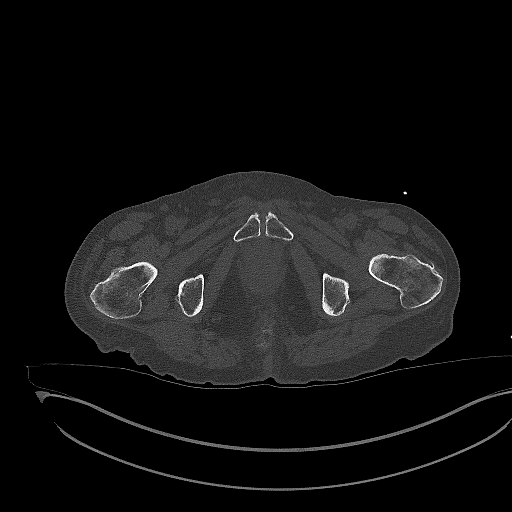
[im 212/485  bone]
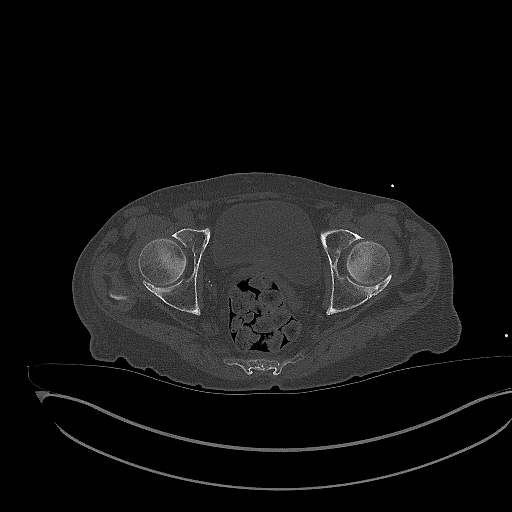
[im 273/485  bone]
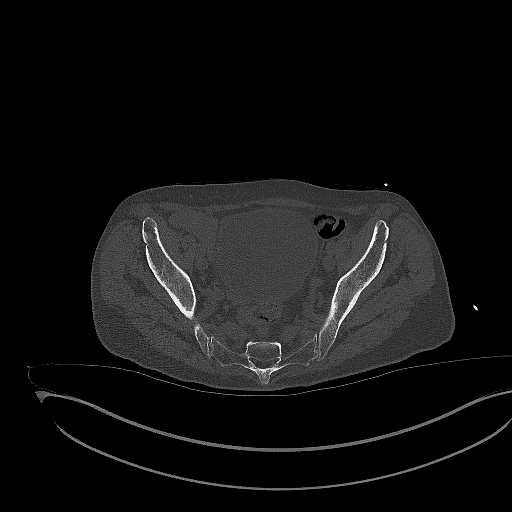
[im 333/485  bone]
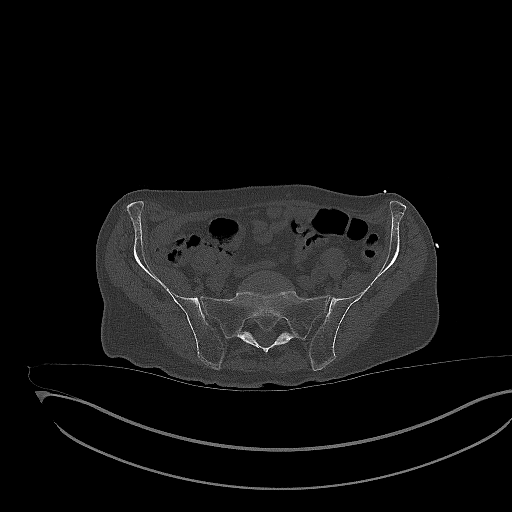

[Series 14: axial st · axial · 0.39mm/px · z∈[+787,+947]mm · 3 of 161 slices shown, 7 images]
[im 41/161  soft-tissue]
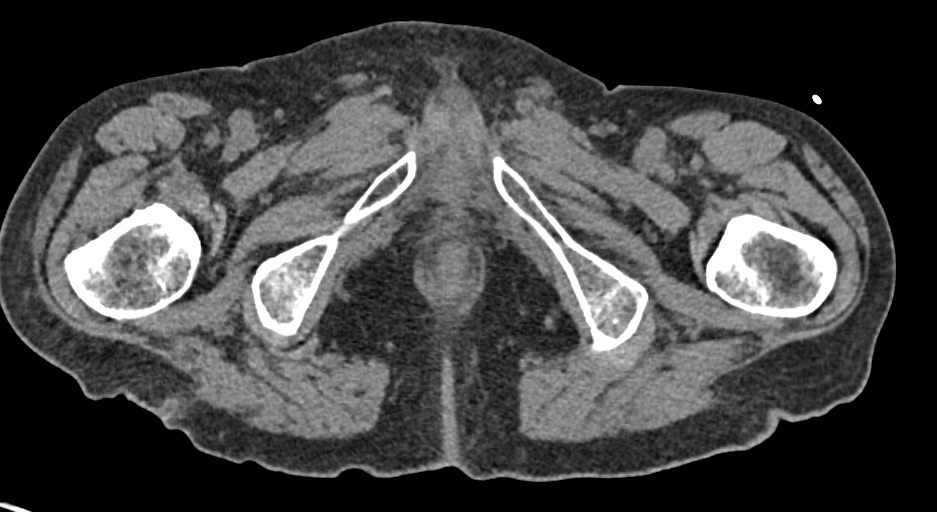
[im 41/161  lung]
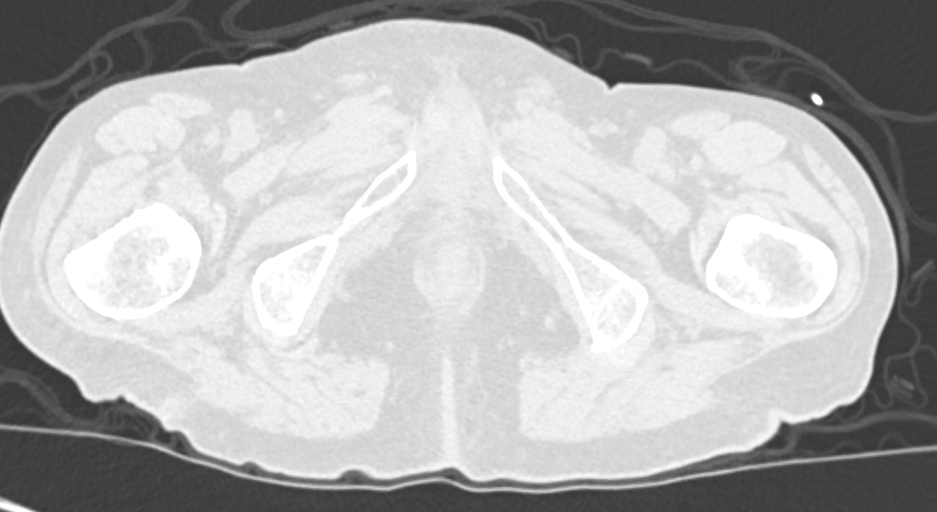
[im 41/161  bone]
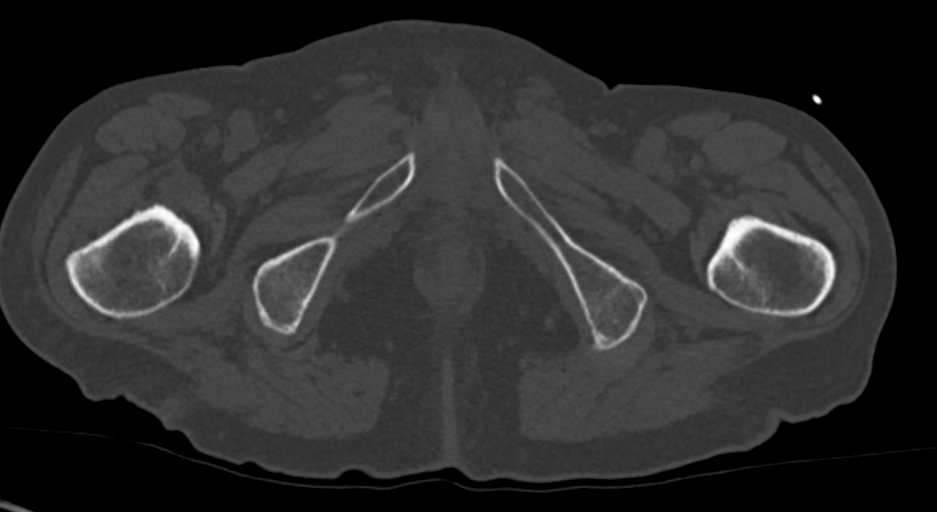
[im 81/161  soft-tissue]
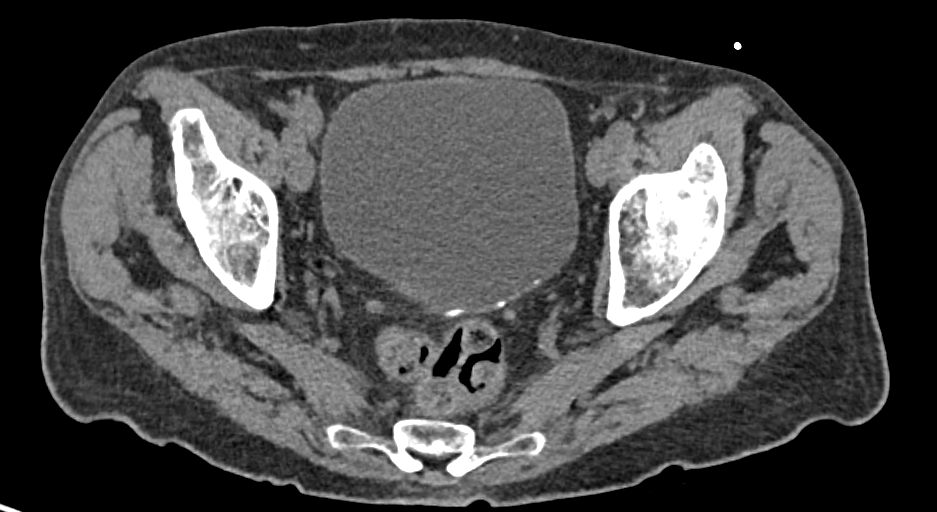
[im 81/161  lung]
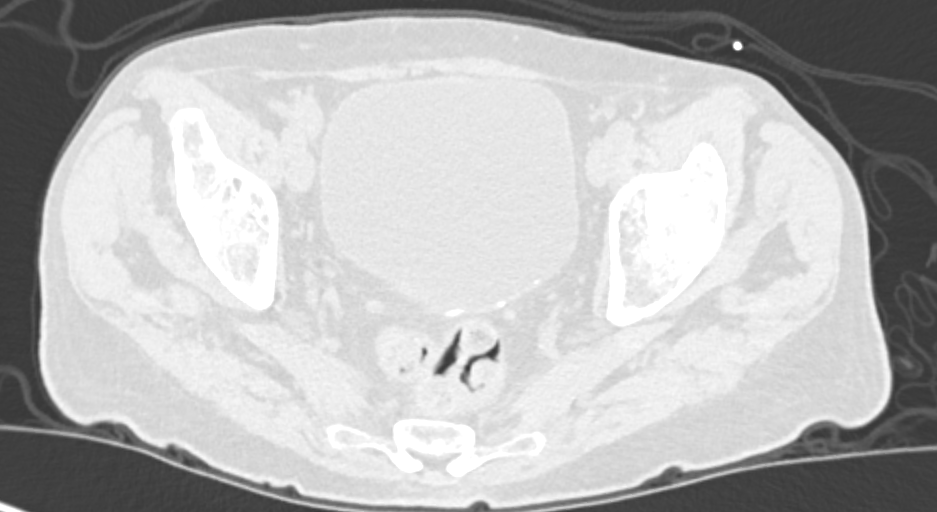
[im 121/161  soft-tissue]
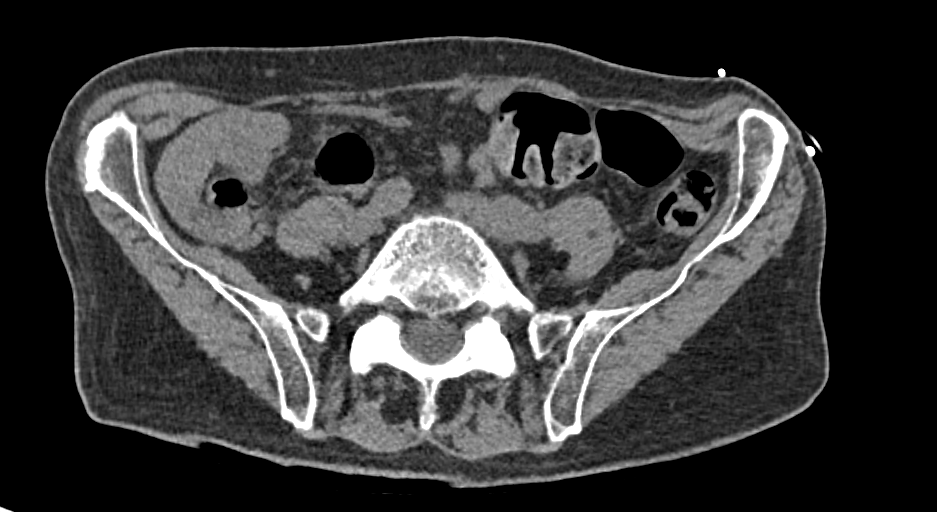
[im 121/161  lung]
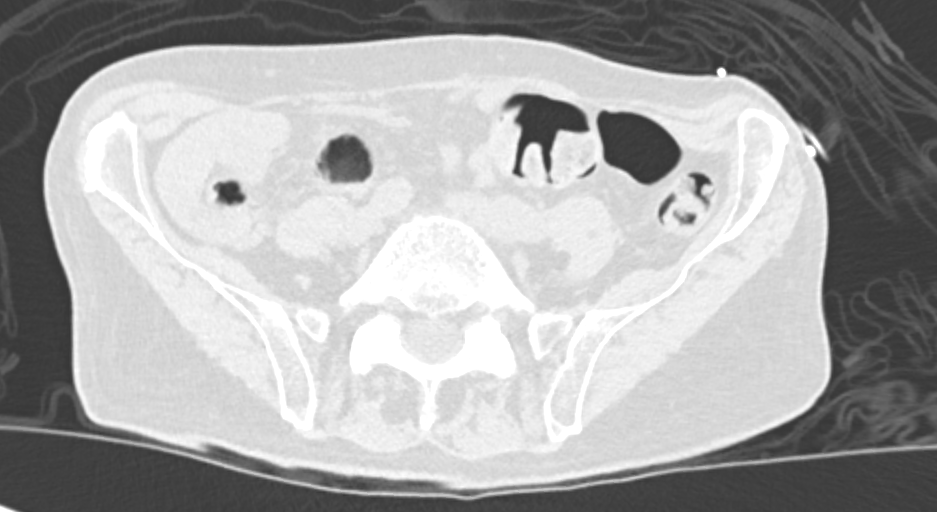

[9 of 32 positions shown; findings below may reference images not displayed]

FINDINGS: Urinary Tract: Moderate distention of the urinary bladder with
layering calcifications posteriorly.

Bowel:  Moderate colonic stool burden including in the rectum.

Vascular/Lymphatic: No lymphadenopathy.

Reproductive:  Mildly enlarged prostate gland

Other:  None

Musculoskeletal:

There is no evidence of acute fracture. Alignment is normal. There
is mild to moderate bilateral hip osteoarthritis. Small left hip
joint effusion.

Lower lumbar spine degenerative disc disease and facet arthropathy,
with degenerative trace retrolisthesis at L3-L4 and degenerative
trace anterolisthesis at L4-L5. There is disc bulging at L3-L4,
L4-L5, and L5-S1. There is mild neural foraminal narrowing and
mild-to-moderate spinal canal stenosis. Mild degenerative changes of
the SI joints and symphysis pubis. No acute myotendinous abnormality
by CT.
IMPRESSION: No evidence of acute fracture. Mild to moderate bilateral hip
osteoarthritis. Small left hip joint effusion.

Degenerative changes of the lumbar spine.

## 2024-03-24 ENCOUNTER — Inpatient Hospital Stay: Payer: Medicare Other | Attending: Hematology

## 2024-03-24 DIAGNOSIS — Z122 Encounter for screening for malignant neoplasm of respiratory organs: Secondary | ICD-10-CM

## 2024-03-24 DIAGNOSIS — Z7901 Long term (current) use of anticoagulants: Secondary | ICD-10-CM

## 2024-03-24 DIAGNOSIS — E538 Deficiency of other specified B group vitamins: Secondary | ICD-10-CM | POA: Insufficient documentation

## 2024-03-24 DIAGNOSIS — D539 Nutritional anemia, unspecified: Secondary | ICD-10-CM | POA: Diagnosis not present

## 2024-03-24 DIAGNOSIS — Z87891 Personal history of nicotine dependence: Secondary | ICD-10-CM

## 2024-03-24 DIAGNOSIS — D61818 Other pancytopenia: Secondary | ICD-10-CM | POA: Insufficient documentation

## 2024-03-24 DIAGNOSIS — Z86718 Personal history of other venous thrombosis and embolism: Secondary | ICD-10-CM

## 2024-03-24 DIAGNOSIS — D696 Thrombocytopenia, unspecified: Secondary | ICD-10-CM | POA: Insufficient documentation

## 2024-03-24 DIAGNOSIS — I2694 Multiple subsegmental pulmonary emboli without acute cor pulmonale: Secondary | ICD-10-CM

## 2024-03-24 LAB — FERRITIN: Ferritin: 82 ng/mL (ref 24–336)

## 2024-03-24 LAB — CBC WITH DIFFERENTIAL/PLATELET
Abs Immature Granulocytes: 0.01 10*3/uL (ref 0.00–0.07)
Basophils Absolute: 0 10*3/uL (ref 0.0–0.1)
Basophils Relative: 1 %
Eosinophils Absolute: 0.1 10*3/uL (ref 0.0–0.5)
Eosinophils Relative: 3 %
HCT: 36.3 % — ABNORMAL LOW (ref 39.0–52.0)
Hemoglobin: 12.2 g/dL — ABNORMAL LOW (ref 13.0–17.0)
Immature Granulocytes: 0 %
Lymphocytes Relative: 51 %
Lymphs Abs: 1.8 10*3/uL (ref 0.7–4.0)
MCH: 36.3 pg — ABNORMAL HIGH (ref 26.0–34.0)
MCHC: 33.6 g/dL (ref 30.0–36.0)
MCV: 108 fL — ABNORMAL HIGH (ref 80.0–100.0)
Monocytes Absolute: 0.4 10*3/uL (ref 0.1–1.0)
Monocytes Relative: 12 %
Neutro Abs: 1.1 10*3/uL — ABNORMAL LOW (ref 1.7–7.7)
Neutrophils Relative %: 33 %
Platelets: 78 10*3/uL — ABNORMAL LOW (ref 150–400)
RBC: 3.36 MIL/uL — ABNORMAL LOW (ref 4.22–5.81)
RDW: 14.6 % (ref 11.5–15.5)
WBC: 3.5 10*3/uL — ABNORMAL LOW (ref 4.0–10.5)
nRBC: 0 % (ref 0.0–0.2)

## 2024-03-24 LAB — FOLATE: Folate: 32.6 ng/mL (ref >5.9)

## 2024-03-24 LAB — COMPREHENSIVE METABOLIC PANEL WITH GFR
ALT: 32 U/L (ref 0–44)
AST: 40 U/L (ref 15–41)
Albumin: 3.6 g/dL (ref 3.5–5.0)
Alkaline Phosphatase: 166 U/L — ABNORMAL HIGH (ref 38–126)
Anion gap: 5 (ref 5–15)
BUN: 10 mg/dL (ref 8–23)
CO2: 28 mmol/L (ref 22–32)
Calcium: 9.1 mg/dL (ref 8.9–10.3)
Chloride: 102 mmol/L (ref 98–111)
Creatinine, Ser: 1.44 mg/dL — ABNORMAL HIGH (ref 0.61–1.24)
GFR, Estimated: 50 mL/min — ABNORMAL LOW (ref >60)
Glucose, Bld: 100 mg/dL — ABNORMAL HIGH (ref 70–99)
Potassium: 3.6 mmol/L (ref 3.5–5.1)
Sodium: 135 mmol/L (ref 135–145)
Total Bilirubin: 1.2 mg/dL (ref 0.0–1.2)
Total Protein: 6.5 g/dL (ref 6.5–8.1)

## 2024-03-24 LAB — IRON AND TIBC
Iron: 109 ug/dL (ref 45–182)
Saturation Ratios: 32 % (ref 17.9–39.5)
TIBC: 346 ug/dL (ref 250–450)
UIBC: 237 ug/dL

## 2024-03-24 LAB — LACTATE DEHYDROGENASE: LDH: 136 U/L (ref 98–192)

## 2024-03-24 LAB — VITAMIN B12: Vitamin B-12: 1816 pg/mL — ABNORMAL HIGH (ref 180–914)

## 2024-03-26 LAB — HOMOCYSTEINE: Homocysteine: 28.3 umol/L — ABNORMAL HIGH (ref 0.0–19.2)

## 2024-03-29 LAB — METHYLMALONIC ACID, SERUM: Methylmalonic Acid, Quantitative: 289 nmol/L (ref 0–378)

## 2024-04-02 NOTE — Progress Notes (Unsigned)
 [facility] [address], [place] [address]   CLINIC:  Medical Oncology/Hematology  PCP:  Omie Bickers, MD [address] [place] [address] [phone]   REASON FOR VISIT: Follow-up for pancytopenia + history of bilateral pulmonary embolism   CURRENT THERAPY: Eliquis  5 mg twice daily  INTERVAL HISTORY:  Mr. ABSALOM ARO is seen today for follow-up of thrombocytopenia and his history of pulmonary embolism.  He was last evaluated via telemedicine visit by Sheril Dines PA-C on 12/23/2023.   At today's visit, he reports feeling fatigued and overwhelmed in light of his recent moved to a new home. *** ***No recent hospitalizations, surgeries, or changes in baseline health status. *** He has 40***% energy and 40***% appetite. *** He is taking daily B12 and daily folic acid .***   THROMBOCYTOPENIA / PANCYTOPENIA:  He has easy bruising but no petechial rash. *** ***  He denies any hemoptysis, epistaxis, hematochezia, melena, or hematemesis.  ***He has mild night sweats and chills a few nights each week, but no fever.   ***He has not had any recent infections.     WEIGHT LOSS: During the stress of his recent move, he stopped drinking Ensure.***  Reports that he just "got sick of them," and they stopped having any appeal.  *** ***He has a hard time forcing himself to eat, and reports very poor appetite.  *** His weight today is 126 pounds (patient reported), which is down from 132 pounds in November 2024.  HISTORY OF DVT/PE:  He is compliant with Eliquis , and has not had any major bleeding events. *** *** He has not had any falls since he fell in May 2023.  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  ***He has chronic dyspnea on exertion.   ***No pleuritic chest pain or palpitations.  ASSESSMENT & PLAN:  1.  PANCYTOPENIA with macrocytosis -  Moderate thrombocytopenia and macrocytic anemia since November 2019.  Development of mild leukopenia since January 2023. - No known history of liver disease - CT abdomen/pelvis (12/10/2022) showed normal-sized spleen and unchanged calcification near the hepatic dome - Hematology workup revealed the following normal/negative results: TSH normal.  SPEP and hepatitis panel negative.  Reticulocytes 1.0 (hypoproliferative for degree of anemia).  LDH normal. - Initial workup showed B12 and folic acid  deficiency, but patient has had persistent pancytopenia despite nutritional correction.  (Taking B12 and folic acid  supplement since June 2023). - Admits to easy bruising, ***but denies any major bleeding episodes or petechial rash.  No bright red blood per rectum or melena.  No B symptoms.*** - Most recent labs (03/24/2024):  WBC 3.5/ANC 1.1, platelets 78.  Hgb 12.2/MCV 108.0. Ferritin 82, iron saturation 32% Vitamin B12 elevated at 1816, MMA normalized at 289.  Normal folate, but elevated homocystine 28.3. Creatinine 1.44/GFR 50. LDH normal. - Differential diagnosis favors early myelodysplasia.  May have some effects of nutritional deficiency and anemia of CKD.  Less likely to be immune mediated thrombocytopenia or neutropenia.    - PLAN: Suspicion remains high that patient has underlying MDS or other bone marrow infiltrative disorder. - Strongly recommended bone marrow biopsy, but patient continues to refuse.  We discussed that he may have MDS, and if so he has potential for leukemic transformation.  He continues to refuse bone marrow biopsy at this time. - *** Continue vitamin B12 supplement to daily, and continue  folic acid  supplement daily.*** - Repeat labs (CBC/D, CMP, LDH, ferritin, iron/TIBC) and RTC in 3-4*** months.  (Will recheck B12, MMA, and folate in 6 months, around November/December 2025)  2.  Bilateral pulmonary embolisms/right leg DVT, recurrent, unprovoked - Hospital admission from 10/15/2019  through 10/17/2019 with hemoptysis. - CT chest on 10/16/2019 showed acute bilateral pulmonary emboli greater within the left lower lobe with no evidence of right heart strain.  Dopplers showed no evidence of DVT. - Eliquis  was apparently discontinued in October 2021. - CT angio on 09/30/2020 shows acute PE in segmental and subsegmental branches supplying the right lower, right middle and left lower and left upper lobes. - He was found to have right leg DVT in November 2021 - " extensive right lower extremity DVT extending throughout femoral vein and deep femoral vein, through popliteal vein and into the calf" - Patient was started back on Eliquis  in November 2021 after diagnosis of recurrent PE/DVT - He remains on Eliquis  5 mg twice daily, which he is tolerating well*** - No major bleeding episodes such as bright blood per rectum or melena.*** - ED visit for fall on 03/14/2022.  He denies any falls since that time.*** - Patient does have some post thrombotic changes of right lower extremity (intermittent right lower extremity edema and right ankle varicose veins), but does not have any acute signs or symptoms concerning for recurrent DVT or PE at this time*** - PLAN: ***Continue indefinite Eliquis .  No bleeding issues reported. - We discussed increased risk of  fall-associated bleeding including hemorrhagic CVA.  Discussed fall safety and prevention at home.  Patient wishes to continue Eliquis  at this time, which is reasonable considering his high risk of recurrent VTE if he were to stop anticoagulation.   3.  Weight loss*** - Patient had previously lost 15-20 pounds since December 2021, secondary to poor appetite - Appetite improved, weight is stable/improved - LDCT chest (October 2023) did not show any signs of active malignancy - CT scans of head, chest, and pelvis from ED visit on 03/14/2022 negative for any signs of malignancy. - At today's visit (12/23/2023)***, patient reports 6 pound weight  loss since November 2024, which he attributes to decreased appetite and stopping Ensure. - PLAN: Strongly encouraged patient to restart Ensure and to try to increase caloric intake.  Recommended nutritional referral, but he declines dietitian evaluation at this time.    4.  Tobacco abuse - He has been smoking 1 PPD x50 years, continues to smoke 1 pack/day cigarettes*** - LDCT scan of chest (08/14/2022): Lung RADS 2S, benign appearance or behavior; recommended repeat LDCT chest in 12 months.  INCIDENTAL FINDINGS discussed with patient as below. Aortic atherosclerosis and LAD coronary artery disease. Findings consistent with COPD Probable left hydronephrosis, incompletely imaged.  (Note that mild left hydronephrosis was also seen on abdominal US  11/22/2021) - patient denies any urinary obstructive symptoms  - Most recent LDCT chest (09/11/2023): Lung RADS 2S, benign. - PLAN: Next LDCT chest due November 2025. - Recommend PCP follow-up for the above incidental findings    5.  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 - CT abdomen/pelvis (12/10/2022) showed normal-sized spleen and unchanged calcification near the hepatic dome - Patient reports history of "liver stones" in the 1980s that required hospitalization and surgical removal - He reports that he has had "problems with his  liver numbers" for the past 40+ years - Patient has not seen gastroenterology for "many years."  He was referred to gastroenterology following hospital discharge in January 2023. - Seen by gastroenterology for elevated bilirubin which was felt to be "most consistent with Gilbert's syndrome" per note by NP Karna Pacas on 01/07/2023   PLAN SUMMARY: >> Labs in 3*** months = CBC/D, CMP, LDH, ferritin, iron/TIBC >> OFFICE visit in 3*** months, 1 week  after labs  ** Patient requests afternoon appointments.     REVIEW OF SYSTEMS: ***  Review of Systems - Oncology   PHYSICAL EXAM:  ECOG PERFORMANCE STATUS: {CHL ONC ECOG ZO:[id number]} *** There were no vitals filed for this visit. There were no vitals filed for this visit. Physical Exam  PAST MEDICAL/SURGICAL HISTORY:  Past Medical History:  Diagnosis Date   Bursitis    l shoulder   Cataract    Elevated bilirubin    H/O: rheumatic fever    Kidney stones    Loss of teeth due to extraction    Migraines    Pulmonary embolism (HCC)    Rheumatic fever    Tendonitis    L shoulder   Past Surgical History:  Procedure Laterality Date   ABDOMINAL SURGERY     CATARACT EXTRACTION, BILATERAL     CHOLECYSTECTOMY     CRANIOTOMY Bilateral 09/06/2018   Procedure: BILATERAL CRANIOTOMIES FOR SUBDURAL HEMATOMA EVACUATION;  Surgeon: Manya Sells, MD;  [facility];  Service: Neurosurgery;  Laterality: Bilateral;   EYE SURGERY      SOCIAL HISTORY:  Social History   Socioeconomic History   Marital status: Widowed    Spouse name: Not on file   Number of children: Not on file   Years of education: Not on file   Highest education level: Not on file  Occupational History   Occupation: retired  Tobacco Use   Smoking status: Every Day    Current packs/day: 1.00    Average packs/day: 1 pack/day for 50.0 years (50.0 ttl pk-yrs)    Types: Cigarettes   Smokeless tobacco: Never  Vaping Use   Vaping status: Never Used  Substance and Sexual Activity   Alcohol  use: Yes    Comment: twice a week   Drug use: Never   Sexual activity: Not Currently  Other Topics Concern   Not on file  Social History Narrative   Not on file   Social Drivers of Health   Financial Resource Strain: Not on file  Food Insecurity: Not on file  Transportation Needs: Not on file  Physical Activity: Not on file  Stress: Not on file  Social Connections: Not on file  Intimate Partner Violence: Not on file     FAMILY HISTORY:  Family History  Problem Relation Age of Onset   Atrial fibrillation Mother    Macular degeneration Mother    Atrial fibrillation Father    Macular degeneration Father    Diabetes Brother    Seizures Brother    Pancreatic cancer Brother    Obesity Brother     CURRENT MEDICATIONS:  Outpatient Encounter Medications as of 04/04/2024  Medication Sig   apixaban  (ELIQUIS ) 5 MG TABS tablet Take 1 tablet (5 mg total) by mouth 2 (two) times daily.   Budeson-Glycopyrrol-Formoterol (BREZTRI AEROSPHERE) 160-9-4.8 MCG/ACT AERO Inhale into the lungs. prn   cyclobenzaprine  (FLEXERIL ) 10 MG tablet Take 10 mg by mouth every 8 (eight) hours as needed.   folic acid  (FOLVITE ) 400 MCG tablet Take 1 tablet (400  mcg total) by mouth daily.   HYDROcodone -acetaminophen  (NORCO/VICODIN) 5-325 MG tablet Take one half to one tab po q 6 hrs prn pain   ondansetron  (ZOFRAN ) 8 MG tablet Take 4 mg by mouth 2 (two) times daily.   ondansetron  (ZOFRAN -ODT) 4 MG disintegrating tablet Take 1 tablet (4 mg total) by mouth every 8 (eight) hours as needed.   OVER THE COUNTER MEDICATION Nerve Tonic occasionally   OVER THE COUNTER MEDICATION Hyland's Leg Cramps PM fast and Effective four times per week.   oxyCODONE -acetaminophen  (PERCOCET/ROXICET) 5-325 MG tablet Take 1 tablet by mouth every 6 (six) hours as needed for severe pain (pain score 7-10).   tamsulosin (FLOMAX) 0.4 MG CAPS capsule Take 0.4 mg by mouth daily.   vitamin B-12 (CYANOCOBALAMIN ) 500 MCG tablet Take 1 tablet (500 mcg total) by mouth daily. (Patient taking differently: Take 1,000 mcg by mouth daily.)   No facility-administered encounter medications on file as of 04/04/2024.    ALLERGIES:  Allergies  Allergen Reactions   Floxacillin (Flucloxacillin) Anaphylaxis    LABORATORY DATA:  I have reviewed the labs as listed.  CBC    Component Value Date/Time   WBC 3.5 (L) 03/24/2024 1337   RBC 3.36 (L) 03/24/2024 1337   HGB 12.2 (L)  03/24/2024 1337   HCT 36.3 (L) 03/24/2024 1337   PLT 78 (L) 03/24/2024 1337   MCV 108.0 (H) 03/24/2024 1337   MCH 36.3 (H) 03/24/2024 1337   MCHC 33.6 03/24/2024 1337   RDW 14.6 03/24/2024 1337   LYMPHSABS 1.8 03/24/2024 1337   MONOABS 0.4 03/24/2024 1337   EOSABS 0.1 03/24/2024 1337   BASOSABS 0.0 03/24/2024 1337      Latest Ref Rng & Units 03/24/2024    1:37 PM 12/17/2023    2:58 PM 09/21/2023    5:56 PM  CMP  Glucose 70 - 99 mg/dL 660  95  630   BUN 8 - 23 mg/dL 10  10  8    Creatinine 0.61 - 1.24 mg/dL 1.60  1.09  3.23   Sodium 135 - 145 mmol/L 135  137  138   Potassium 3.5 - 5.1 mmol/L 3.6  3.6  3.4   Chloride 98 - 111 mmol/L 102  103  102   CO2 22 - 32 mmol/L 28  28  28    Calcium 8.9 - 10.3 mg/dL 9.1  9.0  55.7   Total Protein 6.5 - 8.1 g/dL 6.5  6.4    Total Bilirubin 0.0 - 1.2 mg/dL 1.2  1.4    Alkaline Phos 38 - 126 U/L 166  97    AST 15 - 41 U/L 40  37    ALT 0 - 44 U/L 32  25      DIAGNOSTIC IMAGING:  I have independently reviewed the relevant imaging and discussed with the patient.   WRAP UP:  All questions were answered. The patient knows to call the clinic with any problems, questions or concerns.  Medical decision making: ***  Time spent on visit: I spent *** minutes counseling the patient face to face. The total time spent in the appointment was *** minutes and more than 50% was on counseling.  Sonnie Dusky, PA-C  ***

## 2024-04-04 ENCOUNTER — Inpatient Hospital Stay: Payer: Medicare Other | Attending: Hematology | Admitting: Physician Assistant

## 2024-04-04 DIAGNOSIS — Z87891 Personal history of nicotine dependence: Secondary | ICD-10-CM | POA: Diagnosis not present

## 2024-04-04 DIAGNOSIS — Z86718 Personal history of other venous thrombosis and embolism: Secondary | ICD-10-CM | POA: Diagnosis not present

## 2024-04-04 DIAGNOSIS — D61818 Other pancytopenia: Secondary | ICD-10-CM | POA: Diagnosis present

## 2024-04-04 DIAGNOSIS — I2694 Multiple subsegmental pulmonary emboli without acute cor pulmonale: Secondary | ICD-10-CM

## 2024-04-04 DIAGNOSIS — R7989 Other specified abnormal findings of blood chemistry: Secondary | ICD-10-CM | POA: Diagnosis not present

## 2024-04-04 DIAGNOSIS — Z122 Encounter for screening for malignant neoplasm of respiratory organs: Secondary | ICD-10-CM

## 2024-04-04 DIAGNOSIS — D539 Nutritional anemia, unspecified: Secondary | ICD-10-CM

## 2024-04-04 DIAGNOSIS — Z86711 Personal history of pulmonary embolism: Secondary | ICD-10-CM | POA: Diagnosis not present

## 2024-04-04 DIAGNOSIS — F17210 Nicotine dependence, cigarettes, uncomplicated: Secondary | ICD-10-CM | POA: Diagnosis not present

## 2024-04-04 DIAGNOSIS — Z7901 Long term (current) use of anticoagulants: Secondary | ICD-10-CM | POA: Insufficient documentation

## 2024-04-04 DIAGNOSIS — D696 Thrombocytopenia, unspecified: Secondary | ICD-10-CM

## 2024-04-04 DIAGNOSIS — E538 Deficiency of other specified B group vitamins: Secondary | ICD-10-CM

## 2024-04-04 NOTE — Patient Instructions (Signed)
 [facility] at [facility] Discharge Instructions  You were seen today by Sheril Dines PA-C for your low blood cells and your history of blood clots in your legs/lungs.  LOW BLOOD COUNTS: You have low levels of all three main types of blood cells in your body (red blood cells, platelets, white blood cells).  I strongly suspect that this may be caused by a type of bone marrow cancer called "MDS" or myelodysplastic syndrome.   I recommend BONE MARROW BIOPSY for further evaluation, but respect your choice to not have that test at this time. We will continue to monitor your blood counts closely, as MDS can sometimes turn into a serious type of blood cancer called Acute Myeloid Leukemia.  VITAMIN DEFICIENCIES:  Take B12 supplement every other day Start taking iron supplement (available over-the-counter) every other day. Continue daily folic acid .  HISTORY OF BLOOD CLOTS: Continue Eliquis  indefinitely for prevention of recurrent blood clots.  Seek immediate medical attention if you experience any signs of major bleeding events.  Be careful not to fall at home!  WEIGHT LOSS: Try to increase your caloric intake and drink 1-2 Boost beverage per day.  If you change your mind and would like to start an appetite stimulant or be evaluated by our nutritionist/dietitian, please let us  know.  FOLLOW-UP APPOINTMENT: Office visit in 4 months, with labs 1 week before  ** Thank you for trusting me with your healthcare!  I strive to provide all of my patients with quality care at each visit.  If you receive a survey for this visit, I would be so grateful to you for taking the time to provide feedback.  Thank you in advance!  ~ Ruchy Wildrick                   Dr. Paulett Boros   &   Sheril Dines, PA-C   - - - - - - - - - - - - - - - - - -     Thank you for choosing  Can[facility] Center at [facility] to provide your oncology and hematology care.  To afford each  patient quality time with our provider, please arrive at least 15 minutes before your scheduled appointment time.   If you have a lab appointment with the Cancer Center please come in thru the Main Entrance and check in at the main information desk.  You need to re-schedule your appointment should you arrive 10 or more minutes late.  We strive to give you quality time with our providers, and arriving late affects you and other patients whose appointments are after yours.  Also, if you no show three or more times for appointments you may be dismissed from the clinic at the providers discretion.     Again, thank you for choosing [facility].  Our hope is that these requests will decrease the amount of time that you wait before being seen by our physicians.       _____________________________________________________________  Should you have questions after your visit to [place], please contact our office at [phone] and follow the prompts.  Our office hours are 8:00 a.m. and 4:30 p.m. Monday - Friday.  Please note that voicemails left after 4:00 p.m. may not be returned until the following business day.  We are closed weekends and major holidays.  You do have access to a nurse 24-7, just call the main number to the clinic  [phone] and do not press any options, hold on the line and a nurse will answer the phone.    For prescription refill requests, have your pharmacy contact our office and allow 72 hours.    Due to Covid, you will need to wear a mask upon entering the hospital. If you do not have a mask, a mask will be given to you at the Main Entrance upon arrival. For doctor visits, patients may have 1 support person age 24 or older with them. For treatment visits, patients can not have anyone with them due to social distancing guidelines and our immunocompromised population.

## 2024-08-01 ENCOUNTER — Inpatient Hospital Stay: Attending: Hematology

## 2024-08-01 DIAGNOSIS — Z86718 Personal history of other venous thrombosis and embolism: Secondary | ICD-10-CM

## 2024-08-01 DIAGNOSIS — Z87891 Personal history of nicotine dependence: Secondary | ICD-10-CM

## 2024-08-01 DIAGNOSIS — Z7901 Long term (current) use of anticoagulants: Secondary | ICD-10-CM

## 2024-08-01 DIAGNOSIS — D539 Nutritional anemia, unspecified: Secondary | ICD-10-CM | POA: Insufficient documentation

## 2024-08-01 DIAGNOSIS — I2694 Multiple subsegmental pulmonary emboli without acute cor pulmonale: Secondary | ICD-10-CM

## 2024-08-01 DIAGNOSIS — E538 Deficiency of other specified B group vitamins: Secondary | ICD-10-CM

## 2024-08-01 DIAGNOSIS — D61818 Other pancytopenia: Secondary | ICD-10-CM | POA: Insufficient documentation

## 2024-08-01 DIAGNOSIS — Z122 Encounter for screening for malignant neoplasm of respiratory organs: Secondary | ICD-10-CM

## 2024-08-01 DIAGNOSIS — D696 Thrombocytopenia, unspecified: Secondary | ICD-10-CM

## 2024-08-01 LAB — CBC WITH DIFFERENTIAL/PLATELET
Abs Immature Granulocytes: 0.02 K/uL (ref 0.00–0.07)
Basophils Absolute: 0 K/uL (ref 0.0–0.1)
Basophils Relative: 1 %
Eosinophils Absolute: 0.1 K/uL (ref 0.0–0.5)
Eosinophils Relative: 1 %
HCT: 40.4 % (ref 39.0–52.0)
Hemoglobin: 13.7 g/dL (ref 13.0–17.0)
Immature Granulocytes: 0 %
Lymphocytes Relative: 33 %
Lymphs Abs: 1.8 K/uL (ref 0.7–4.0)
MCH: 36.7 pg — ABNORMAL HIGH (ref 26.0–34.0)
MCHC: 33.9 g/dL (ref 30.0–36.0)
MCV: 108.3 fL — ABNORMAL HIGH (ref 80.0–100.0)
Monocytes Absolute: 0.6 K/uL (ref 0.1–1.0)
Monocytes Relative: 10 %
Neutro Abs: 2.9 K/uL (ref 1.7–7.7)
Neutrophils Relative %: 55 %
Platelets: 64 K/uL — ABNORMAL LOW (ref 150–400)
RBC: 3.73 MIL/uL — ABNORMAL LOW (ref 4.22–5.81)
RDW: 14.5 % (ref 11.5–15.5)
WBC: 5.4 K/uL (ref 4.0–10.5)
nRBC: 0 % (ref 0.0–0.2)

## 2024-08-01 LAB — LACTATE DEHYDROGENASE: LDH: 124 U/L (ref 98–192)

## 2024-08-01 LAB — COMPREHENSIVE METABOLIC PANEL WITH GFR
ALT: 16 U/L (ref 0–44)
AST: 24 U/L (ref 15–41)
Albumin: 3.7 g/dL (ref 3.5–5.0)
Alkaline Phosphatase: 69 U/L (ref 38–126)
Anion gap: 12 (ref 5–15)
BUN: 12 mg/dL (ref 8–23)
CO2: 28 mmol/L (ref 22–32)
Calcium: 9.2 mg/dL (ref 8.9–10.3)
Chloride: 100 mmol/L (ref 98–111)
Creatinine, Ser: 1.36 mg/dL — ABNORMAL HIGH (ref 0.61–1.24)
GFR, Estimated: 53 mL/min — ABNORMAL LOW (ref >60)
Glucose, Bld: 116 mg/dL — ABNORMAL HIGH (ref 70–99)
Potassium: 3.5 mmol/L (ref 3.5–5.1)
Sodium: 140 mmol/L (ref 135–145)
Total Bilirubin: 2 mg/dL — ABNORMAL HIGH (ref 0.0–1.2)
Total Protein: 6.2 g/dL — ABNORMAL LOW (ref 6.5–8.1)

## 2024-08-01 LAB — FERRITIN: Ferritin: 72 ng/mL (ref 24–336)

## 2024-08-01 LAB — IRON AND TIBC
Iron: 158 ug/dL (ref 45–182)
Saturation Ratios: 47 % — ABNORMAL HIGH (ref 17.9–39.5)
TIBC: 339 ug/dL (ref 250–450)
UIBC: 181 ug/dL

## 2024-08-06 NOTE — Progress Notes (Unsigned)
 [facility] [address], [place] [address]   CLINIC:  Medical Oncology/Hematology  PCP:  Shona Norleen PEDLAR, MD [address] [place] [address] [phone]   REASON FOR VISIT: Follow-up for pancytopenia + history of bilateral pulmonary embolism   CURRENT THERAPY: Eliquis  5 mg twice daily  INTERVAL HISTORY:  Brian Stark is seen today for follow-up of thrombocytopenia and his history of pulmonary embolism.  He was last seen by Pleasant Barefoot PA-C on 04/04/2024.   At today's visit, he reports feeling fair.*** No recent hospitalizations, surgeries, or changes in baseline health status.*** He has 25***% energy and 25***% appetite. He is taking daily B12 and daily folic acid .***  Started iron after last visit?  ***   THROMBOCYTOPENIA / PANCYTOPENIA: He has easy bruising but no petechial rash. *** He denies any hemoptysis, epistaxis, hematochezia, melena, or hematemesis. *** He has occasional mild night sweats and very slight chills, but no fever.  *** He has not had any recent infections.   ***  WEIGHT LOSS: He is drinking Boost every other day, but unable to afford daily Boost.*** He has a hard time forcing himself to eat, and reports very poor appetite - eats about one meal each day, about 75***% of a full dinner plate. His weight today is *** pounds, which is ***.  HISTORY OF DVT/PE: He is compliant with Eliquis , and has not had any major bleeding events. *** He has not had any falls since he fell in May 2023. 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 *** He has chronic dyspnea on exertion.  *** No pleuritic chest pain or palpitations.***  ASSESSMENT & PLAN:  1.  PANCYTOPENIA with macrocytosis - Moderate thrombocytopenia and macrocytic anemia since November 2019.  Development of mild leukopenia since January 2023. - No known  history of liver disease - CT abdomen/pelvis (12/10/2022) showed normal-sized spleen and unchanged calcification near the hepatic dome - Hematology workup revealed the following normal/negative results: TSH normal.  SPEP and hepatitis panel negative.  Reticulocytes 1.0 (hypoproliferative for degree of anemia).  LDH normal. - Initial workup showed B12 and folic acid  deficiency, but patient has had persistent pancytopenia despite nutritional correction.  (Taking B12 and folic acid  supplement since June 2023). - Admits to easy bruising, but denies any major bleeding episodes or petechial rash.  No bright red blood per rectum or melena.  Intermittent mild chills and night sweats.*** - Most recent labs (08/01/2024):  WBC 5.4/ANC 2.9, platelets 64.  Hgb 13.7/MCV 108.3. Ferritin 72, iron saturation 47% Creatinine 1.36/GFR 53 (baseline CKD stage IIIa).  Normal LDH. (Labs from May 2025 showed Vitamin B12 elevated at 1816, MMA normalized at 289.  Normal folate, but elevated homocystine 28.3.) - Differential diagnosis favors early myelodysplasia.  May have some effects of nutritional deficiency and anemia of CKD.  Less likely to be immune mediated thrombocytopenia or neutropenia.    - PLAN: Suspicion remains high that patient has underlying MDS or other bone marrow infiltrative disorder. - Strongly recommended bone marrow biopsy, but patient continues to refuse.  We discussed that he may have MDS, and if so he has potential for leukemic transformation.  He continues to refuse bone marrow biopsy at this time.*** (In the patient's own words, he would rather take his chances with cancer then the risk the pain and possible adverse effects of bone marrow biopsy.  He also states that even if it was cancer, he would not want any treatment after having watched his wife go through chemotherapy for breast cancer.) - Continue vitamin B12 supplement (okay to decrease to every other day due to patient concern about elevated  B12 levels), and continue folic acid  supplement daily.*** - Start*** taking iron supplement every other day.*** - Repeat labs (CBC/D, CMP, LDH, ferritin, iron/TIBC, B12, MMA, folate) and RTC in 4 months.  2.  Bilateral pulmonary embolisms/right leg DVT, recurrent, unprovoked - Hospital admission from 10/15/2019 through 10/17/2019 with hemoptysis. - CT chest on 10/16/2019 showed acute bilateral pulmonary emboli greater within the left lower lobe with no evidence of right heart strain.  Dopplers showed no evidence of DVT. - Eliquis  was apparently discontinued in October 2021. - CT angio on 09/30/2020 shows acute PE in segmental and subsegmental branches supplying the right lower, right middle and left lower and left upper lobes. - He was found to have right leg DVT in November 2021 -  extensive right lower extremity DVT extending throughout femoral vein and deep femoral vein, through popliteal vein and into the calf - Patient was started back on Eliquis  in November 2021 after diagnosis of recurrent PE/DVT - He remains on Eliquis  5 mg twice daily, which he is tolerating well - No major bleeding episodes such as bright blood per rectum or melena.*** - ED visit for fall on 03/14/2022.  He denies any falls since that time.*** - Patient does have some post thrombotic changes of right lower extremity (intermittent right lower extremity edema and right ankle varicose veins), but does not have any acute signs or symptoms concerning for recurrent DVT or PE at this time - PLAN: Continue indefinite Eliquis .  No bleeding issues reported.*** - We discussed increased risk of  fall-associated bleeding including hemorrhagic CVA.  Discussed fall safety and prevention at home.  Patient wishes to continue Eliquis  at this time, which is reasonable considering his high risk of recurrent VTE if he were to stop anticoagulation.   3.  Weight loss - LDCT chest (November 2024) did not show any signs of active malignancy -  CT renal stone study (09/22/2023) negative for any enlarged abdominal or pelvic lymph nodes, or any evidence of mass/malignancy - At today's visit (***), patient noted to have 6 pound weight loss since November 2024, which he attributes to decreased appetite and stopping Ensure.  Weight today is 126 pounds.   - PLAN: Strongly encouraged patient to restart Ensure and to try to increase caloric intake.*** - Offered appetite stimulant, which he declined. - Recommended nutritional referral, but he refuses dietitian evaluation at this time.    4.  Tobacco abuse - He has been smoking 1 PPD x50 years, continues to smoke 1 pack/day cigarettes - LDCT scan of chest (08/14/2022): Lung RADS 2S, benign appearance or behavior; recommended repeat LDCT chest in 12 months.  INCIDENTAL FINDINGS discussed with patient as below. Aortic atherosclerosis and LAD coronary artery disease. Findings consistent with COPD Probable left hydronephrosis, incompletely imaged.  (Note that mild left hydronephrosis was also seen on abdominal US  11/22/2021) - patient denies any urinary obstructive symptoms  - Most recent LDCT chest (09/11/2023): Lung RADS 2S, benign. - PLAN: Next LDCT chest due November 2025. - Recommend PCP follow-up for the above incidental findings    5.  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 - CT abdomen/pelvis (12/10/2022) showed normal-sized spleen and unchanged calcification near the hepatic dome - Patient reports history of liver stones in the 1980s that required hospitalization and surgical removal - He reports that he has had problems with his liver numbers for the past 40+ years - Patient has not seen gastroenterology for many years.  He was referred to gastroenterology following hospital discharge in January 2023. -  Seen by gastroenterology for elevated bilirubin which was felt to be most consistent with Gilbert's syndrome per note by NP Therisa Stager on 01/07/2023   PLAN SUMMARY: *** Review *** >> LDCT chest due November 2025 >> Labs in 4 months = CBC/D, CMP, LDH, ferritin, iron/TIBC, B12, MMA, folate >> OFFICE visit in 4 months, 1 week after labs ** Patient requests afternoon appointments.     REVIEW OF SYSTEMS:***  Review of Systems  Constitutional:  Positive for fatigue. Negative for appetite change, chills, diaphoresis, fever and unexpected weight change.  HENT:   Negative for lump/mass and nosebleeds.   Eyes:  Negative for eye problems.  Respiratory:  Positive for shortness of breath (with exertion). Negative for cough and hemoptysis.   Cardiovascular:  Negative for chest pain, leg swelling and palpitations.  Gastrointestinal:  Positive for constipation and nausea. Negative for abdominal pain, blood in stool, diarrhea and vomiting.  Genitourinary:  Negative for hematuria.   Skin: Negative.   Neurological:  Positive for dizziness and headaches. Negative for light-headedness.  Hematological:  Does not bruise/bleed easily.  Psychiatric/Behavioral:  Positive for sleep disturbance.      PHYSICAL EXAM:***  ECOG PERFORMANCE STATUS: 2 - Symptomatic, <50% confined to bed  There were no vitals filed for this visit.  There were no vitals filed for this visit.  Physical Exam Constitutional:      Appearance: Normal appearance. He is cachectic.     Comments: Significant muscle wasting  Cardiovascular:     Rate and Rhythm: Normal rate and regular rhythm.     Pulses: Normal pulses.     Heart sounds: Normal heart sounds.  Pulmonary:     Effort: Pulmonary effort is normal.     Breath sounds: Normal breath sounds. Decreased air movement present.  Musculoskeletal:        General: No swelling.     Right lower leg: No edema.     Left lower leg: No edema.  Skin:    General: Skin is warm and dry.   Neurological:     General: No focal deficit present.     Mental Status: He is alert and oriented to person, place, and time.  Psychiatric:        Mood and Affect: Mood normal.        Behavior: Behavior normal.     PAST MEDICAL/SURGICAL HISTORY:  Past Medical History:  Diagnosis Date   Bursitis    l shoulder   Cataract    Elevated bilirubin    H/O: rheumatic fever    Kidney stones    Loss of teeth due to extraction    Migraines    Pulmonary embolism (HCC)    Rheumatic fever    Tendonitis    L shoulder   Past Surgical History:  Procedure Laterality Date   ABDOMINAL SURGERY     CATARACT EXTRACTION, BILATERAL     CHOLECYSTECTOMY     CRANIOTOMY Bilateral 09/06/2018   Procedure: BILATERAL CRANIOTOMIES FOR SUBDURAL HEMATOMA EVACUATION;  Surgeon: Unice Pac, MD;  Location[facility];  Service: Neurosurgery;  Laterality: Bilateral;   EYE SURGERY      SOCIAL HISTORY:  Social History   Socioeconomic History   Marital status: Widowed    Spouse name: Not on file   Number of children: Not on file   Years of education: Not on file   Highest education level: Not on file  Occupational History   Occupation: retired  Tobacco Use   Smoking status: Every Day    Current packs/day: 1.00    Average packs/day: 1 pack/day for 50.0 years (50.0 ttl pk-yrs)    Types: Cigarettes   Smokeless tobacco: Never  Vaping Use   Vaping status: Never Used  Substance and Sexual Activity   Alcohol  use: Yes    Comment: twice a week   Drug use: Never   Sexual activity: Not Currently  Other Topics Concern   Not on file  Social History Narrative   Not on file   Social Drivers of Health   Financial Resource Strain: Not on file  Food Insecurity: Not on file  Transportation Needs: Not on file  Physical Activity: Not on file  Stress: Not on file  Social Connections: Not on file  Intimate Partner Violence: Not on file    FAMILY HISTORY:  Family History  Problem Relation Age of Onset    Atrial fibrillation Mother    Macular degeneration Mother    Atrial fibrillation Father    Macular degeneration Father    Diabetes Brother    Seizures Brother    Pancreatic cancer Brother    Obesity Brother     CURRENT MEDICATIONS:  Outpatient Encounter Medications as of 08/08/2024  Medication Sig   apixaban  (ELIQUIS ) 5 MG TABS tablet Take 1 tablet (5 mg total) by mouth 2 (two) times daily.   Budeson-Glycopyrrol-Formoterol (BREZTRI AEROSPHERE) 160-9-4.8 MCG/ACT AERO Inhale into the lungs. prn   cyclobenzaprine  (FLEXERIL ) 10 MG tablet Take 10 mg by mouth every 8 (eight) hours as needed.   folic acid  (FOLVITE ) 400 MCG tablet Take 1 tablet (400 mcg total) by mouth daily.   HYDROcodone -acetaminophen  (NORCO/VICODIN) 5-325 MG tablet Take one half to one tab po q 6 hrs prn pain   ondansetron  (ZOFRAN ) 8 MG tablet Take 4 mg by mouth 2 (two) times daily.   OVER THE COUNTER MEDICATION Nerve Tonic occasionally   OVER THE COUNTER MEDICATION Hyland's Leg Cramps PM fast and Effective four times per week.   vitamin B-12 (CYANOCOBALAMIN ) 500 MCG tablet Take 1 tablet (500 mcg total) by mouth daily. (Patient taking differently: Take 1,000 mcg by mouth daily.)   No facility-administered encounter medications on file as of 08/08/2024.    ALLERGIES:  Allergies  Allergen Reactions   Floxacillin (Flucloxacillin) Anaphylaxis   Penicillins Anaphylaxis    LABORATORY DATA:  I have reviewed the labs as listed.  CBC    Component Value Date/Time   WBC 5.4 08/01/2024 1424   RBC 3.73 (L) 08/01/2024 1424   HGB 13.7 08/01/2024 1424   HCT 40.4 08/01/2024 1424   PLT 64 (L) 08/01/2024 1424   MCV 108.3 (H) 08/01/2024 1424   MCH 36.7 (H) 08/01/2024 1424   MCHC 33.9 08/01/2024 1424   RDW 14.5 08/01/2024 1424   LYMPHSABS 1.8 08/01/2024 1424   MONOABS 0.6 08/01/2024 1424   EOSABS 0.1 08/01/2024 1424   BASOSABS 0.0 08/01/2024 1424      Latest Ref Rng & Units 08/01/2024    2:24 PM 03/24/2024    1:37 PM  12/17/2023    2:58 PM  CMP  Glucose 70 - 99 mg/dL 883  899  95   BUN 8 - 23 mg/dL 12  10  10    Creatinine 0.61 - 1.24 mg/dL 8.63  8.55  8.58   Sodium 135 - 145 mmol/L 140  135  137   Potassium 3.5 - 5.1 mmol/L 3.5  3.6  3.6   Chloride 98 - 111 mmol/L 100  102  103   CO2 22 - 32 mmol/L 28  28  28    Calcium 8.9 - 10.3 mg/dL 9.2  9.1  9.0   Total Protein 6.5 - 8.1 g/dL 6.2  6.5  6.4   Total Bilirubin 0.0 - 1.2 mg/dL 2.0  1.2  1.4   Alkaline Phos 38 - 126 U/L 69  166  97   AST 15 - 41 U/L 24  40  37   ALT 0 - 44 U/L 16  32  25     DIAGNOSTIC IMAGING:  I have independently reviewed the relevant imaging and discussed with the patient.   WRAP UP:  All questions were answered. The patient knows to call the clinic with any problems, questions or concerns.  Medical decision making: Moderate  Time spent on visit: I spent 20 minutes counseling the patient face to face. The total time spent in the appointment was 30 minutes and more than 50% was on counseling.  Pleasant CHRISTELLA Barefoot, PA-C  ***

## 2024-08-08 ENCOUNTER — Inpatient Hospital Stay: Attending: Hematology | Admitting: Physician Assistant

## 2024-08-08 VITALS — BP 124/69 | HR 88 | Temp 98.3°F | Resp 18 | Wt 121.5 lb

## 2024-08-08 DIAGNOSIS — R634 Abnormal weight loss: Secondary | ICD-10-CM | POA: Insufficient documentation

## 2024-08-08 DIAGNOSIS — Z87891 Personal history of nicotine dependence: Secondary | ICD-10-CM | POA: Diagnosis not present

## 2024-08-08 DIAGNOSIS — E46 Unspecified protein-calorie malnutrition: Secondary | ICD-10-CM | POA: Diagnosis not present

## 2024-08-08 DIAGNOSIS — D61818 Other pancytopenia: Secondary | ICD-10-CM | POA: Insufficient documentation

## 2024-08-08 DIAGNOSIS — F17210 Nicotine dependence, cigarettes, uncomplicated: Secondary | ICD-10-CM | POA: Diagnosis not present

## 2024-08-08 DIAGNOSIS — R6883 Chills (without fever): Secondary | ICD-10-CM | POA: Insufficient documentation

## 2024-08-08 DIAGNOSIS — Z681 Body mass index (BMI) 19 or less, adult: Secondary | ICD-10-CM | POA: Insufficient documentation

## 2024-08-08 DIAGNOSIS — D539 Nutritional anemia, unspecified: Secondary | ICD-10-CM | POA: Diagnosis not present

## 2024-08-08 DIAGNOSIS — Z79899 Other long term (current) drug therapy: Secondary | ICD-10-CM | POA: Insufficient documentation

## 2024-08-08 DIAGNOSIS — R61 Generalized hyperhidrosis: Secondary | ICD-10-CM | POA: Diagnosis not present

## 2024-08-08 DIAGNOSIS — E538 Deficiency of other specified B group vitamins: Secondary | ICD-10-CM | POA: Diagnosis not present

## 2024-08-08 DIAGNOSIS — Z86711 Personal history of pulmonary embolism: Secondary | ICD-10-CM | POA: Diagnosis not present

## 2024-08-08 DIAGNOSIS — Z86718 Personal history of other venous thrombosis and embolism: Secondary | ICD-10-CM | POA: Diagnosis not present

## 2024-08-08 DIAGNOSIS — K7689 Other specified diseases of liver: Secondary | ICD-10-CM | POA: Diagnosis not present

## 2024-08-08 DIAGNOSIS — Z7901 Long term (current) use of anticoagulants: Secondary | ICD-10-CM | POA: Diagnosis not present

## 2024-08-08 MED ORDER — MIRTAZAPINE 15 MG PO TABS: 15.0000 mg | ORAL_TABLET | Freq: Every day | ORAL | 3 refills | Status: DC

## 2024-08-08 NOTE — Patient Instructions (Addendum)
 [facility] at [facility] Discharge Instructions  You were seen today by Pleasant Barefoot PA-C for your low blood cells and your history of blood clots in your legs/lungs.  LOW BLOOD COUNTS: You have low levels of all three main types of blood cells in your body (red blood cells, platelets, white blood cells).  I strongly suspect that this may be caused by a type of bone marrow cancer called MDS or myelodysplastic syndrome.   I recommend BONE MARROW BIOPSY for further evaluation, but respect your choice to not have that test at this time. We will continue to monitor your blood counts closely, as MDS can sometimes turn into a serious type of blood cancer called Acute Myeloid Leukemia.  VITAMIN DEFICIENCIES:  Take B12 supplement every other day Start taking iron supplement (available over-the-counter) every other day. Continue daily folic acid .  HISTORY OF BLOOD CLOTS: Continue Eliquis  indefinitely for prevention of recurrent blood clots.  Seek immediate medical attention if you experience any signs of major bleeding events.  Be careful not to fall at home!  WEIGHT LOSS: Due to your ongoing weight loss, we will schedule you for PET scan to make sure you do not have any malignancies based on previous CT scans. - Prescription will be sent to your pharmacy for mirtazapine 15 mg.  Take this each night at bedtime as an appetite stimulant.  (Common side effects include sedation/sleepiness, dry mouth, and constipation.) - We have referred you to see our dietitian for further assistance with nutrition and weight gain.  FOLLOW-UP APPOINTMENT: Unless you have any major abnormalities on your PET scan, we will plan on office visit in 4 months, with labs 1 week before  ** Thank you for trusting me with your healthcare!  I strive to provide all of my patients with quality care at each visit.  If you receive a survey for this visit, I would be so grateful to you for taking the time to  provide feedback.  Thank you in advance!  ~ Zoria Rawlinson                                        Dr. Mickiel Davonna Pleasant Barefoot, PA-C     Delon Hope, NP   - - - - - - - - - - - - - - - - - -      Thank you for choosing  [facility] at [facility] to provide your oncology and hematology care.  To afford each patient quality time with our provider, please arrive at least 15 minutes before your scheduled appointment time.   If you have a lab appointment with the Cancer Center please come in thru the Main Entrance and check in at the main information desk.  You need to re-schedule your appointment should you arrive 10 or more minutes late.  We strive to give you quality time with our providers, and arriving late affects you and other patients whose appointments are after yours.  Also, if you no show three or more times for appointments you may be dismissed from the clinic at the providers discretion.     Again, thank you for choosing [facility].  Our hope is that these requests will decrease the amount of time that you wait before being seen by our physicians.  _____________________________________________________________  Should you have questions after your visit to [facility], please contact our office at [phone] and follow the prompts.  Our office hours are 8:00 a.m. and 4:30 p.m. Monday - Friday.  Please note that voicemails left after 4:00 p.m. may not be returned until the following business day.  We are closed weekends and major holidays.  You do have access to a nurse 24-7, just call the main number to the clinic [phone] and do not press any options, hold on the line and a nurse will answer the phone.    For prescription refill requests, have your pharmacy contact our office and allow 72 hours.    Due to Covid, you will need to wear a mask upon entering the hospital. If you do not have a mask, a mask will be given  to you at the Main Entrance upon arrival. For doctor visits, patients may have 1 support person age 6 or older with them. For treatment visits, patients can not have anyone with them due to social distancing guidelines and our immunocompromised population.

## 2024-08-15 ENCOUNTER — Inpatient Hospital Stay: Admitting: Dietician

## 2024-08-18 ENCOUNTER — Encounter (HOSPITAL_COMMUNITY)
Admission: RE | Admit: 2024-08-18 | Discharge: 2024-08-18 | Disposition: A | Discharge status: Home / Self Care | Source: Ambulatory Visit | Attending: Physician Assistant | Admitting: Physician Assistant

## 2024-08-18 DIAGNOSIS — R634 Abnormal weight loss: Secondary | ICD-10-CM | POA: Diagnosis present

## 2024-08-18 DIAGNOSIS — N200 Calculus of kidney: Secondary | ICD-10-CM | POA: Insufficient documentation

## 2024-08-18 DIAGNOSIS — R9389 Abnormal findings on diagnostic imaging of other specified body structures: Secondary | ICD-10-CM | POA: Insufficient documentation

## 2024-08-18 DIAGNOSIS — N210 Calculus in bladder: Secondary | ICD-10-CM | POA: Insufficient documentation

## 2024-08-18 DIAGNOSIS — N261 Atrophy of kidney (terminal): Secondary | ICD-10-CM | POA: Insufficient documentation

## 2024-08-18 MED ORDER — FLUDEOXYGLUCOSE F - 18 (FDG) INJECTION
6.1200 | Freq: Once | INTRAVENOUS | Status: AC | PRN
  Administered 2024-08-18: 6.12 via INTRAVENOUS

## 2024-08-24 ENCOUNTER — Ambulatory Visit: Payer: Self-pay | Admitting: Physician Assistant

## 2024-08-24 DIAGNOSIS — N400 Enlarged prostate without lower urinary tract symptoms: Secondary | ICD-10-CM

## 2024-08-24 NOTE — Progress Notes (Addendum)
 PET scan obtained on 08/18/2024 due to patient with significant unintentional weight loss and suspected occult malignancy.  There was focal mild hypermetabolic activity in the central prostate gland, prostate carcinoma cannot be excluded.  No other potential sites of malignancy were identified. (Other results included 6 mm nonobstructing left renal calculus and 7 mm bladder calculus.  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  No ureteral calculi or dilatation.)  Per discussion with Dr. Davonna, we will check PSA and refer to urology for possible prostate cancer. As he did not have any other potential sites of malignancy or evidence of metastases, I do not suspect any malignant cause of his weight loss.  Severe weight loss likely due to poor appetite and GI issues, and he should continue to follow with PCP and other specialties as appropriate.  Regarding renal calculi, atrophy, and pelvicaliectasis, he was previously seen for this by Dr. Matilda (urology) on 03/10/2023.  No additional follow-up was recommended at that time.  Called and discussed these results and plan with patient on 08/24/2024.  He verbalizes understanding and agreement with the above.  Pleasant CHRISTELLA Barefoot, PA-C 08/24/24 12:02 PM   #ADDENDUM (08/29/24): PSA from 08/25/2024 was normal at 1.13.  Per message from Dr. Matilda (urology), he reviewed images himself and suspected that PET scan activity was actually bladder and not prostate.  He did not see any increased uptake in prostate, and found PSA level to be reassuring.  Dr. Matilda has called patient and reassured him that there is no evidence of current prostate cancer at this time.

## 2024-08-25 ENCOUNTER — Inpatient Hospital Stay

## 2024-08-25 DIAGNOSIS — N400 Enlarged prostate without lower urinary tract symptoms: Secondary | ICD-10-CM

## 2024-08-25 DIAGNOSIS — D61818 Other pancytopenia: Secondary | ICD-10-CM | POA: Diagnosis not present

## 2024-08-25 LAB — PSA: Prostatic Specific Antigen: 1.13 ng/mL (ref 0.00–4.00)

## 2024-08-31 ENCOUNTER — Other Ambulatory Visit: Payer: Self-pay | Admitting: Physician Assistant

## 2024-08-31 DIAGNOSIS — R634 Abnormal weight loss: Secondary | ICD-10-CM

## 2024-08-31 NOTE — Telephone Encounter (Signed)
 SABRA

## 2024-08-31 NOTE — Telephone Encounter (Signed)
 Pharmacy requested a 90 day supply.  New prescription sent.

## 2024-10-25 ENCOUNTER — Ambulatory Visit: Admitting: Urology

## 2024-10-31 ENCOUNTER — Encounter: Payer: Self-pay | Admitting: *Deleted

## 2024-12-04 ENCOUNTER — Encounter: Payer: Self-pay | Admitting: *Deleted

## 2024-12-05 ENCOUNTER — Inpatient Hospital Stay

## 2024-12-12 ENCOUNTER — Inpatient Hospital Stay: Admitting: Physician Assistant
